# Patient Record
Sex: Male | Born: 1935 | Marital: Married | Smoking: Never smoker
Health system: Southern US, Community
[De-identification: ages and names within clinical notes are randomized; demographics above are authoritative.]

---

## 2019-05-20 ENCOUNTER — Other Ambulatory Visit: Payer: Self-pay | Admitting: General Practice

## 2019-05-20 NOTE — Patient Outreach (Signed)
Client is newly enrolled in the Special Needs Plan program with Type II Diabetes. Individualized Care Plan (ICP) completed with information from the Health Risk Assessment on file, no record results found in this EMR or other sources. Client unable to be reached with three telephonic attempts. Client also has a history of Atrial Fibrillation and Hypertension. Client risk assessment reports no ED or acute inpatient visits recently. Will send an introductory letter with ICP to the primary provider and client, along with educational materials. Assigned RN Care Coordinator will follow up within 3 months.

## 2019-07-16 ENCOUNTER — Other Ambulatory Visit: Payer: Self-pay | Admitting: *Deleted

## 2019-07-16 NOTE — Patient Outreach (Signed)
  [facility]) Care Management Chronic Special Needs Program    07/16/2019  Name: Andre Adams, DOB: 1933-08-10  MRN: [id number]   Mr. Bryton Romagnoli is enrolled in a chronic special needs plan for Diabetes.  Outreach call to client for initial telephone assessment, no answer to telephone, left voicemail requestsing return phone call.   PLAN Outreach client in 2-3 weeks  Irving Shows [facility], BSN [facility] Care Coordinator [phone]

## 2019-07-23 ENCOUNTER — Other Ambulatory Visit: Payer: Self-pay | Admitting: *Deleted

## 2019-07-23 NOTE — Patient Outreach (Signed)
  [facility]) Care Management Chronic Special Needs Program    07/23/2019  Name: Andre Adams, DOB: Aug 21, 1933  MRN: [id number]   Mr. Andre Adams is enrolled in a chronic special needs plan for Diabetes.  Outreach call to client for initial telephone assessment/ 2nd attempt, no answer to telephone, left voicemail requesting return phone call.  PLAN Outreach client within 2-3 weeks  Irving Shows [facility], BSN [facility] RN Care Coordinator, CSNP [phone]

## 2019-07-25 ENCOUNTER — Other Ambulatory Visit: Payer: Self-pay | Admitting: *Deleted

## 2019-07-25 NOTE — Patient Outreach (Signed)
  [facility]) Care Management Chronic Special Needs Program    07/25/2019  Name: Andre Adams, DOB: 1933/11/08  MRN: [id number]   Mr. Andre Adams is enrolled in a chronic special needs plan for Diabetes.  Outreach call to client for initial telephone assessment/  3rd attempt, no answer to telephone, left voicemail requesting return phone call.  RN care manager mailed unsuccessful outreach letter to client's home.  PLAN Outreach client in 6-9 months  Andre Adams [facility], BSN [facility] RN Care Coordinator, CSNP [phone]

## 2019-09-24 ENCOUNTER — Other Ambulatory Visit: Payer: Self-pay | Admitting: *Deleted

## 2019-09-24 NOTE — Patient Outreach (Signed)
  [facility]) Care Management Chronic Special Needs Program    09/24/2019  Name: Andre Adams, DOB: [birth year]  MRN: [id number]   [name] is enrolled in a chronic special needs plan for Diabetes.  RN care manager received notification client is admitted to [facility] on 09/23/19 with unspecified injury of left wrist, hand, finger(s).  Per policy and procedure, individualized care plan sent to [facility] utilization management department.  Goals    . Client understands the importance of follow-up with providers by attending scheduled visits    . Client will report no worsening of symptoms of Atrial Fibrillation within the next months     RN provided [employer] Education on "Living with Atrial Fibrillation". Review Atrial Fibrillation action plan in the [employer] education calendar you received in the mail. Review signs, symptoms and monitoring for worsening Atrial Fibrillation. Review importance of taking anticoagulant medication (Coumadin) to prevent stroke.  Call your doctor if you have feelings of fast or irregular heartbeats.     . Client will use Assistive Devices as needed and verbalize understanding of device use     Continue to use your cane or walker as needed for stability while walking. RN provided [employer] on "Preventing Falls in the Older Adult". Report any falls you have to your doctor or RN with telephone calls.     . Client will verbalize knowledge of self management of Hypertension as evidences by BP reading of 140/90 or less; or as defined by provider     RN provided [facility] Education on "Diabetes and Blood Pressure". Check your blood pressure on a regular basis. Notify the RN if you need a new B/P monitor.    Marland Kitchen HEMOGLOBIN A1C < 7.0    . Maintain timely refills of diabetic medication as prescribed within the year .    Marland Kitchen Obtain annual   Lipid Profile, LDL-C    . Obtain Annual Eye (retinal)  Exam     . Obtain Annual Foot Exam    . Obtain annual screen for micro albuminuria (urine) , nephropathy (kidney problems)    . Obtain Hemoglobin A1C at least 2 times per year    . Visit Primary Care Provider or Endocrinologist at least 2 times per year        PLAN RN care manager will notify [facility] care management hospital liason of client's admission and request follow up on client's discharge disposition.  RN care manager will continue to follow as client's CSNP care management coordinator.    Irving Shows [facility], BSN [facility] RN Care Coordinator, CSNP [phone]

## 2019-09-30 ENCOUNTER — Other Ambulatory Visit: Payer: Self-pay | Admitting: *Deleted

## 2019-09-30 NOTE — Patient Outreach (Signed)
  [facility]) Care Management Chronic Special Needs Program  09/30/2019  Name: Andre Adams DOB: Feb 27, 1933  MRN: [id number]  Mr. Andre Adams is enrolled in a chronic special needs plan for Diabetes. Reviewed and updated care plan.  Subjective: Client hospitalized 09/23/19-09/29/19 at [facility] with acute fractures bil pubic bones, left acetablulum and left distal radius, client had confusion (most probable vascular). Recommendation from PT/ OT client discharge to skilled nursing facility for rehab.  Goals Addressed            This Visit's Progress   . General - Client will not be readmitted within 30 days (C-SNP)       Please follow discharge instructions  Call provider if you have any questions Attend all follow up appointments as scheduled Take medications as prescribed Call 24 hour nurse advice line as needed at [phone]       Plan:    RN care manager faxed today's note and updated individualized care plan to primary care provider, mailed updated individualized care plan to client.  Chronic care management coordinator will outreach in:  When client discharges from skilled nursing facility.  Audrie Gallus  [facility] Case Manager, C-SNP  [phone]  .

## 2019-10-03 ENCOUNTER — Other Ambulatory Visit: Payer: Self-pay

## 2019-10-03 NOTE — Patient Outreach (Signed)
°  [facility]) Care Management Chronic Special Needs Program    10/03/2019  Name: Andre Adams, DOB: 03-10-1933  MRN: [id number]   Mr. Andre Adams is enrolled in a chronic special needs plan for Diabetes.  Client admitted to [facility] on 10/03/19 with dx of GI bleed. Individualized care plan sent to [facility] hospital for admission  Assigned RNCM will continue to follow post discharge.  Dudley Major RN, Maximiano Coss, CDE Chronic Care Management Coordinator [facility] Care Management [phone]

## 2019-10-08 ENCOUNTER — Other Ambulatory Visit: Payer: Self-pay | Admitting: *Deleted

## 2019-10-08 NOTE — Patient Outreach (Signed)
  [facility]) Care Management Chronic Special Needs Program   10/08/2019  Name: Danna Casella, DOB: [birth year]  MRN: [id number]  The client was discussed in today's interdisciplinary care team meeting.  The following issues were discussed:  Client's needs, changes in health status, key risk triggers/ risk stratification, care plan, coordination of care and care transition.   Participants present:                                    Livia Snellen BSN, MS, CCM                                     Davina Green BSN, CCM                                     Melissa Sandlin BSN, CCM, CDE                                     Kathyrn Sheriff BSN, MSN, CCM                                     Iverson Alamin CBCC/ CMAA                                     Dr. Abner Greenspan                                     Shara Blazing RN MSN-[facility]                                     Dessa Phi RN- [facility]                                     Caryn Bee Ruedinger  Pharm D HTA                                     Mosie Lukes CDE, Health Coach HTA  Recommendations:  None  Plan:  Continue to follow/ Tier 2  Follow-up: Upon discharge from skilled nursing facility  Irving Shows [facility], BSN [facility] RN Care Coordinator, CSNP [phone]

## 2019-10-10 ENCOUNTER — Other Ambulatory Visit: Payer: Self-pay | Admitting: *Deleted

## 2019-10-10 NOTE — Patient Outreach (Signed)
  [facility]) Care Management Chronic Special Needs Program  10/10/2019  Name: Izack Hoogland DOB: 11/11/1933  MRN: [id number]  Mr. Giovonnie Trettel is enrolled in a chronic special needs plan for Diabetes. Reviewed and updated care plan.  Subjective: Client admitted hospital [facility]) on 10/03/19 with GI bleed, hemorrhagic shock, A-fib RVR, lactic acidosis.  Discharged to [facility] on 10/09/19 for completion of short term rehab.   Goals    . Client understands the importance of follow-up with providers by attending scheduled visits    . Client will report no worsening of symptoms of Atrial Fibrillation within the next months     RN provided [employer] Education on "Living with Atrial Fibrillation". Review Atrial Fibrillation action plan in the [employer] education calendar you received in the mail. Review signs, symptoms and monitoring for worsening Atrial Fibrillation. Review importance of taking anticoagulant medication (Coumadin) to prevent stroke.  Call your doctor if you have feelings of fast or irregular heartbeats.     . Client will use Assistive Devices as needed and verbalize understanding of device use     Continue to use your cane or walker as needed for stability while walking. RN provided [employer] on "Preventing Falls in the Older Adult". Report any falls you have to your doctor or RN with telephone calls.     . Client will verbalize knowledge of self management of Hypertension as evidences by BP reading of 140/90 or less; or as defined by provider     RN provided [facility] Education on "Diabetes and Blood Pressure". Check your blood pressure on a regular basis. Notify the RN if you need a new B/P monitor.    . General - Client will not be readmitted within 30 days (C-SNP)     Please follow discharge instructions  Call provider if you have any questions Attend all follow up appointments as scheduled Take medications as prescribed Call 24  hour nurse advice line as needed at [phone]    . HEMOGLOBIN A1C < 7.0    . Maintain timely refills of diabetic medication as prescribed within the year .    Marland Kitchen Obtain annual  Lipid Profile, LDL-C    . Obtain Annual Eye (retinal)  Exam     . Obtain Annual Foot Exam    . Obtain annual screen for micro albuminuria (urine) , nephropathy (kidney problems)    . Obtain Hemoglobin A1C at least 2 times per year    . Visit Primary Care Provider or Endocrinologist at least 2 times per year       Plan:    Individualized care plan updated and sent to client and primary care provider.  RN care manager will continue to follow and collaborate/ care coordinate as needed.  Chronic care management coordinator will outreach in:  Upon discharge from skilled nursing facility.    Audrie Gallus  [facility] Case Manager, C-SNP  [phone] .

## 2019-10-23 ENCOUNTER — Other Ambulatory Visit: Payer: Self-pay | Admitting: *Deleted

## 2019-10-23 NOTE — Patient Outreach (Signed)
°  [facility]) Care Management Chronic Special Needs Program    10/23/2019  Name: Andre Adams, DOB: 10/17/1933  MRN: [id number]   [name] is enrolled in a chronic special needs plan for Diabetes.  RN care manager received notification client is admitted to [facility] on 10/23/19 with sepsis.  Per policy and procedure, individualized care plan sent to [facility] Utilization Management.    PLAN RN care manager will continue to follow as client's CSNP care management coordinator.  (No hospital liason at [facility]).   Irving Shows [facility], BSN [facility] RN Care Coordinator, CSNP [phone]

## 2019-10-31 ENCOUNTER — Other Ambulatory Visit: Payer: Self-pay | Admitting: *Deleted

## 2019-10-31 NOTE — Patient Outreach (Signed)
[facility]) Care Management Chronic Special Needs Program  10/31/2019  Name: Andre Adams DOB: Apr 04, 1933  MRN: [id number]  Andre Adams is enrolled in a chronic special needs plan for Diabetes. Reviewed and updated care plan.  Subjective: Client admitted to hospital [facility]) on 10/23/19 with dehydration, AKI, pneumonia LLL, sepsis. Discharged to skilled nursing facility [facility] on 10/30/19.  Goals    . Client understands the importance of follow-up with providers by attending scheduled visits     Please follow up with health care providers post hospital    . Client will report no worsening of symptoms of Atrial Fibrillation within the next months     Review Atrial Fibrillation action plan in the [employer] education calendar you received in the mail. Review signs, symptoms and monitoring for worsening Atrial Fibrillation. Review importance of taking anticoagulant medication (Coumadin) to prevent stroke.  Call your doctor if you have feelings of fast or irregular heartbeats.     . Client will use Assist[employer] Devices as needed and verbalize understanding of device use     Continue to use your cane or walker as needed for stability while walking. RN provided [employer] on "Preventing Falls in the Older Adult". Report any falls you have to your doctor or RN with telephone calls.     . Client will verbalize knowledge of self management of Hypertension as evidences by BP reading of 140/90 or less; or as defined by provider     Plan to check blood pressure regularly.  If you do not have a B/P monitor (cuff), one can be provided to you.  Write results in your [employer] calendar (in the back section). Reviewed blood pressure medication from EMR. Take B/P medications as ordered.  Some may cause you to use the bathroom more. Plan to eat low salt and heart healthy meals full of fruits, vegetables, whole grains, lean protein and limit  fat and sugars. Increase activity as tolerated.      . General - Client will not be readmitted within 30 days (C-SNP)     Please follow discharge instructions  Call provider if you have any questions Attend all follow up appointments as scheduled Take medications as prescribed Call 24 hour nurse advice line as needed at [phone]     . HEMOGLOBIN A1C < 7     Have your [facility] checked every 6 months if you are at goal or every 3 months if you are not at goal. Check blood sugars daily before eating with goal of 80-130.  You can also check 1 1/2 hours after eating with goal of 180 or less. Plan to eat low carbohydrate and low salt meals, watch portion sizes and avoid sugar sweetened drinks.   Review [employer] calendar (sent in the mail) for diabetes action plan in the back. Increase activity only if you are able to do it.  Follow doctor recommendations.       . Maintain timely refills of diabetic medication as prescribed within the year .     Contact your RN care manager if you have questions about medicines     . Obtain annual  Lipid Profile, LDL-C     The goal for LDL is less than 70mg /dl as you are at high risk for complications. Try to avoid saturated fats, trans-fats and eat more fiber. Plan to take statin (cholesterol) medicine as ordered.     . Obtain Annual Eye (retinal)  Exam  Diabetes can affect your vision.  Plan to have a dilated eye exam every year.      . Obtain Annual Foot Exam     Your doctor should check your bare feet at each visit. Diabetes can affect the nerves in your feet, causing decreased feeling or numbness. Check your feet and in-between toes daily for cuts, bruises, redness, blisters or sores.  If you cannot reach them, use a mirror. Wash feet with soap and water, dry feet well especially between toes.  Don't use too much lotion. Wear shoes that are not too tight and don't walk barefoot.      . Obtain annual screen for micro  albuminuria (urine) , nephropathy (kidney problems)     Diabetes can affect your kidneys. It is important for your doctor to check your urine at least once a year  These tests show how your kidneys are working.     . Obtain Hemoglobin A1C at least 2 times per year     Have AIC checked at least twice yearly    . Visit Primary Care Provider or Endocrinologist at least 2 times per year      Continue to follow up with health care providers for assessment and labwork       Plan:    Individualized care plan updated and sent to client and primary care provider.  RN care manager will continue to follow and collaborate/ care coordinate as needed.  Chronic care management coordinator will outreach in:  Upon discharge from skilled nursing facility    Audrie Gallus  [facility] Case Manager, C-SNP  [phone]  .

## 2019-11-12 ENCOUNTER — Other Ambulatory Visit: Payer: Self-pay | Admitting: *Deleted

## 2019-11-12 NOTE — Patient Outreach (Signed)
[facility]) Care Management Chronic Special Needs Program    11/12/2019  Name: Andre Adams, DOB: [birth year]  MRN: [id number]   Mr. Theseus Birnie is enrolled in a chronic special needs plan for Diabetes.  The client was discussed in today's interdisciplinary care team meeting.  The following issues were discussed:  Client's needs, changes in health status, key risk triggers/ risk stratification, care plan, coordination of care and care transition.    Participants present:                                     Livia Snellen BSN, MS, CCM                                     Davina Green BSN, CCM                                     Melissa Sandlin BSN, CCM, CDE                                     Kathyrn Sheriff BSN, MSN, CCM                                     Iverson Alamin CBCC/ CMAA                                     Dr. Charlott Rakes                                     Ginnie Smart, Director of Quality, HTA                                     Terance Hart, Emergency planning/management officer, CSNP/ HTA                                     Dessa Phi RN- [facility]                                     Theodosia Paling, Clinical Quality Mgr HTA                                     Marciano Sequin, RN, BSN                                     Westley Chandler, RN, BSN  Mosie Lukes CDE, Health Coach HTA                                     Irving Shows RNC, BSN  Recommendations:  None  PLAN-Continue to follow/ Tier 3  Follow up: Upon discharge from skilled nursing facility  Irving Shows [facility], BSN [facility] RN Care Coordinator, CSNP [phone]

## 2019-12-04 ENCOUNTER — Other Ambulatory Visit: Payer: Self-pay | Admitting: *Deleted

## 2019-12-04 NOTE — Patient Outreach (Signed)
c [facility]) Care Management Chronic Special Needs Program    12/04/2019  Name: Gorje Iyer, DOB: Sep 03, 1933  MRN: [id number]   Mr. Nina Hoar is enrolled in a chronic special needs plan for Diabetes.  Client's wife requested return phone call, RN care manager spoke with client's spouse Steward Drone who reports client continues to reside at skilled nursing facility and will most likely remain there as "he can't even go the bathroom by himself"  Spouse states she has EMS bill and bill for medications from the skilled nursing facility and is asking about benefits and does HTA pay for these bills.  RN care manager directed spouse to call HTA conciegre number and spouse verbalizes understanding that she is able to do that.  RN care manager asked spouse to call RN care manager back if any further questions or concerns.  Irving Shows [facility], BSN [facility] RN Care Coordinator, CSNP [phone]

## 2020-01-29 ENCOUNTER — Other Ambulatory Visit: Payer: Self-pay | Admitting: *Deleted

## 2020-01-29 NOTE — Patient Outreach (Signed)
  [facility]) Care Management Chronic Special Needs Program    01/29/2020  Name: Andre Adams, DOB: 09-01-1933  MRN: [id number]   [name] is enrolled in a chronic special needs plan for Diabetes.  [employer] care management team has assumed care and services for this member.  Case closed by [facility] care management.   Irving Shows [facility], BSN [facility] RN Care Coordinator, CSNP [phone]

## 2020-04-15 ENCOUNTER — Ambulatory Visit: Payer: HMO | Admitting: *Deleted

## 2020-09-11 ENCOUNTER — Encounter (HOSPITAL_COMMUNITY)
Admission: EM | Disposition: A | Discharge status: Hospice | Payer: Self-pay | Source: Home / Self Care | Attending: Neurology

## 2020-09-11 ENCOUNTER — Emergency Department (HOSPITAL_COMMUNITY): Payer: HMO

## 2020-09-11 ENCOUNTER — Ambulatory Visit (HOSPITAL_COMMUNITY)
Admission: RE | Admit: 2020-09-11 | Discharge: 2020-09-11 | Disposition: A | Discharge status: Home / Self Care | Payer: HMO | Source: Ambulatory Visit | Attending: Interventional Radiology | Admitting: Interventional Radiology

## 2020-09-11 ENCOUNTER — Encounter (HOSPITAL_COMMUNITY): Payer: Self-pay | Admitting: Registered Nurse

## 2020-09-11 ENCOUNTER — Other Ambulatory Visit (HOSPITAL_COMMUNITY): Payer: Self-pay | Admitting: Interventional Radiology

## 2020-09-11 ENCOUNTER — Emergency Department (HOSPITAL_COMMUNITY): Payer: HMO | Admitting: Registered Nurse

## 2020-09-11 ENCOUNTER — Inpatient Hospital Stay (HOSPITAL_COMMUNITY)
Admission: EM | Admit: 2020-09-11 | Discharge: 2020-09-26 | DRG: 023 | Disposition: A | Discharge status: Hospice | Payer: HMO | Attending: Neurology | Admitting: Neurology

## 2020-09-11 ENCOUNTER — Inpatient Hospital Stay (HOSPITAL_COMMUNITY): Payer: HMO

## 2020-09-11 ENCOUNTER — Encounter: Admission: RE | Payer: Self-pay | Source: Ambulatory Visit

## 2020-09-11 ENCOUNTER — Inpatient Hospital Stay: Admission: RE | Admit: 2020-09-11 | Payer: HMO | Source: Ambulatory Visit | Admitting: Interventional Radiology

## 2020-09-11 DIAGNOSIS — I63511 Cerebral infarction due to unspecified occlusion or stenosis of right middle cerebral artery: Secondary | ICD-10-CM | POA: Diagnosis present

## 2020-09-11 DIAGNOSIS — I639 Cerebral infarction, unspecified: Secondary | ICD-10-CM | POA: Diagnosis not present

## 2020-09-11 DIAGNOSIS — J9601 Acute respiratory failure with hypoxia: Secondary | ICD-10-CM

## 2020-09-11 DIAGNOSIS — I482 Chronic atrial fibrillation, unspecified: Secondary | ICD-10-CM | POA: Diagnosis not present

## 2020-09-11 DIAGNOSIS — R062 Wheezing: Secondary | ICD-10-CM

## 2020-09-11 DIAGNOSIS — Z978 Presence of other specified devices: Secondary | ICD-10-CM | POA: Diagnosis not present

## 2020-09-11 DIAGNOSIS — Z4659 Encounter for fitting and adjustment of other gastrointestinal appliance and device: Secondary | ICD-10-CM

## 2020-09-11 DIAGNOSIS — N179 Acute kidney failure, unspecified: Secondary | ICD-10-CM

## 2020-09-11 DIAGNOSIS — R9389 Abnormal findings on diagnostic imaging of other specified body structures: Secondary | ICD-10-CM

## 2020-09-11 DIAGNOSIS — Z66 Do not resuscitate: Secondary | ICD-10-CM

## 2020-09-11 DIAGNOSIS — Z515 Encounter for palliative care: Secondary | ICD-10-CM

## 2020-09-11 DIAGNOSIS — I69391 Dysphagia following cerebral infarction: Secondary | ICD-10-CM | POA: Diagnosis not present

## 2020-09-11 DIAGNOSIS — S41112A Laceration without foreign body of left upper arm, initial encounter: Secondary | ICD-10-CM | POA: Diagnosis present

## 2020-09-11 DIAGNOSIS — J9811 Atelectasis: Secondary | ICD-10-CM | POA: Diagnosis present

## 2020-09-11 DIAGNOSIS — N183 Chronic kidney disease, stage 3 unspecified: Secondary | ICD-10-CM | POA: Diagnosis not present

## 2020-09-11 DIAGNOSIS — S0990XA Unspecified injury of head, initial encounter: Secondary | ICD-10-CM | POA: Diagnosis present

## 2020-09-11 DIAGNOSIS — E1165 Type 2 diabetes mellitus with hyperglycemia: Secondary | ICD-10-CM | POA: Diagnosis present

## 2020-09-11 DIAGNOSIS — I6523 Occlusion and stenosis of bilateral carotid arteries: Secondary | ICD-10-CM | POA: Diagnosis present

## 2020-09-11 DIAGNOSIS — S52202D Unspecified fracture of shaft of left ulna, subsequent encounter for closed fracture with routine healing: Secondary | ICD-10-CM

## 2020-09-11 DIAGNOSIS — E785 Hyperlipidemia, unspecified: Secondary | ICD-10-CM | POA: Diagnosis present

## 2020-09-11 DIAGNOSIS — I129 Hypertensive chronic kidney disease with stage 1 through stage 4 chronic kidney disease, or unspecified chronic kidney disease: Secondary | ICD-10-CM | POA: Diagnosis present

## 2020-09-11 DIAGNOSIS — N1832 Chronic kidney disease, stage 3b: Secondary | ICD-10-CM | POA: Diagnosis present

## 2020-09-11 DIAGNOSIS — G3184 Mild cognitive impairment, so stated: Secondary | ICD-10-CM | POA: Diagnosis present

## 2020-09-11 DIAGNOSIS — Z794 Long term (current) use of insulin: Secondary | ICD-10-CM

## 2020-09-11 DIAGNOSIS — I651 Occlusion and stenosis of basilar artery: Secondary | ICD-10-CM | POA: Diagnosis present

## 2020-09-11 DIAGNOSIS — I959 Hypotension, unspecified: Secondary | ICD-10-CM | POA: Diagnosis present

## 2020-09-11 DIAGNOSIS — Z20822 Contact with and (suspected) exposure to covid-19: Secondary | ICD-10-CM | POA: Diagnosis present

## 2020-09-11 DIAGNOSIS — E872 Acidosis: Secondary | ICD-10-CM | POA: Diagnosis present

## 2020-09-11 DIAGNOSIS — Z79899 Other long term (current) drug therapy: Secondary | ICD-10-CM

## 2020-09-11 DIAGNOSIS — I63421 Cerebral infarction due to embolism of right anterior cerebral artery: Secondary | ICD-10-CM

## 2020-09-11 DIAGNOSIS — Z23 Encounter for immunization: Secondary | ICD-10-CM

## 2020-09-11 DIAGNOSIS — R54 Age-related physical debility: Secondary | ICD-10-CM | POA: Diagnosis present

## 2020-09-11 DIAGNOSIS — M8580 Other specified disorders of bone density and structure, unspecified site: Secondary | ICD-10-CM | POA: Diagnosis present

## 2020-09-11 DIAGNOSIS — R5381 Other malaise: Secondary | ICD-10-CM | POA: Diagnosis not present

## 2020-09-11 DIAGNOSIS — E1122 Type 2 diabetes mellitus with diabetic chronic kidney disease: Secondary | ICD-10-CM | POA: Diagnosis present

## 2020-09-11 DIAGNOSIS — Y92129 Unspecified place in nursing home as the place of occurrence of the external cause: Secondary | ICD-10-CM | POA: Diagnosis not present

## 2020-09-11 DIAGNOSIS — H0520 Unspecified exophthalmos: Secondary | ICD-10-CM | POA: Diagnosis present

## 2020-09-11 DIAGNOSIS — R1310 Dysphagia, unspecified: Secondary | ICD-10-CM | POA: Diagnosis present

## 2020-09-11 DIAGNOSIS — R29723 NIHSS score 23: Secondary | ICD-10-CM | POA: Diagnosis present

## 2020-09-11 DIAGNOSIS — R531 Weakness: Secondary | ICD-10-CM | POA: Diagnosis not present

## 2020-09-11 DIAGNOSIS — I6389 Other cerebral infarction: Secondary | ICD-10-CM | POA: Diagnosis not present

## 2020-09-11 DIAGNOSIS — G8194 Hemiplegia, unspecified affecting left nondominant side: Secondary | ICD-10-CM | POA: Diagnosis present

## 2020-09-11 DIAGNOSIS — W06XXXA Fall from bed, initial encounter: Secondary | ICD-10-CM | POA: Diagnosis present

## 2020-09-11 DIAGNOSIS — Z638 Other specified problems related to primary support group: Secondary | ICD-10-CM | POA: Diagnosis not present

## 2020-09-11 DIAGNOSIS — Z8719 Personal history of other diseases of the digestive system: Secondary | ICD-10-CM | POA: Diagnosis not present

## 2020-09-11 DIAGNOSIS — Z888 Allergy status to other drugs, medicaments and biological substances status: Secondary | ICD-10-CM

## 2020-09-11 DIAGNOSIS — R471 Dysarthria and anarthria: Secondary | ICD-10-CM | POA: Diagnosis present

## 2020-09-11 DIAGNOSIS — G9340 Encephalopathy, unspecified: Secondary | ICD-10-CM | POA: Diagnosis present

## 2020-09-11 DIAGNOSIS — R29810 Facial weakness: Secondary | ICD-10-CM | POA: Diagnosis present

## 2020-09-11 DIAGNOSIS — S52509D Unspecified fracture of the lower end of unspecified radius, subsequent encounter for closed fracture with routine healing: Secondary | ICD-10-CM

## 2020-09-11 DIAGNOSIS — Z7189 Other specified counseling: Secondary | ICD-10-CM | POA: Diagnosis not present

## 2020-09-11 DIAGNOSIS — R296 Repeated falls: Secondary | ICD-10-CM | POA: Diagnosis present

## 2020-09-11 DIAGNOSIS — G9341 Metabolic encephalopathy: Secondary | ICD-10-CM | POA: Diagnosis not present

## 2020-09-11 HISTORY — PX: RADIOLOGY WITH ANESTHESIA: SHX6223

## 2020-09-11 HISTORY — PX: IR CT HEAD LTD: IMG2386

## 2020-09-11 HISTORY — PX: IR PERCUTANEOUS ART THROMBECTOMY/INFUSION INTRACRANIAL INC DIAG ANGIO: IMG6087

## 2020-09-11 LAB — PROTIME-INR
INR: 1.1 (ref 0.8–1.2)
Prothrombin Time: 14.1 seconds (ref 11.4–15.2)

## 2020-09-11 LAB — COMPREHENSIVE METABOLIC PANEL
ALT: 10 U/L (ref 0–44)
AST: 18 U/L (ref 15–41)
Albumin: 3.2 g/dL — ABNORMAL LOW (ref 3.5–5.0)
Alkaline Phosphatase: 87 U/L (ref 38–126)
Anion gap: 12 (ref 5–15)
BUN: 44 mg/dL — ABNORMAL HIGH (ref 8–23)
CO2: 21 mmol/L — ABNORMAL LOW (ref 22–32)
Calcium: 9.1 mg/dL (ref 8.9–10.3)
Chloride: 103 mmol/L (ref 98–111)
Creatinine, Ser: 2.57 mg/dL — ABNORMAL HIGH (ref 0.61–1.24)
GFR, Estimated: 24 mL/min — ABNORMAL LOW (ref >60)
Glucose, Bld: 279 mg/dL — ABNORMAL HIGH (ref 70–99)
Potassium: 4.4 mmol/L (ref 3.5–5.1)
Sodium: 136 mmol/L (ref 135–145)
Total Bilirubin: 0.9 mg/dL (ref 0.3–1.2)
Total Protein: 6.3 g/dL — ABNORMAL LOW (ref 6.5–8.1)

## 2020-09-11 LAB — CBG MONITORING, ED: Glucose-Capillary: 274 mg/dL — ABNORMAL HIGH (ref 70–99)

## 2020-09-11 LAB — URINALYSIS, ROUTINE W REFLEX MICROSCOPIC
Bacteria, UA: NONE SEEN
Bilirubin Urine: NEGATIVE
Glucose, UA: >=500 mg/dL — AB
Ketones, ur: NEGATIVE mg/dL
Nitrite: NEGATIVE
Protein, ur: 100 mg/dL — AB
Specific Gravity, Urine: 1.023 (ref 1.005–1.030)
pH: 6 (ref 5.0–8.0)

## 2020-09-11 LAB — POCT I-STAT 7, (LYTES, BLD GAS, ICA,H+H)
Acid-base deficit: 6 mmol/L — ABNORMAL HIGH (ref 0.0–2.0)
Bicarbonate: 18.6 mmol/L — ABNORMAL LOW (ref 20.0–28.0)
Calcium, Ion: 1.17 mmol/L (ref 1.15–1.40)
HCT: 39 % (ref 39.0–52.0)
Hemoglobin: 13.3 g/dL (ref 13.0–17.0)
O2 Saturation: 99 %
Potassium: 4.2 mmol/L (ref 3.5–5.1)
Sodium: 139 mmol/L (ref 135–145)
TCO2: 20 mmol/L — ABNORMAL LOW (ref 22–32)
pCO2 arterial: 31.9 mmHg — ABNORMAL LOW (ref 32.0–48.0)
pH, Arterial: 7.374 (ref 7.350–7.450)
pO2, Arterial: 129 mmHg — ABNORMAL HIGH (ref 83.0–108.0)

## 2020-09-11 LAB — I-STAT CHEM 8, ED
BUN: 44 mg/dL — ABNORMAL HIGH (ref 8–23)
Calcium, Ion: 1.08 mmol/L — ABNORMAL LOW (ref 1.15–1.40)
Chloride: 105 mmol/L (ref 98–111)
Creatinine, Ser: 2.5 mg/dL — ABNORMAL HIGH (ref 0.61–1.24)
Glucose, Bld: 272 mg/dL — ABNORMAL HIGH (ref 70–99)
HCT: 50 % (ref 39.0–52.0)
Hemoglobin: 17 g/dL (ref 13.0–17.0)
Potassium: 4.2 mmol/L (ref 3.5–5.1)
Sodium: 136 mmol/L (ref 135–145)
TCO2: 19 mmol/L — ABNORMAL LOW (ref 22–32)

## 2020-09-11 LAB — RESP PANEL BY RT-PCR (FLU A&B, COVID) ARPGX2
Influenza A by PCR: NEGATIVE
Influenza B by PCR: NEGATIVE
SARS Coronavirus 2 by RT PCR: NEGATIVE

## 2020-09-11 LAB — SAMPLE TO BLOOD BANK

## 2020-09-11 LAB — ETHANOL: Alcohol, Ethyl (B): <10 mg/dL (ref <10)

## 2020-09-11 LAB — LACTIC ACID, PLASMA: Lactic Acid, Venous: 3 mmol/L (ref 0.5–1.9)

## 2020-09-11 LAB — APTT: aPTT: 30 seconds (ref 24–36)

## 2020-09-11 LAB — GLUCOSE, CAPILLARY: Glucose-Capillary: 254 mg/dL — ABNORMAL HIGH (ref 70–99)

## 2020-09-11 IMAGING — DX DG CHEST 1V PORT
1 series · 1 of 1 positions shown · non-contrast
Comparison: [DATE] [DATE], [DATE], [DATE] [DATE], [DATE]

CLINICAL DATA: trauma

EXAM:
PORTABLE CHEST 1 VIEW

[chest]
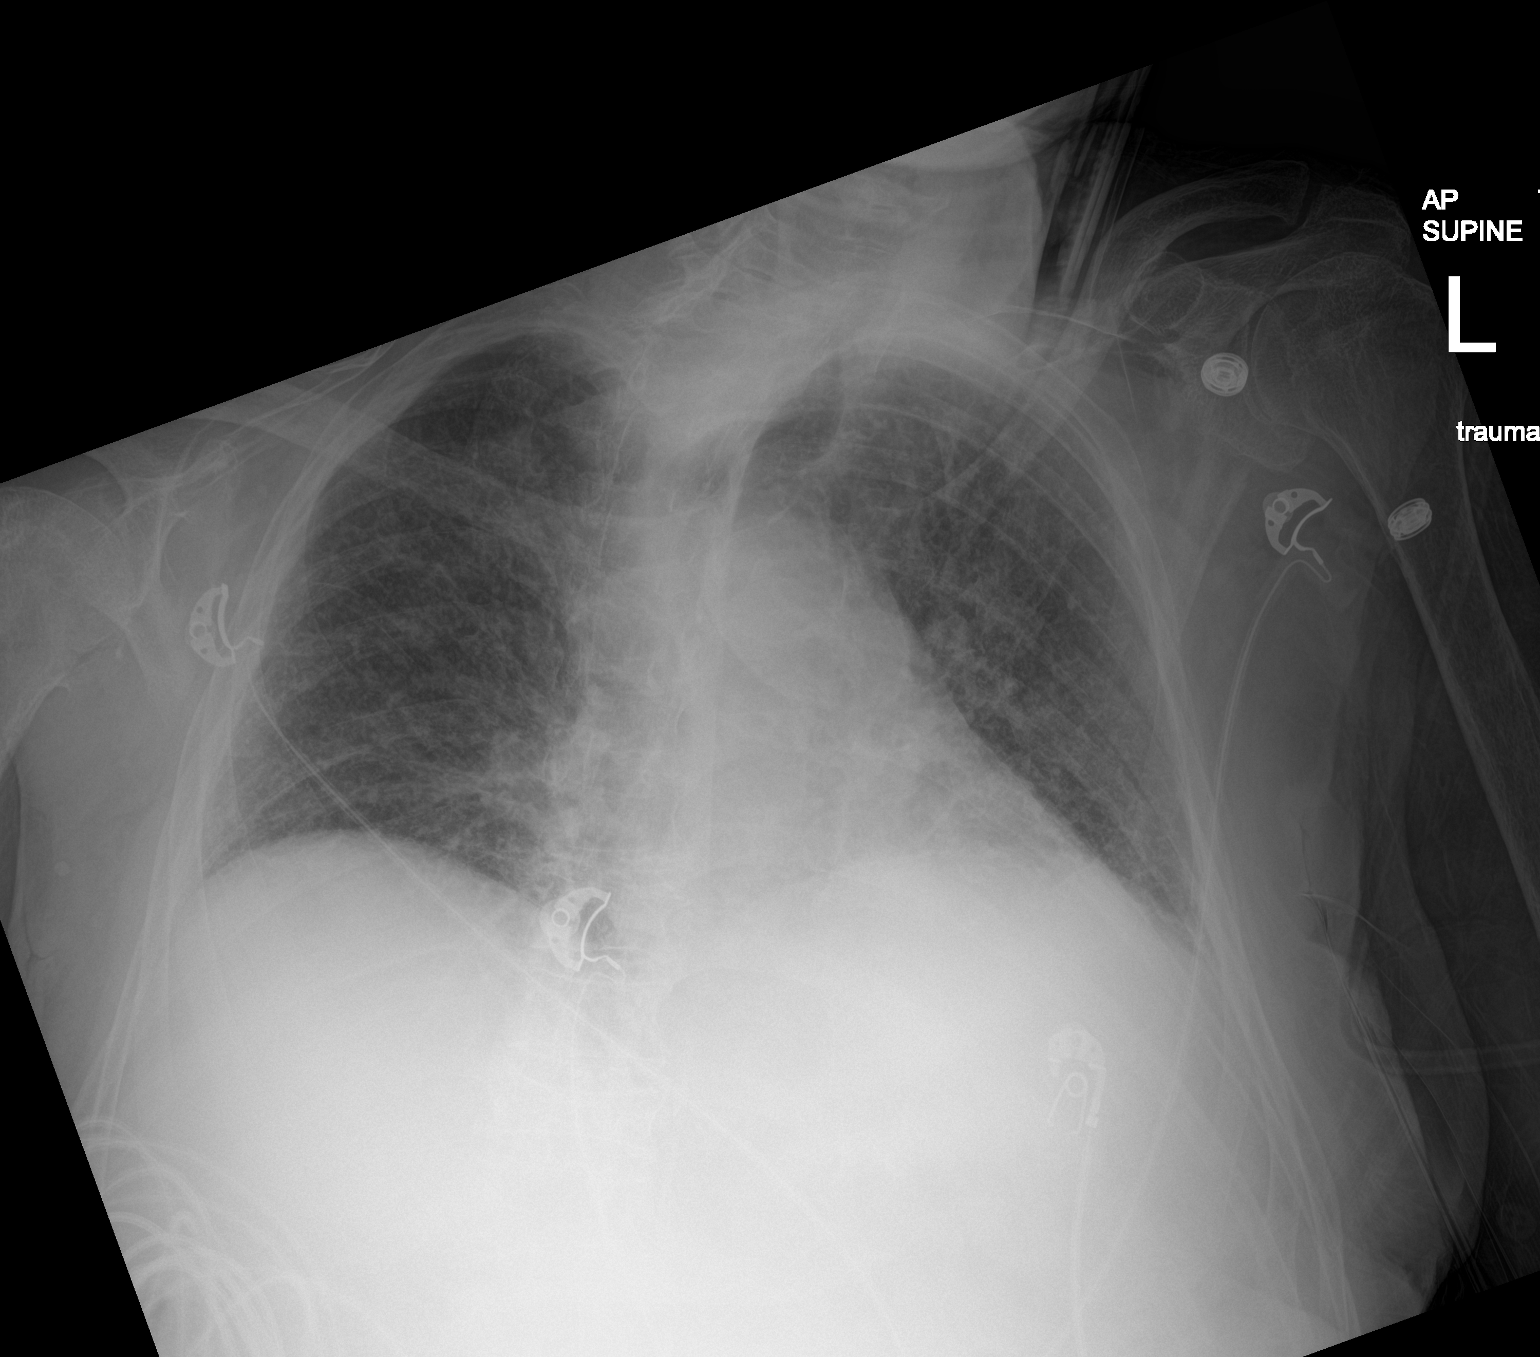

[1 of 1 positions shown; findings below may reference images not displayed]

FINDINGS: Evaluation is limited secondary to patient rotation. The
cardiomediastinal silhouette is grossly unchanged in contour. No
pleural effusion. No pneumothorax. Diffuse interstitial prominence.
Visualized abdomen is unremarkable. Compression fracture deformity
of the T11, unchanged in comparison to prior. Compression fracture
of T9 appears similar comparison to prior.
IMPRESSION: Diffuse interstitial prominence likely reflecting underlying
pulmonary edema. Differential considerations include atypical
infection.

## 2020-09-11 IMAGING — DX DG WRIST COMPLETE 3+V*L*
1 series · 3 of 3 positions shown · non-contrast
Comparison: [DATE] common [DATE]

CLINICAL DATA: fall

EXAM:
LEFT HAND - COMPLETE 3+ VIEW; LEFT WRIST - COMPLETE 3+ VIEW

[Series 1: wrist · 0.14mm/px · 3 of 3 slices shown]
[im 1/3]
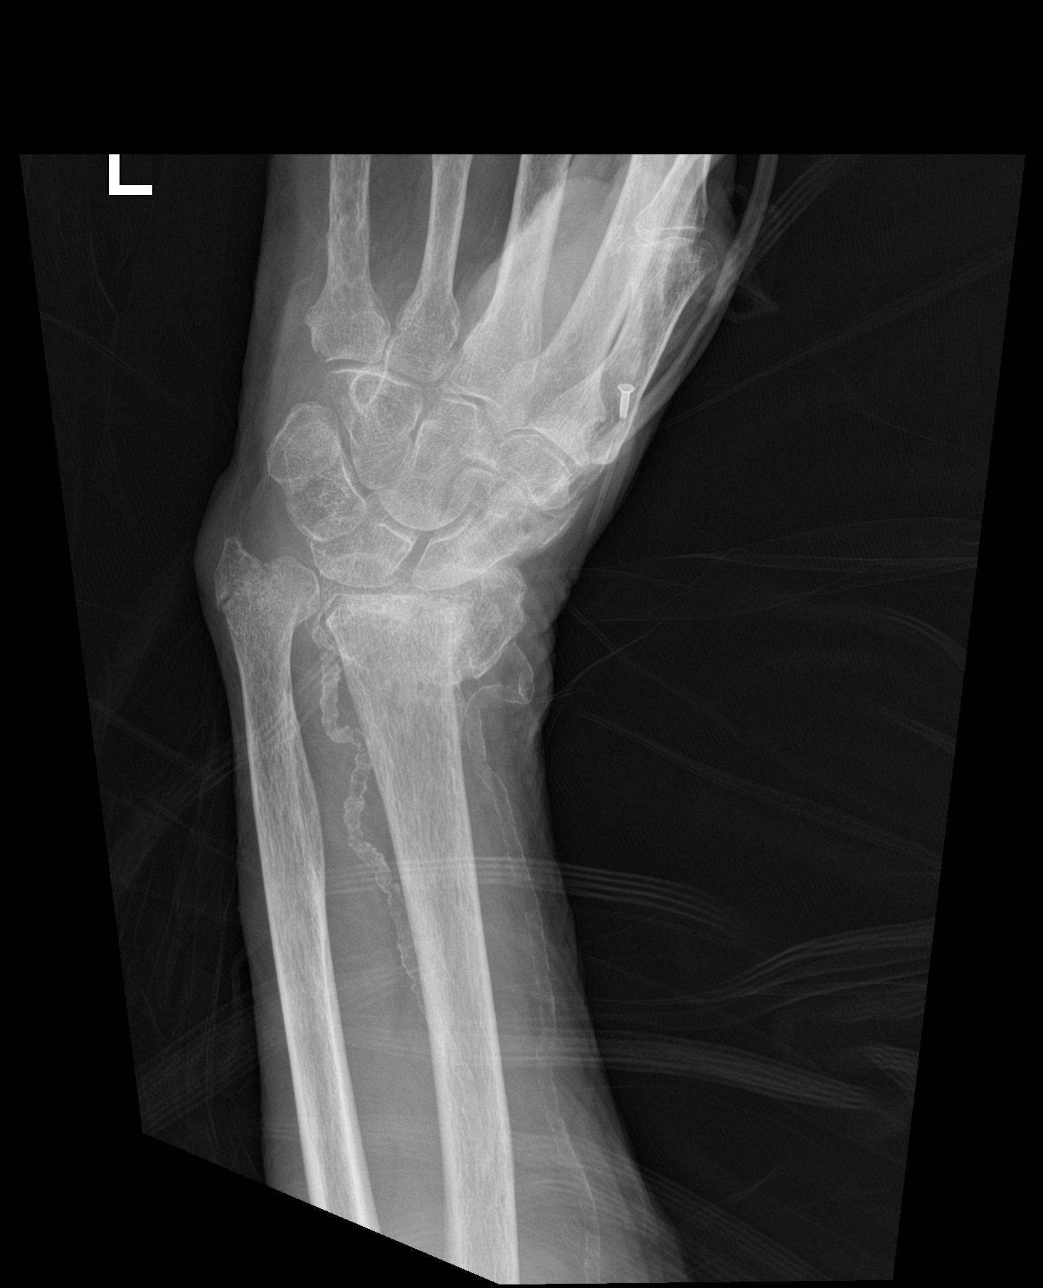
[im 2/3]
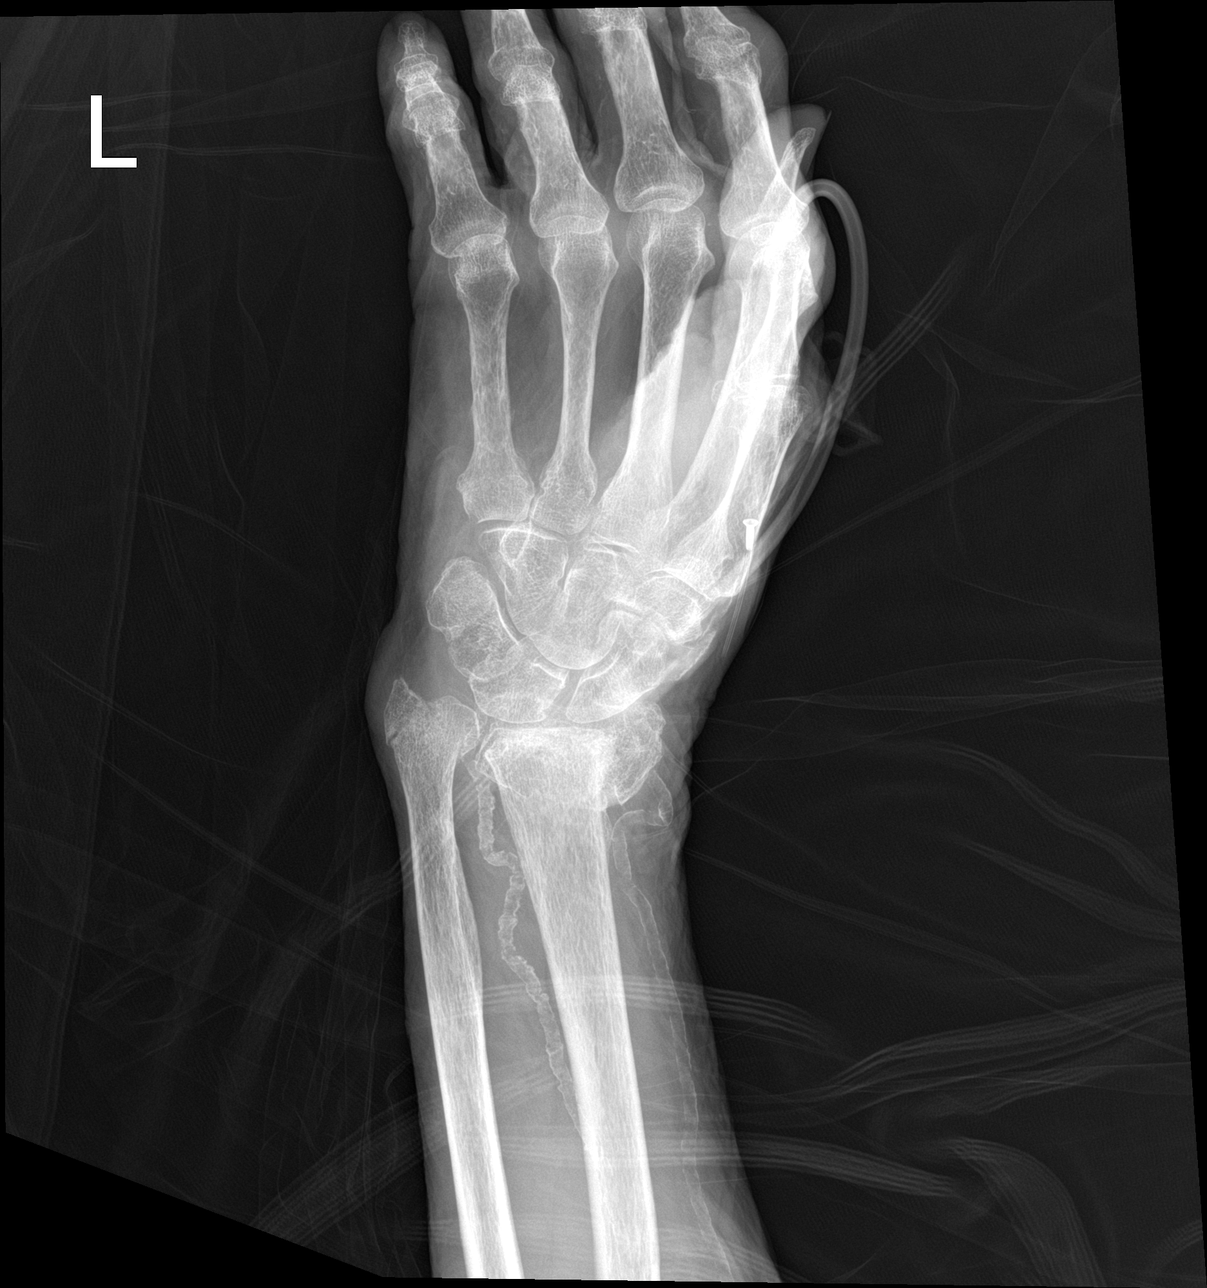
[im 3/3]
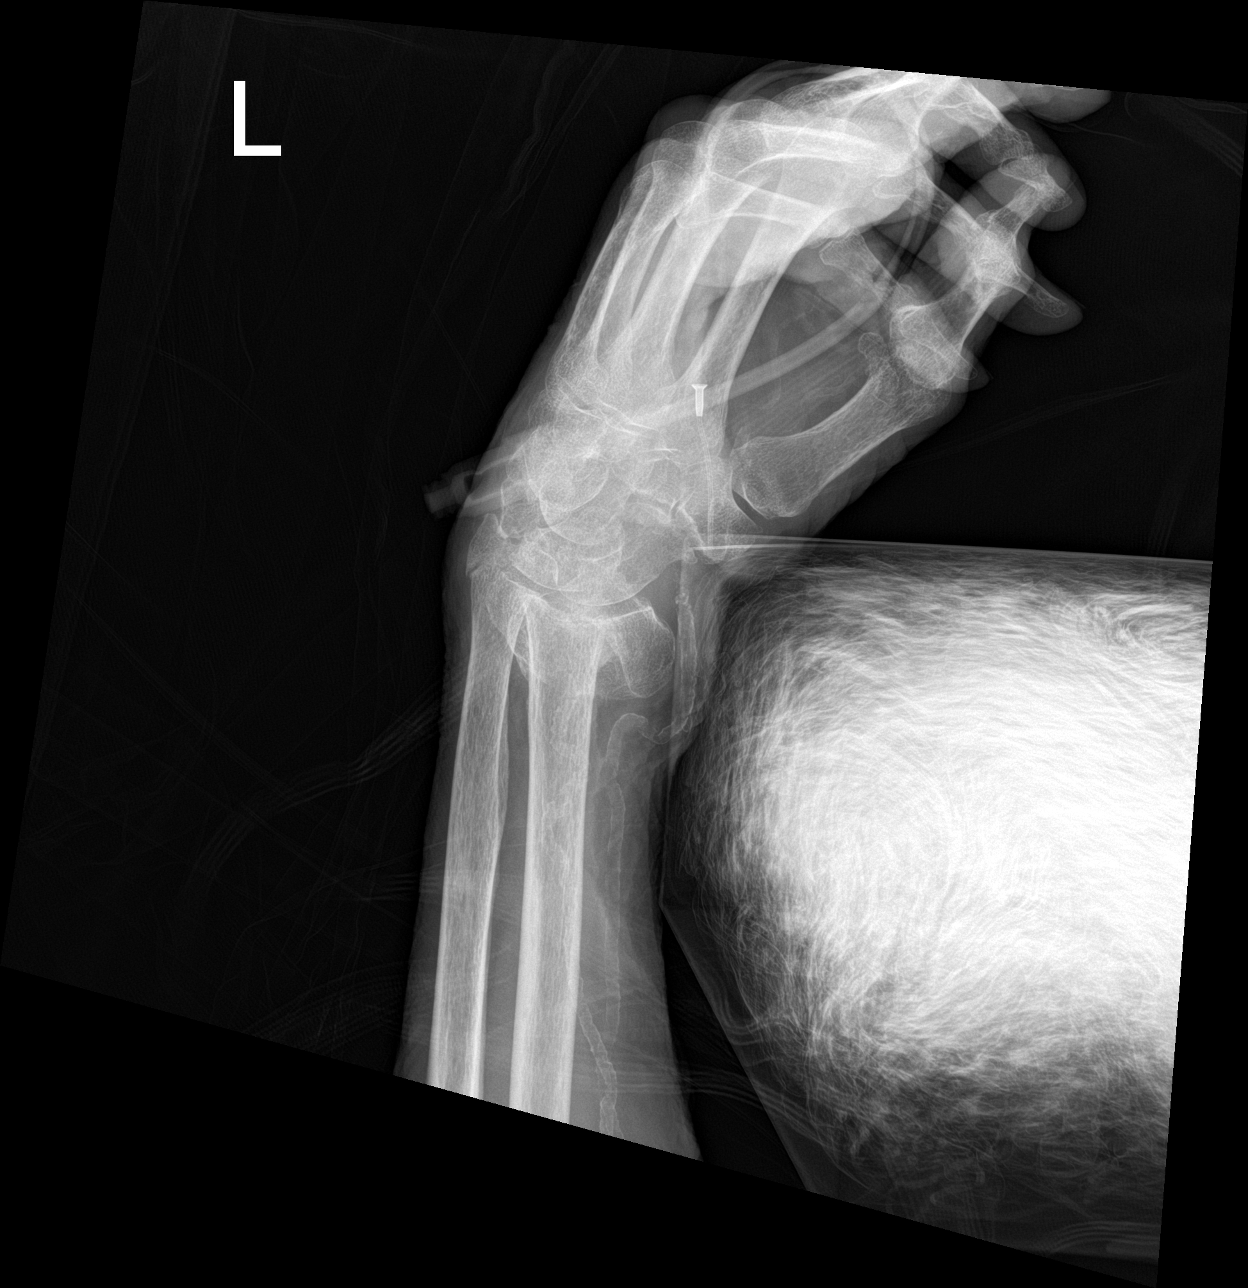

[3 of 3 positions shown; findings below may reference images not displayed]

FINDINGS: Revisualization of the sequela of an impacted fracture of the distal
LEFT radius and ulna. There is mature osseous bridging with
scattered residual areas of lucency from prior fracture sites.
Fracture fragments are in unchanged alignment. No definitive acute
fracture. Vascular calcifications. Osteopenia. Degenerative changes
throughout the DIPs and PIPs.
IMPRESSION: Revisualization of sequela of prior impacted fracture of the distal
radius and ulna. Evaluation for superimposed acute fracture is
limited due to osteopenia and underlying chronic osseous remodeling.
No definitive superimposed acute fracture is noted.

If persistent clinical concern for scaphoid fracture, recommend
immobilization and follow-up radiographs in 2 weeks versus MRI.

## 2020-09-11 IMAGING — DX DG HAND COMPLETE 3+V*L*
1 series · 3 of 3 positions shown · non-contrast
Comparison: [DATE] common [DATE]

CLINICAL DATA: fall

EXAM:
LEFT HAND - COMPLETE 3+ VIEW; LEFT WRIST - COMPLETE 3+ VIEW

[Series 1: hand · 0.14mm/px · 3 of 3 slices shown]
[im 1/3]
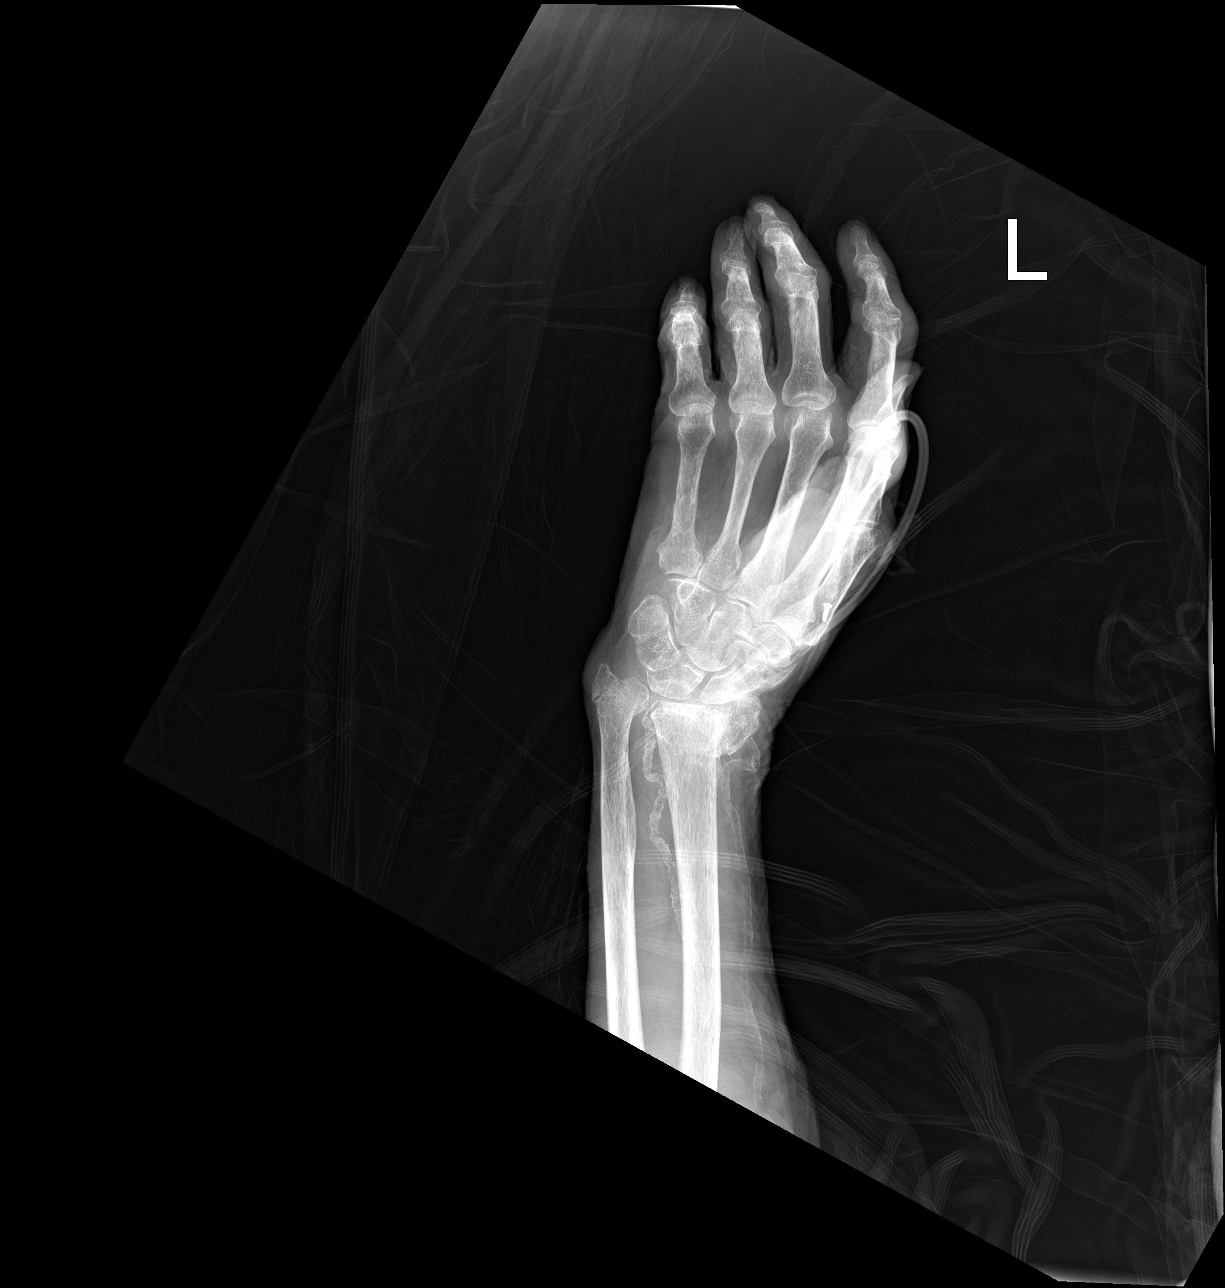
[im 2/3]
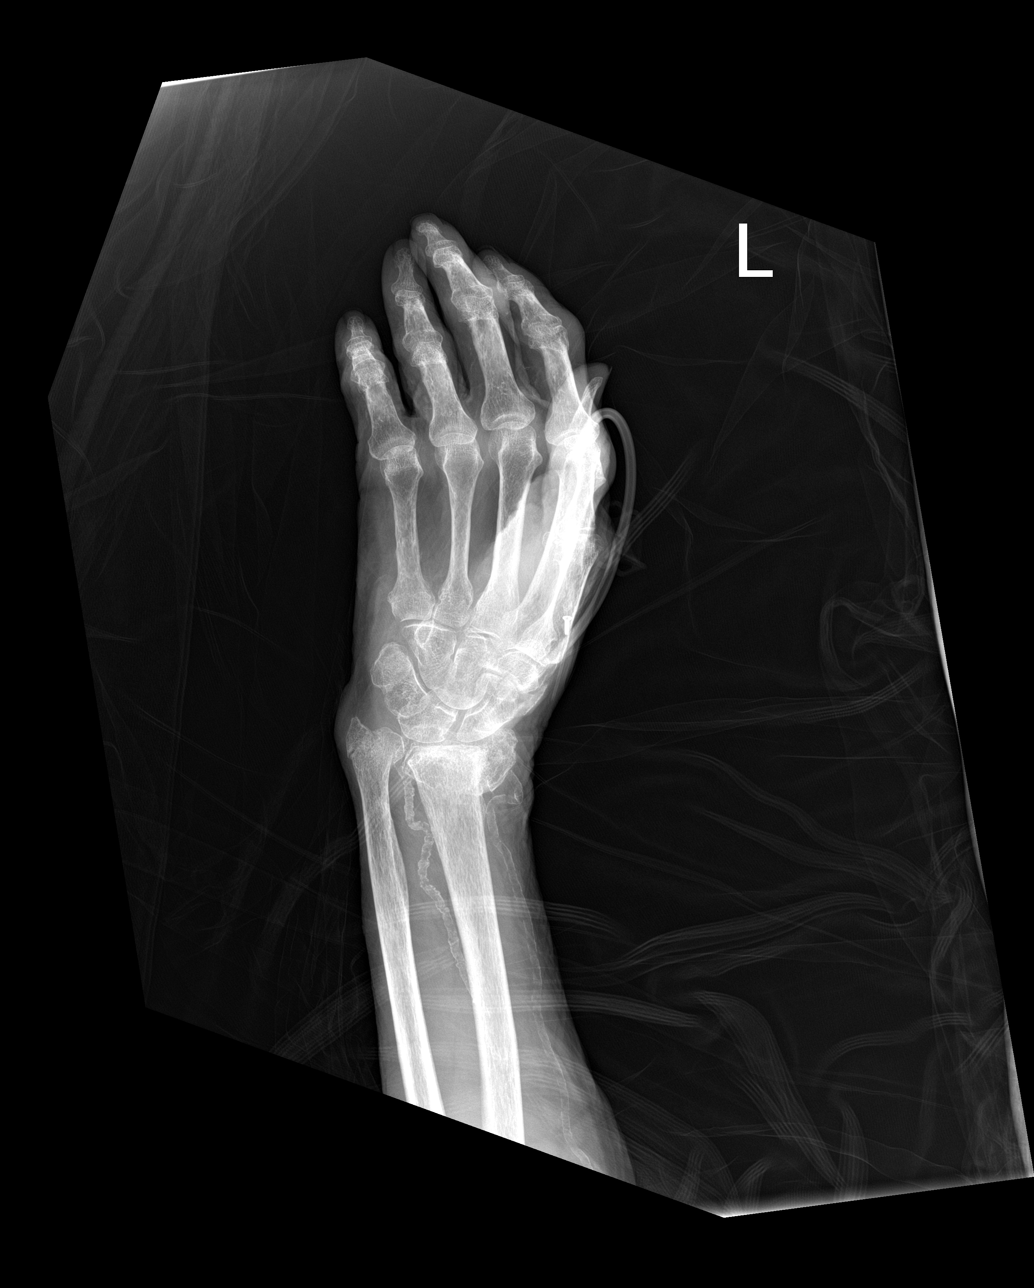
[im 3/3]
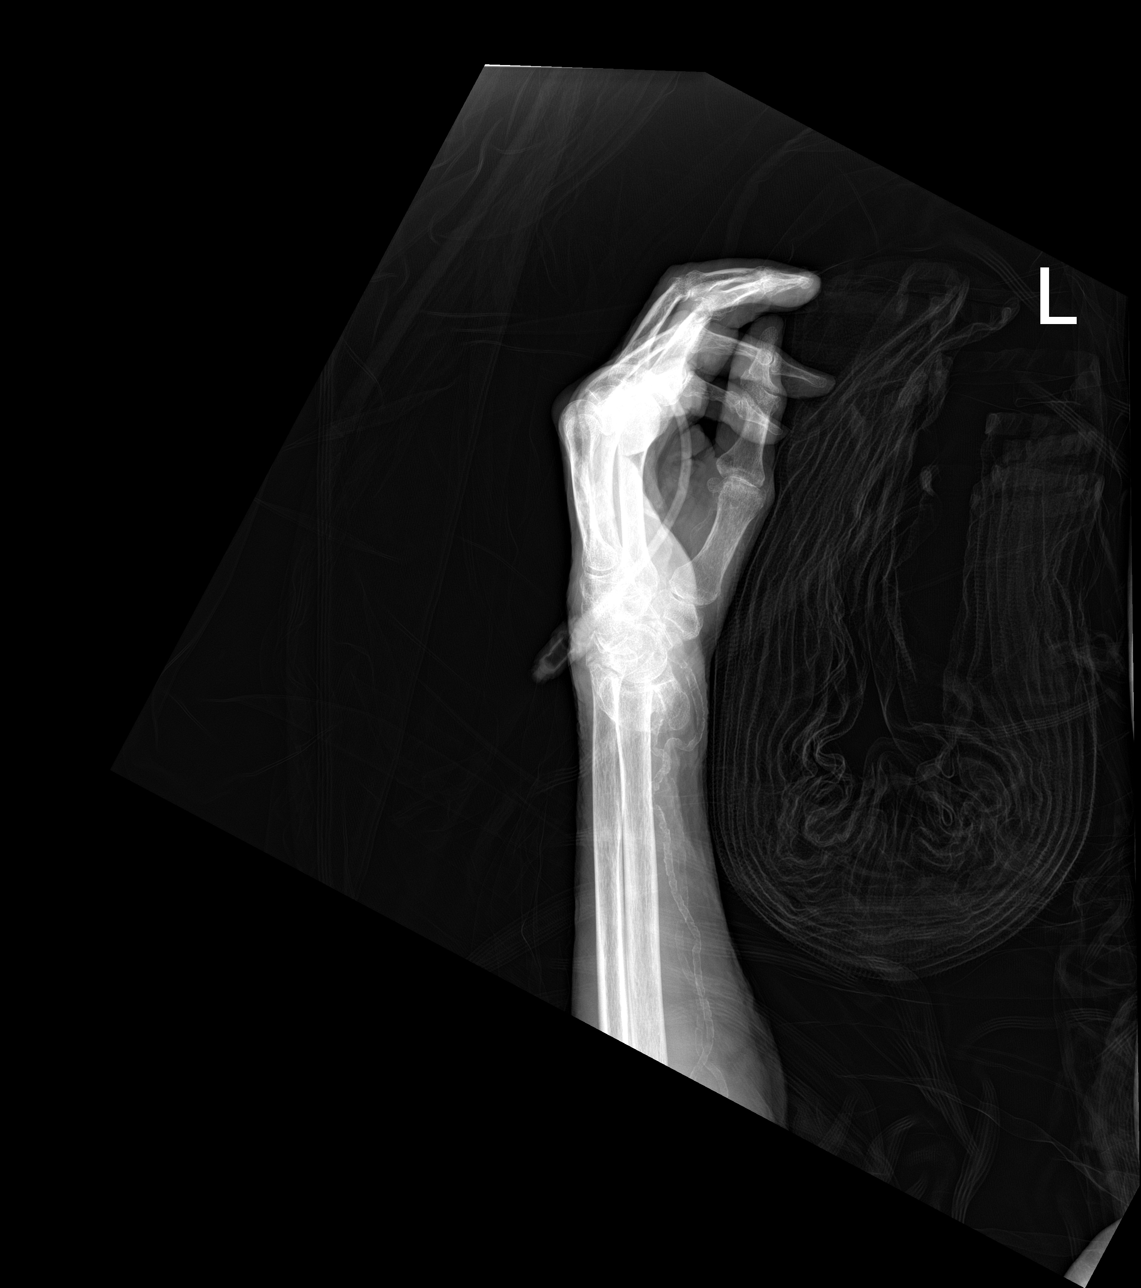

[3 of 3 positions shown; findings below may reference images not displayed]

FINDINGS: Revisualization of the sequela of an impacted fracture of the distal
LEFT radius and ulna. There is mature osseous bridging with
scattered residual areas of lucency from prior fracture sites.
Fracture fragments are in unchanged alignment. No definitive acute
fracture. Vascular calcifications. Osteopenia. Degenerative changes
throughout the DIPs and PIPs.
IMPRESSION: Revisualization of sequela of prior impacted fracture of the distal
radius and ulna. Evaluation for superimposed acute fracture is
limited due to osteopenia and underlying chronic osseous remodeling.
No definitive superimposed acute fracture is noted.

If persistent clinical concern for scaphoid fracture, recommend
immobilization and follow-up radiographs in 2 weeks versus MRI.

## 2020-09-11 IMAGING — CT CT MAXILLOFACIAL W/O CM
3 of 7 series · 15 of 47 positions shown, 18 images · non-contrast
Comparison: Head CT [DATE]

CLINICAL DATA: Facial trauma.

EXAM:
CT MAXILLOFACIAL WITHOUT CONTRAST
TECHNIQUE: Multidetector CT imaging of the maxillofacial structures was
performed. Multiplanar CT image reconstructions were also generated.

[Series 6: maxilllofacial 2.0 hr59 3 · axial · 0.41mm/px · z∈[+488,+628]mm · 9 of 88 slices shown, 12 images]
[im 9/88  brain]
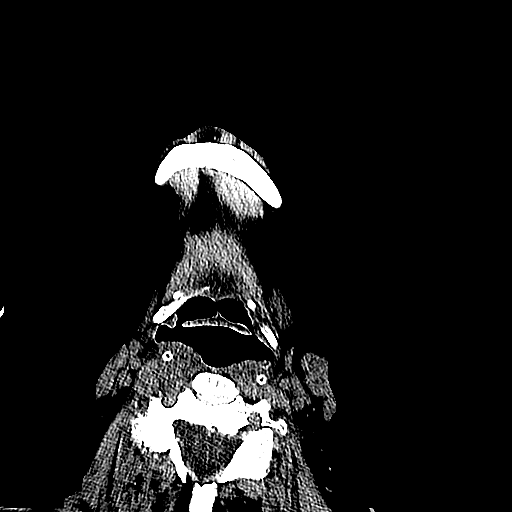
[im 9/88  bone]
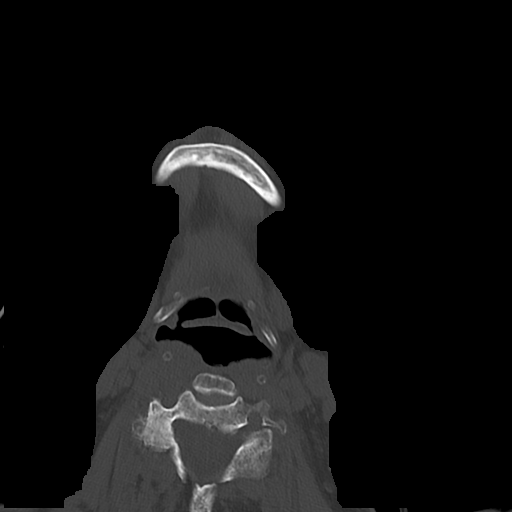
[im 18/88  bone]
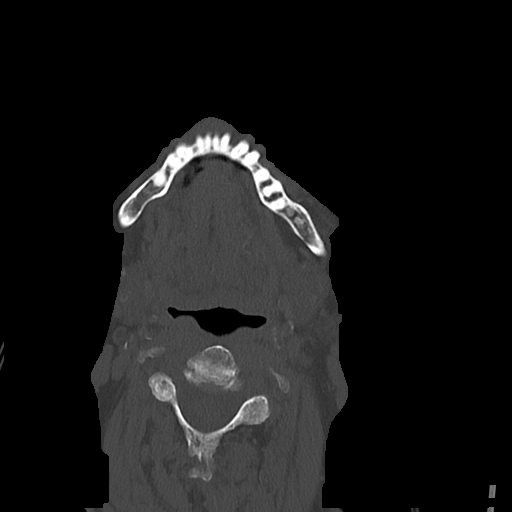
[im 27/88  bone]
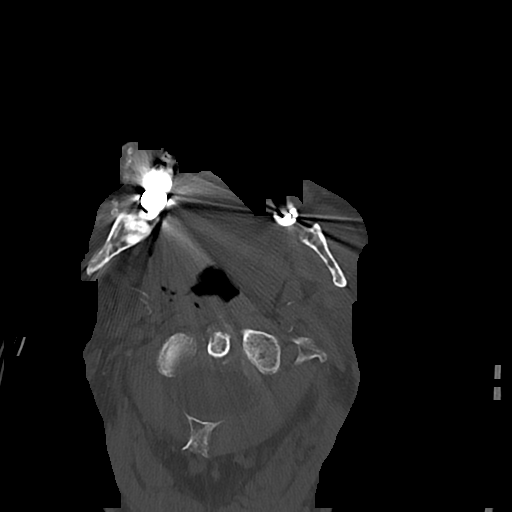
[im 35/88  bone]
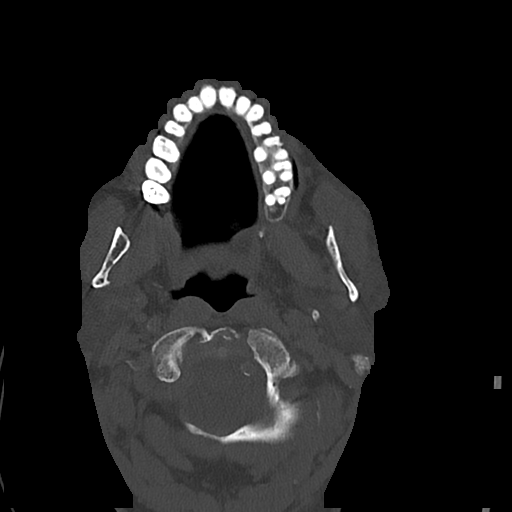
[im 44/88  brain]
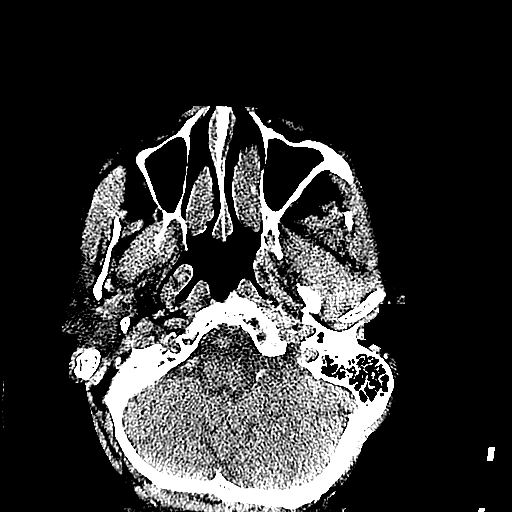
[im 44/88  bone]
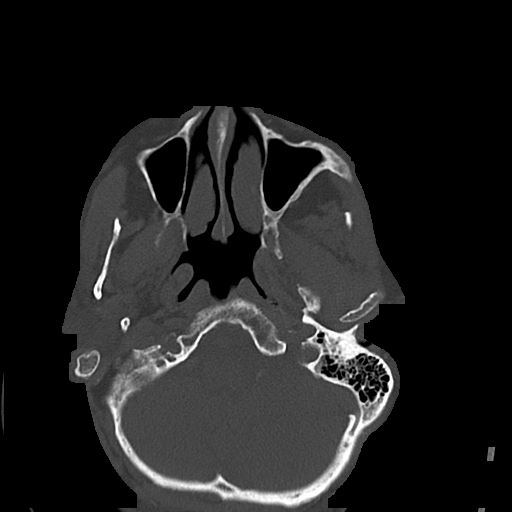
[im 53/88  bone]
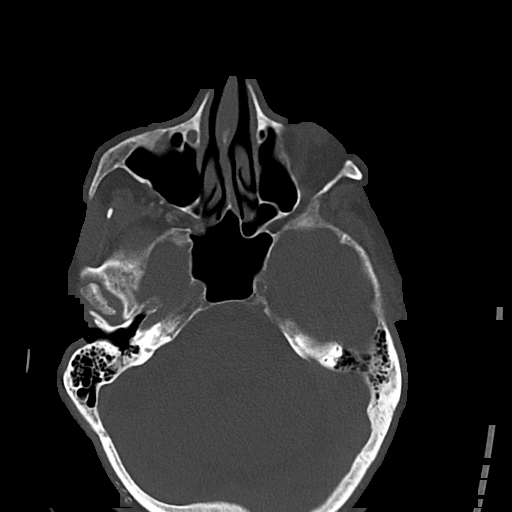
[im 61/88  bone]
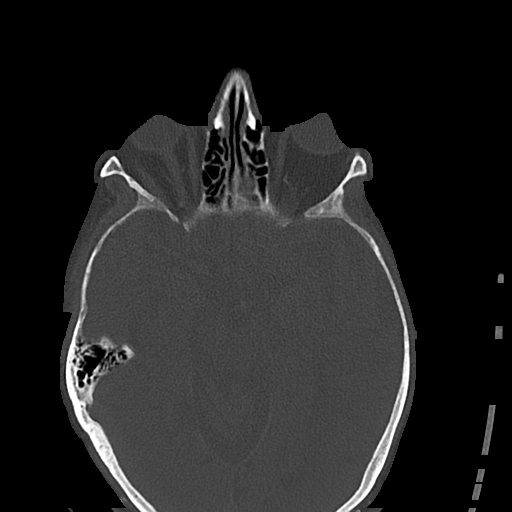
[im 70/88  bone]
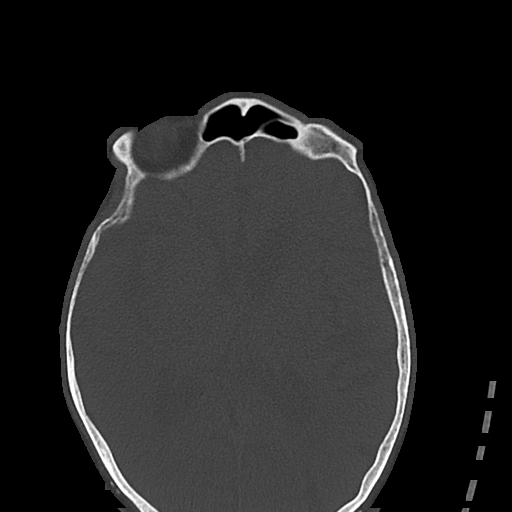
[im 79/88  brain]
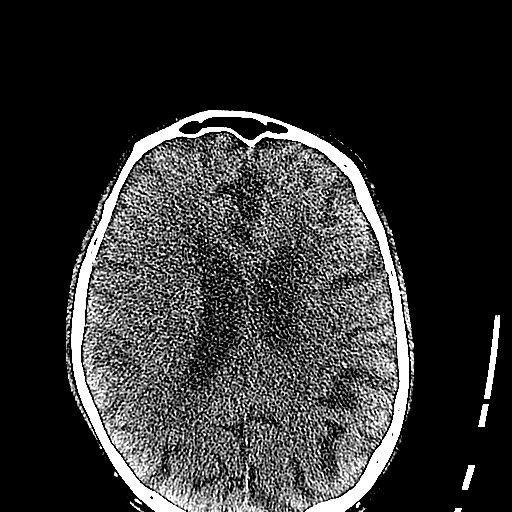
[im 79/88  bone]
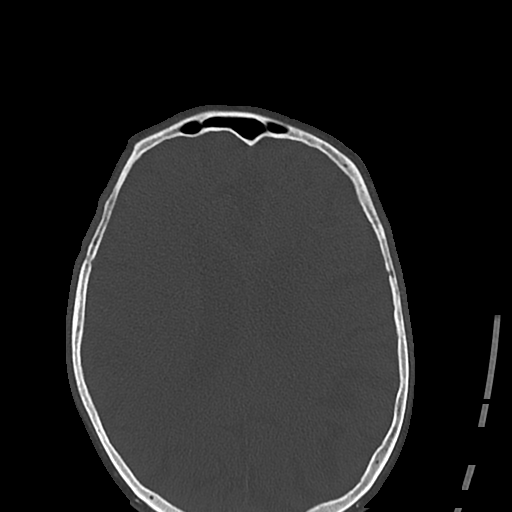

[Series 8: st cor · coronal · 0.36mm/px · 3 of 81 slices shown]
[im 21/81  bone]
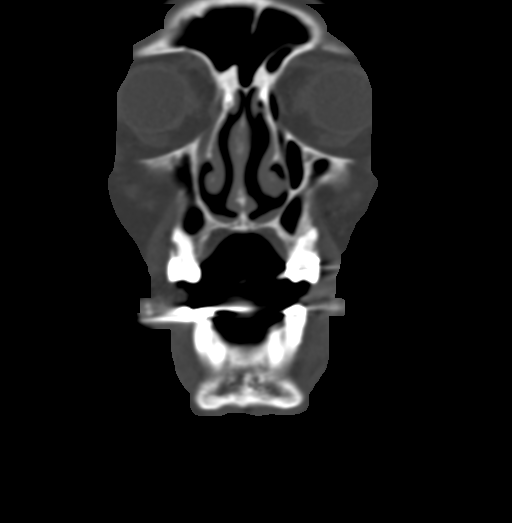
[im 41/81  bone]
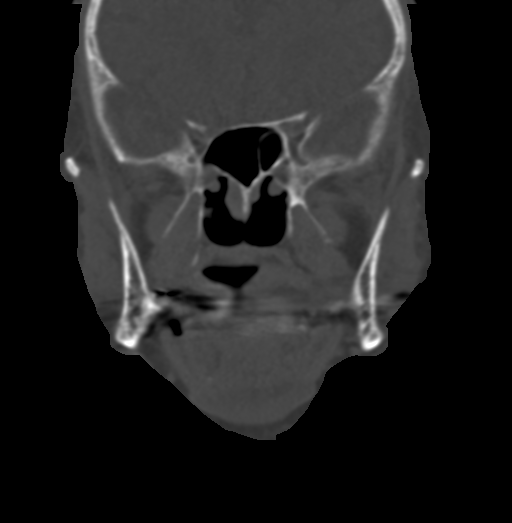
[im 61/81  bone]
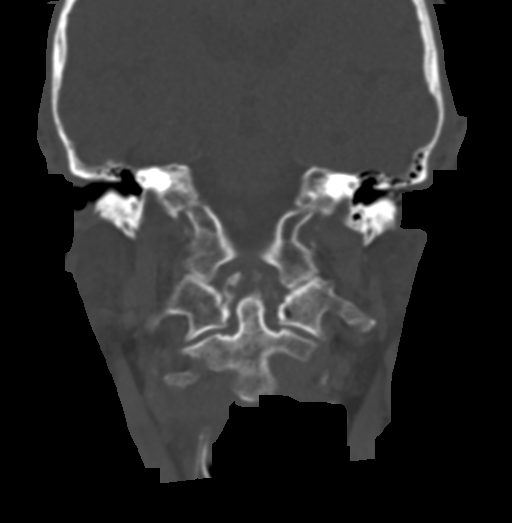

[Series 11: bone sag · sagittal · 0.37mm/px · 3 of 80 slices shown]
[im 31/80  bone]
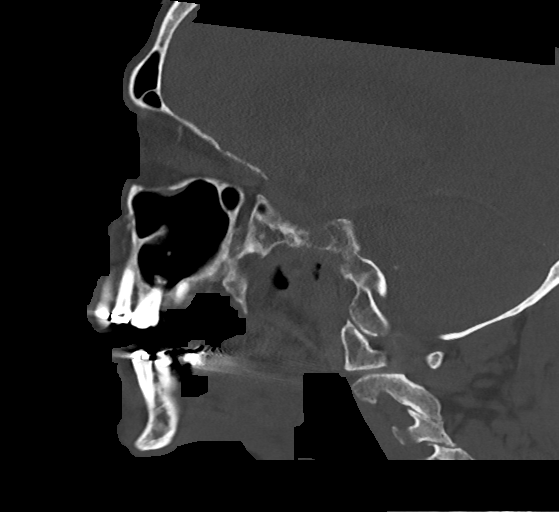
[im 47/80  bone]
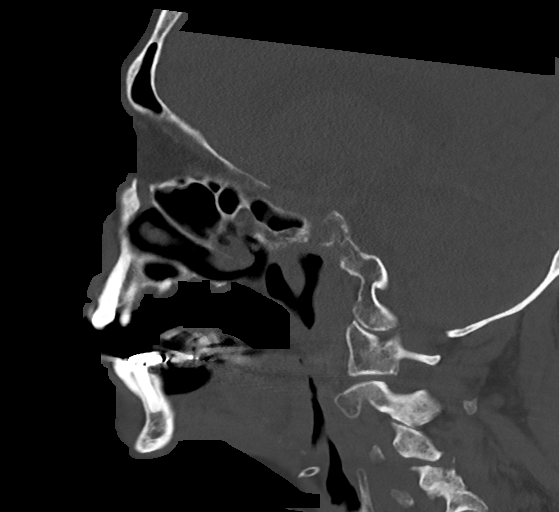
[im 63/80  bone]
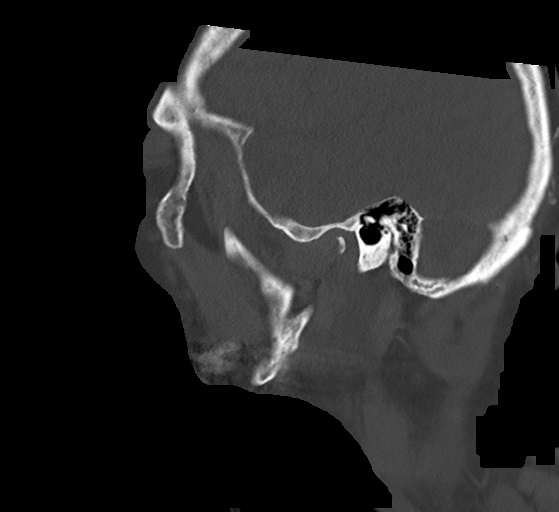

[15 of 47 positions shown; findings below may reference images not displayed]

FINDINGS: Osseous: No acute fracture, mandibular dislocation, or destructive
osseous process.

Orbits: Bilateral cataract extraction and proptosis. No orbital
hematoma or mass.

Sinuses: Paranasal sinuses and mastoid air cells are clear. Mild
rightward nasal septal deviation.

Soft tissues: Punctate left parotid calcification.  Atherosclerosis.

Limited intracranial: More fully evaluated on separate head CT.
IMPRESSION: No acute maxillofacial fracture.

## 2020-09-11 IMAGING — CT CT HEAD CODE STROKE
4 series · 15 of 47 positions shown, 17 images · non-contrast
Comparison: [DATE]

CLINICAL DATA: Code stroke. Neuro deficit, acute, stroke suspected.

EXAM:
CT HEAD WITHOUT CONTRAST
TECHNIQUE: Contiguous axial images were obtained from the base of the skull
through the vertex without intravenous contrast.

[Series 2: head wo · axial · 0.47mm/px · z∈[+548,+674]mm · 7 of 35 slices shown, 9 images]
[im 5/35  brain]
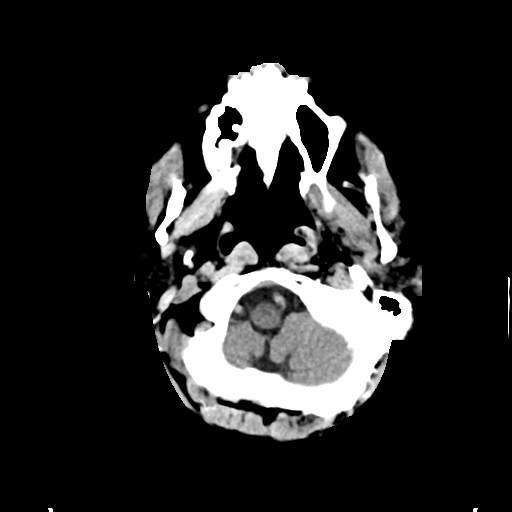
[im 5/35  bone]
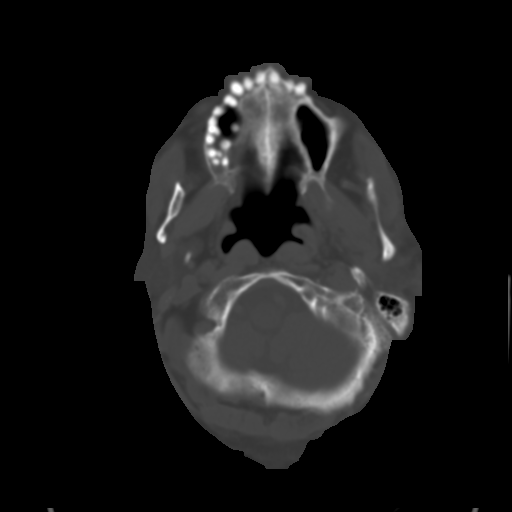
[im 9/35  brain]
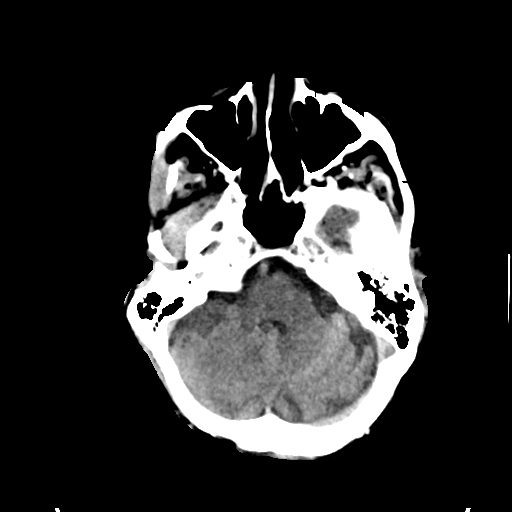
[im 13/35  brain]
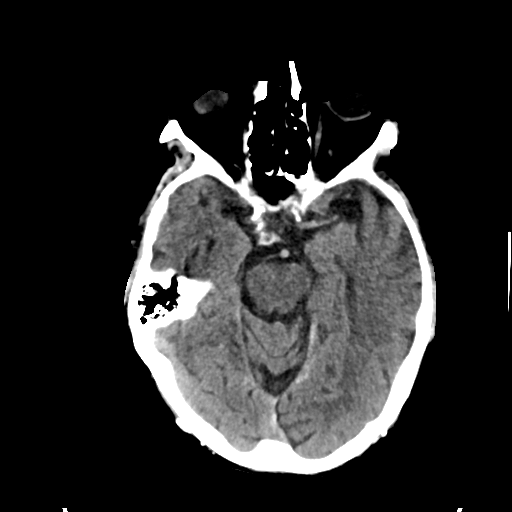
[im 18/35  brain]
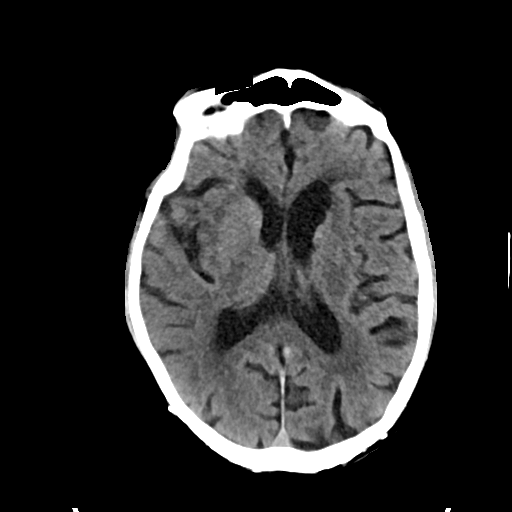
[im 22/35  brain]
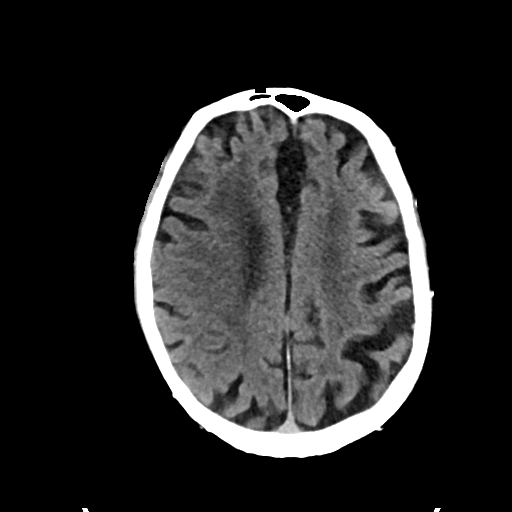
[im 22/35  bone]
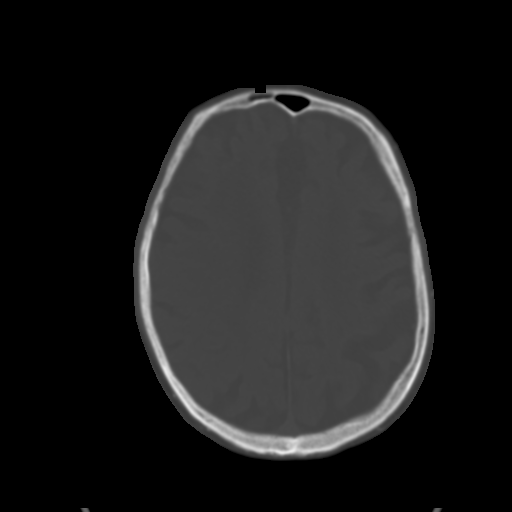
[im 26/35  brain]
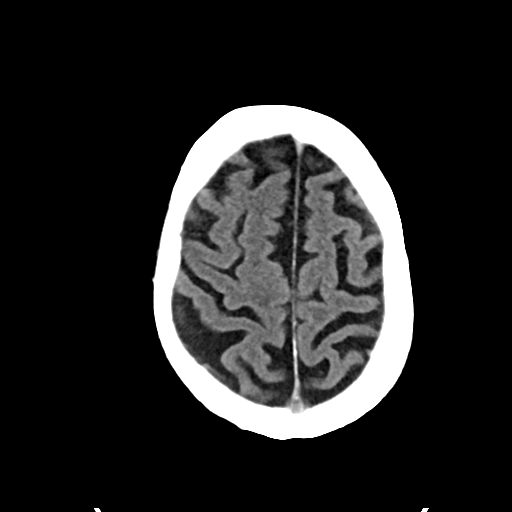
[im 30/35  brain]
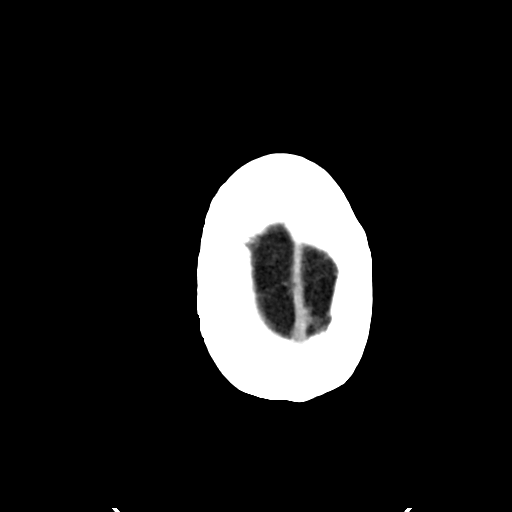

[Series 3: head bone · axial · 0.34mm/px · z∈[+573,+589]mm · 2 of 85 slices shown]
[im 9/85  bone]
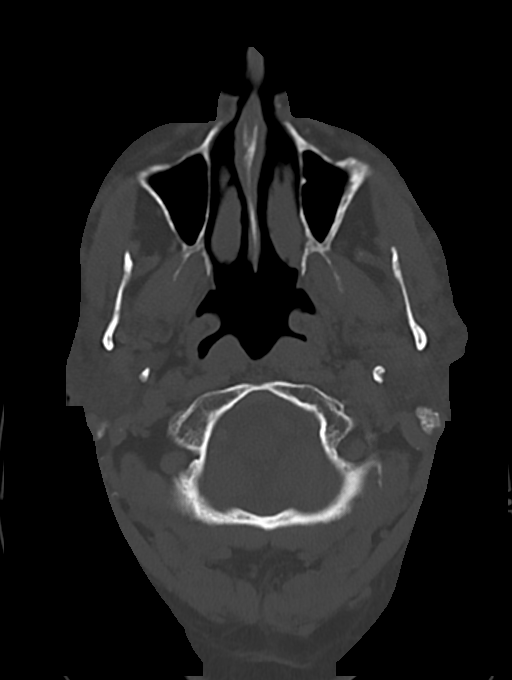
[im 17/85  bone]
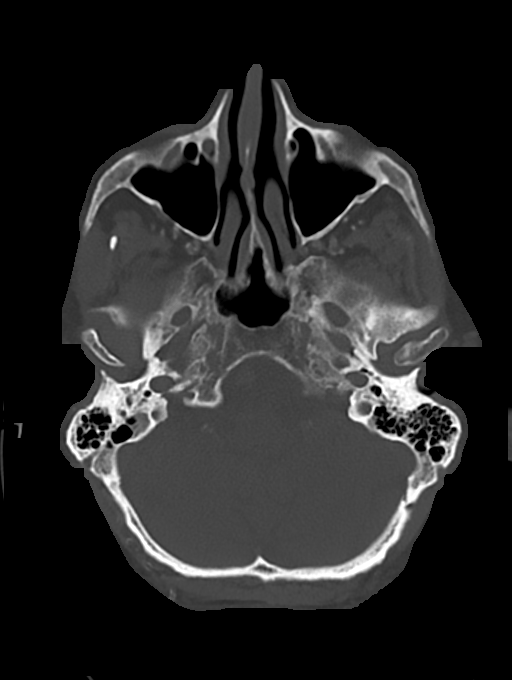

[Series 4: sag soft · sagittal · 0.33mm/px · 3 of 58 slices shown]
[im 20/58  brain]
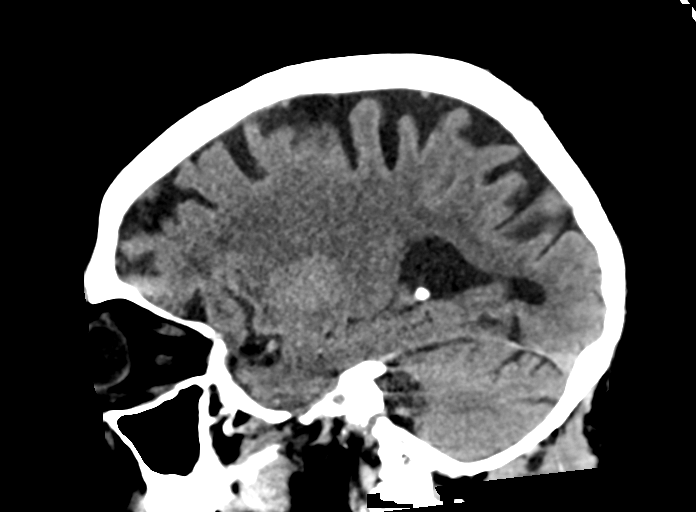
[im 29/58  brain]
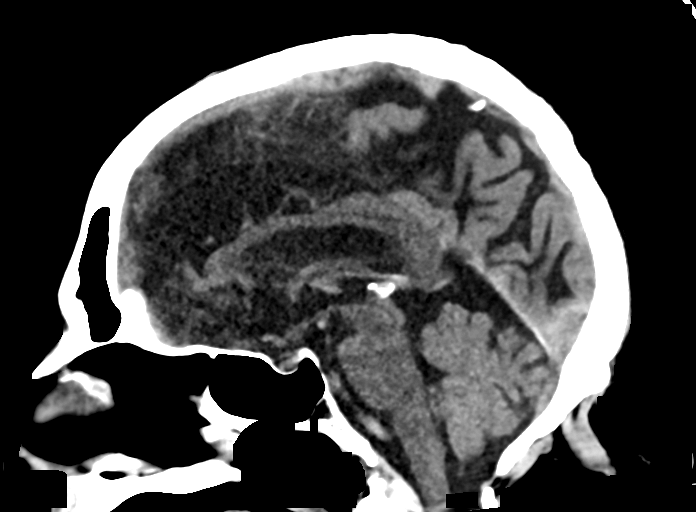
[im 39/58  brain]
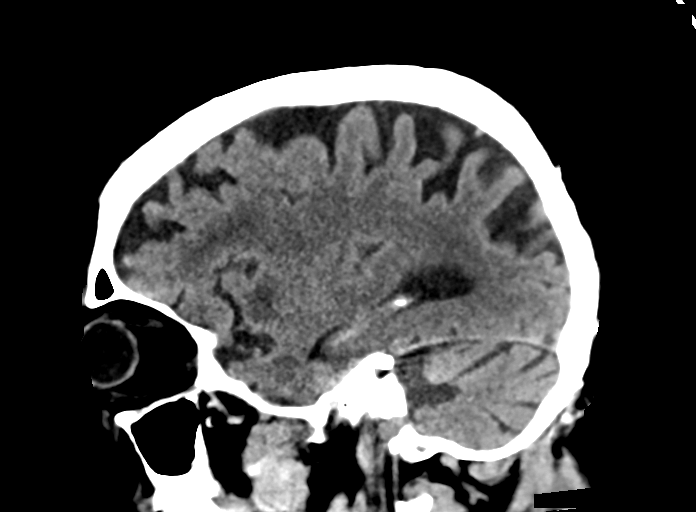

[Series 5: cor soft · coronal · 0.34mm/px · 3 of 67 slices shown]
[im 23/67  brain]
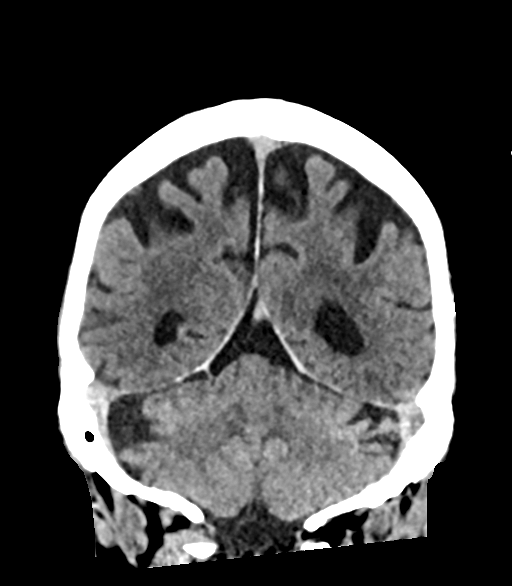
[im 30/67  brain]
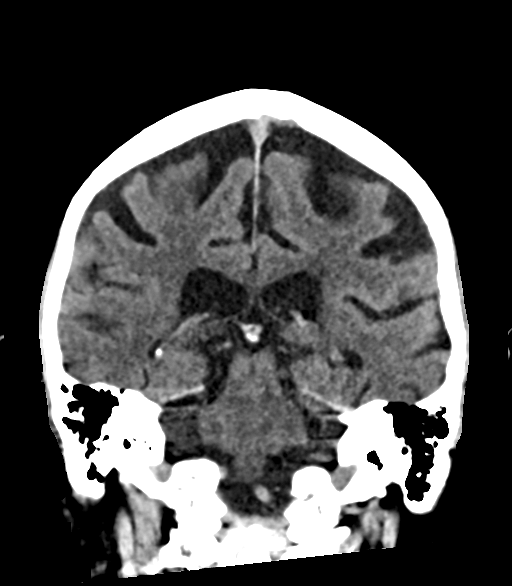
[im 37/67  brain]
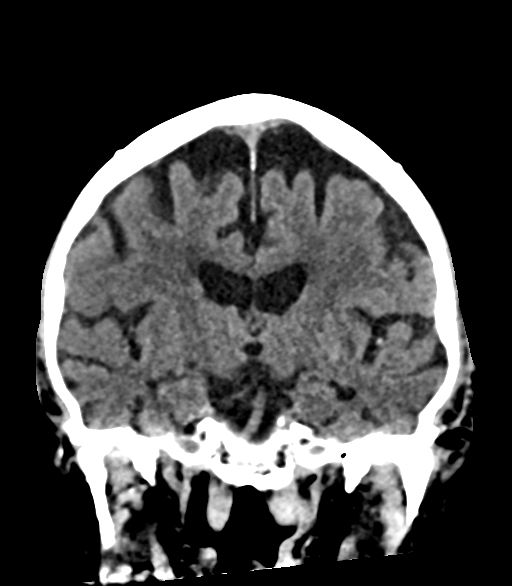

[15 of 47 positions shown; findings below may reference images not displayed]

FINDINGS: Brain: There is no evidence of an acute infarct, intracranial
hemorrhage, mass, midline shift, or extra-axial fluid collection.
There is mild-to-moderate cerebral atrophy. Patchy hypodensities in
the cerebral white matter bilaterally are similar to the prior
motion degraded CT and are nonspecific but compatible with moderate
chronic small vessel ischemic disease. A chronic infarct is again
noted in the left basal ganglia. There is also a small chronic right
cerebellar infarct.

Vascular: Calcified atherosclerosis at the skull base. No hyperdense
vessel.

Skull: No fracture or suspicious osseous lesion.

Sinuses/Orbits: Paranasal sinuses and mastoid air cells are clear.
Bilateral cataract extraction.

Other: None.

ASPECTS (Alberta Stroke Program Early CT Score)

- Ganglionic level infarction (caudate, lentiform nuclei, internal
capsule, insula, M1-M3 cortex): 7

- Supraganglionic infarction (M4-M6 cortex): 3

Total score (0-10 with 10 being normal): 10
IMPRESSION: 1. No evidence of acute intracranial abnormality.
2. ASPECTS is 10.
3. Moderate chronic small vessel ischemic disease.

These results were communicated to [REDACTED] at [DATE] on [DATE]
by text page via the AMION messaging system.

## 2020-09-11 IMAGING — CT CT CERVICAL SPINE W/O CM
3 series · 13 of 27 positions shown, 16 images · non-contrast
Comparison: None.

CLINICAL DATA: Facial trauma.

EXAM:
CT CERVICAL SPINE WITHOUT CONTRAST
TECHNIQUE: Multidetector CT imaging of the cervical spine was performed without
intravenous contrast. Multiplanar CT image reconstructions were also
generated.

[Series 2: c spine bone · axial · 0.31mm/px · z∈[+446,+542]mm · 5 of 74 slices shown, 7 images]
[im 13/74  soft-tissue]
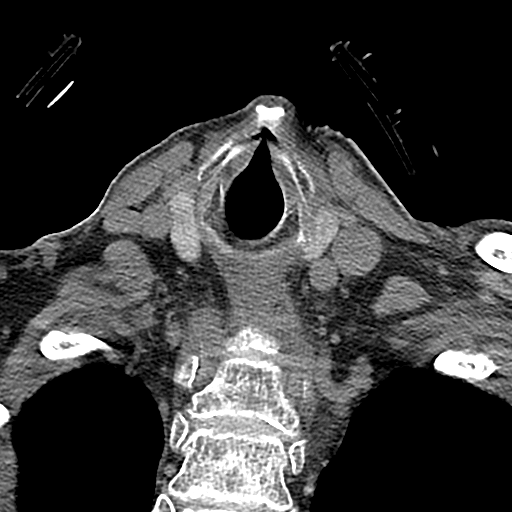
[im 13/74  bone]
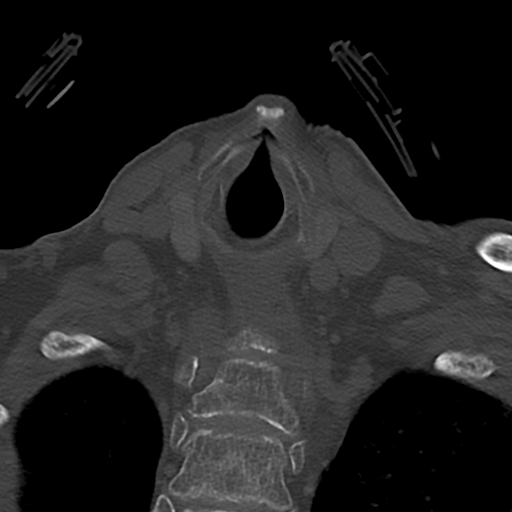
[im 25/74  bone]
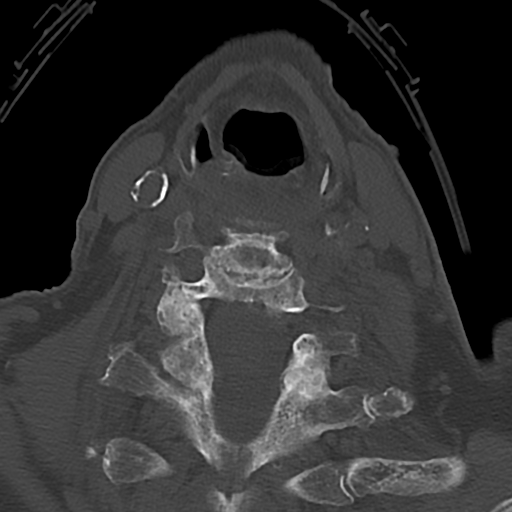
[im 37/74  bone]
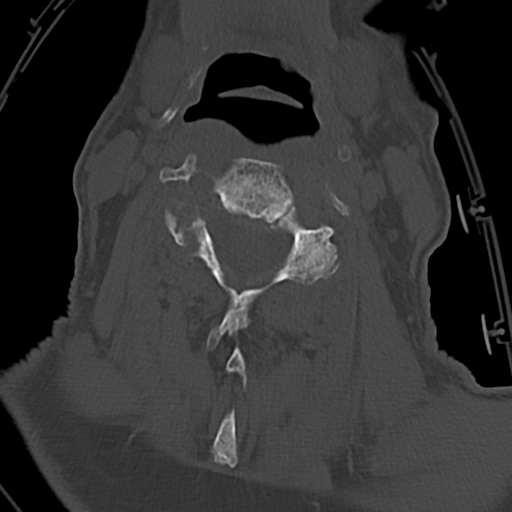
[im 49/74  bone]
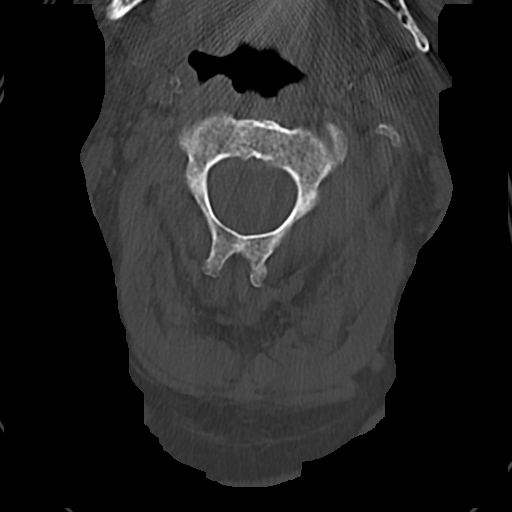
[im 61/74  soft-tissue]
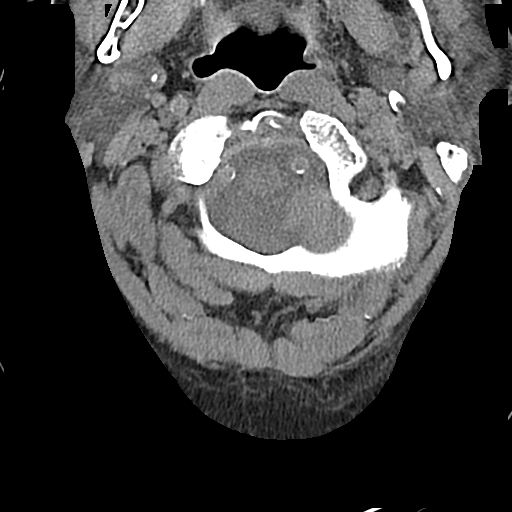
[im 61/74  bone]
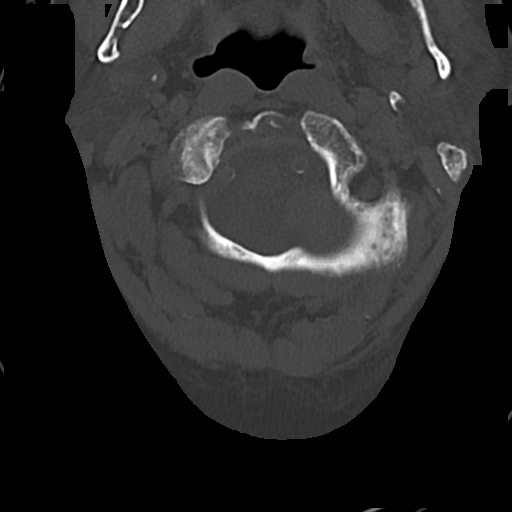

[Series 4: c spine soft · axial · 0.36mm/px · z∈[+426,+473]mm · 3 of 80 slices shown]
[im 14/80  soft-tissue]
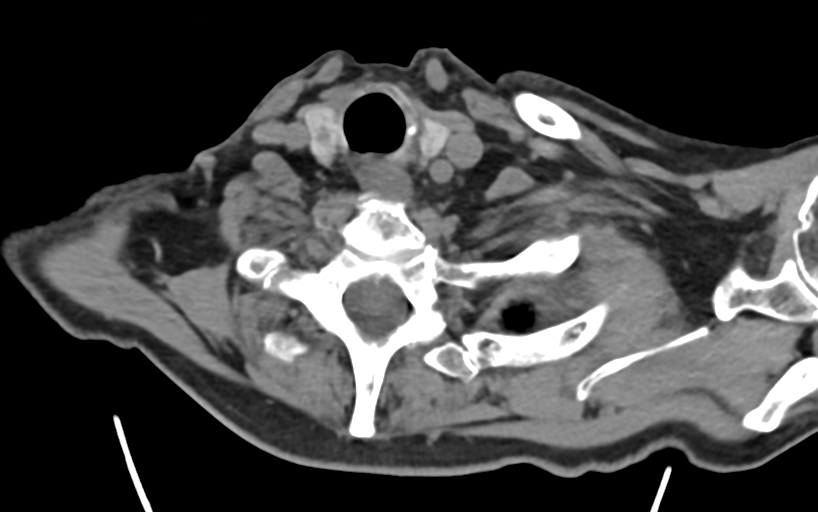
[im 27/80  soft-tissue]
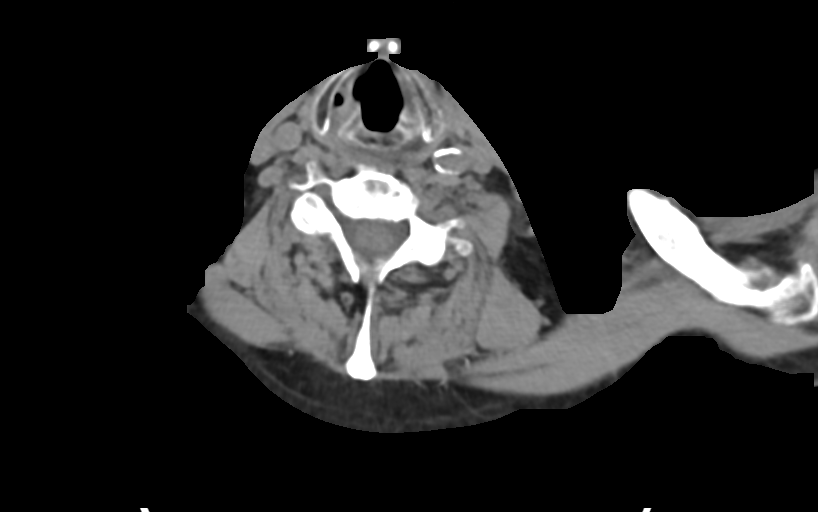
[im 40/80  soft-tissue]
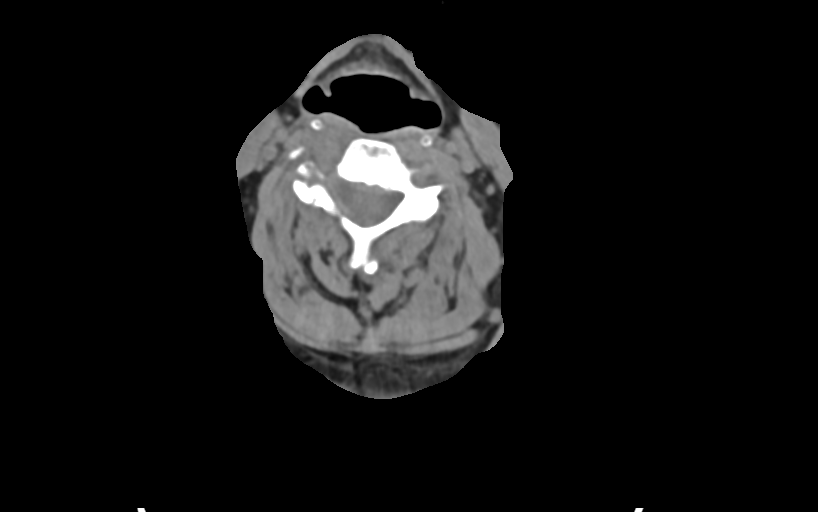

[Series 7: sag bone · sagittal · 0.32mm/px · 5 of 67 slices shown, 6 images]
[im 23/67  bone]
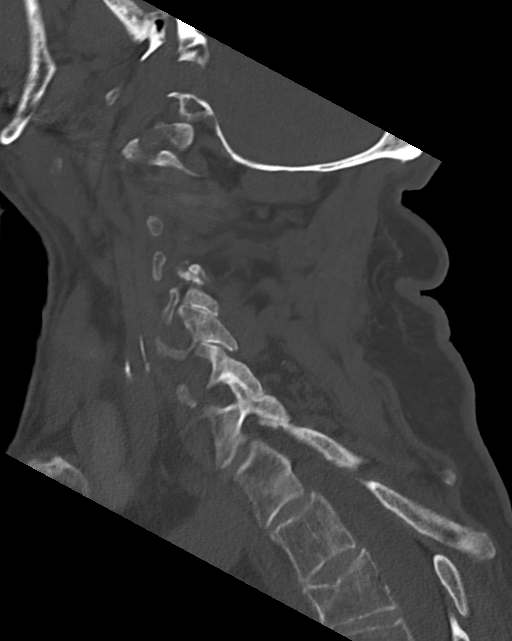
[im 28/67  bone]
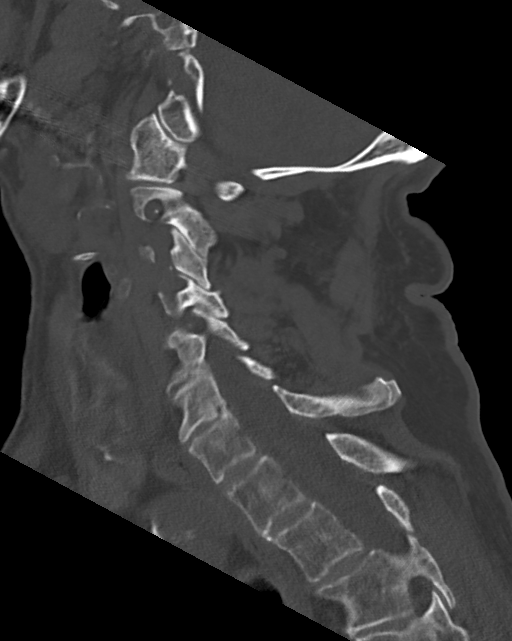
[im 34/67  soft-tissue]
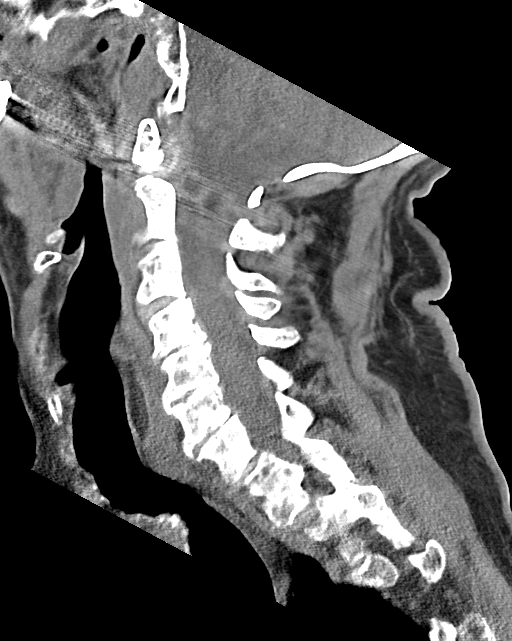
[im 34/67  bone]
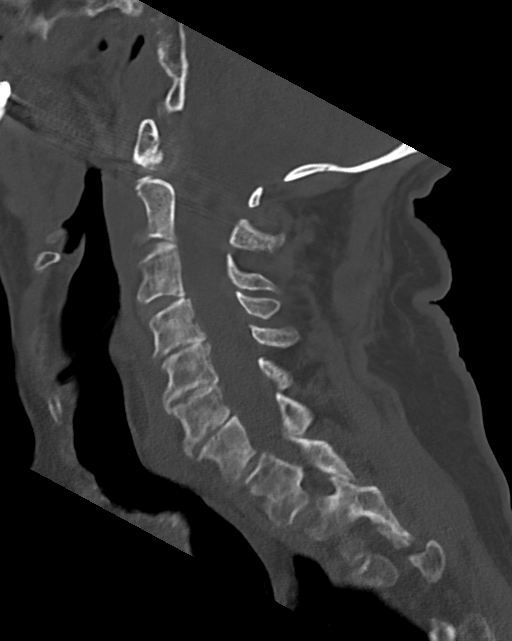
[im 39/67  bone]
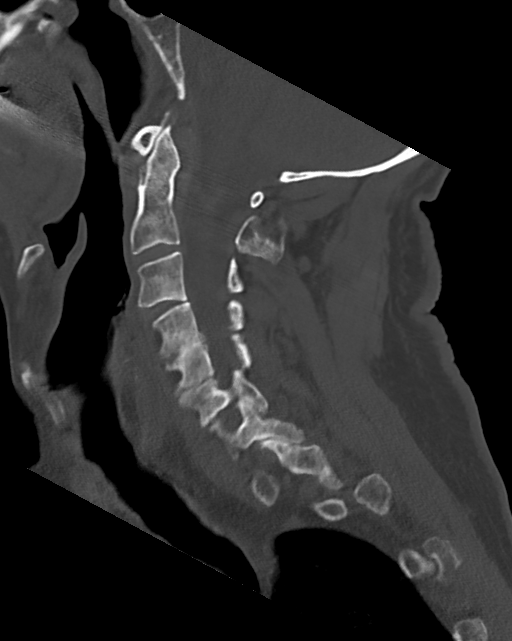
[im 45/67  bone]
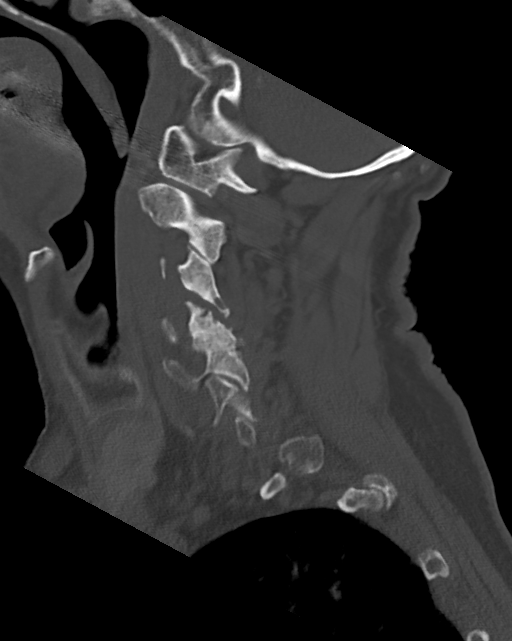

[13 of 27 positions shown; findings below may reference images not displayed]

FINDINGS: Alignment: No evidence of acute traumatic subluxation.

Skull base and vertebrae: No acute cervical spine fracture or
suspicious osseous lesion. Mild superior endplate compression
fracture at T3, likely chronic.

Soft tissues and spinal canal: No prevertebral fluid or swelling. No
visible canal hematoma.

Disc levels: Moderate to severe disc space narrowing from C4-5 to
C6-7. Severe bilateral facet arthrosis at C3-4 with small
subchondral cysts or erosions and joint widening on the right. Right
facet ankylosis at C4-5. No evidence of high-grade spinal stenosis
or high-grade neural foraminal stenosis.

Upper chest: No apical lung consolidation or mass.

Other: Subcentimeter thyroid nodules for which no imaging follow-up
is recommended. Carotid atherosclerosis.
IMPRESSION: 1. No acute cervical spine fracture.
2. Advanced cervical disc and facet degeneration.

## 2020-09-11 IMAGING — DX DG PORTABLE PELVIS
1 series · 1 of 1 positions shown · non-contrast
Comparison: [DATE]

CLINICAL DATA: trauma

EXAM:
PORTABLE PELVIS 1-2 VIEWS

[pelvis]
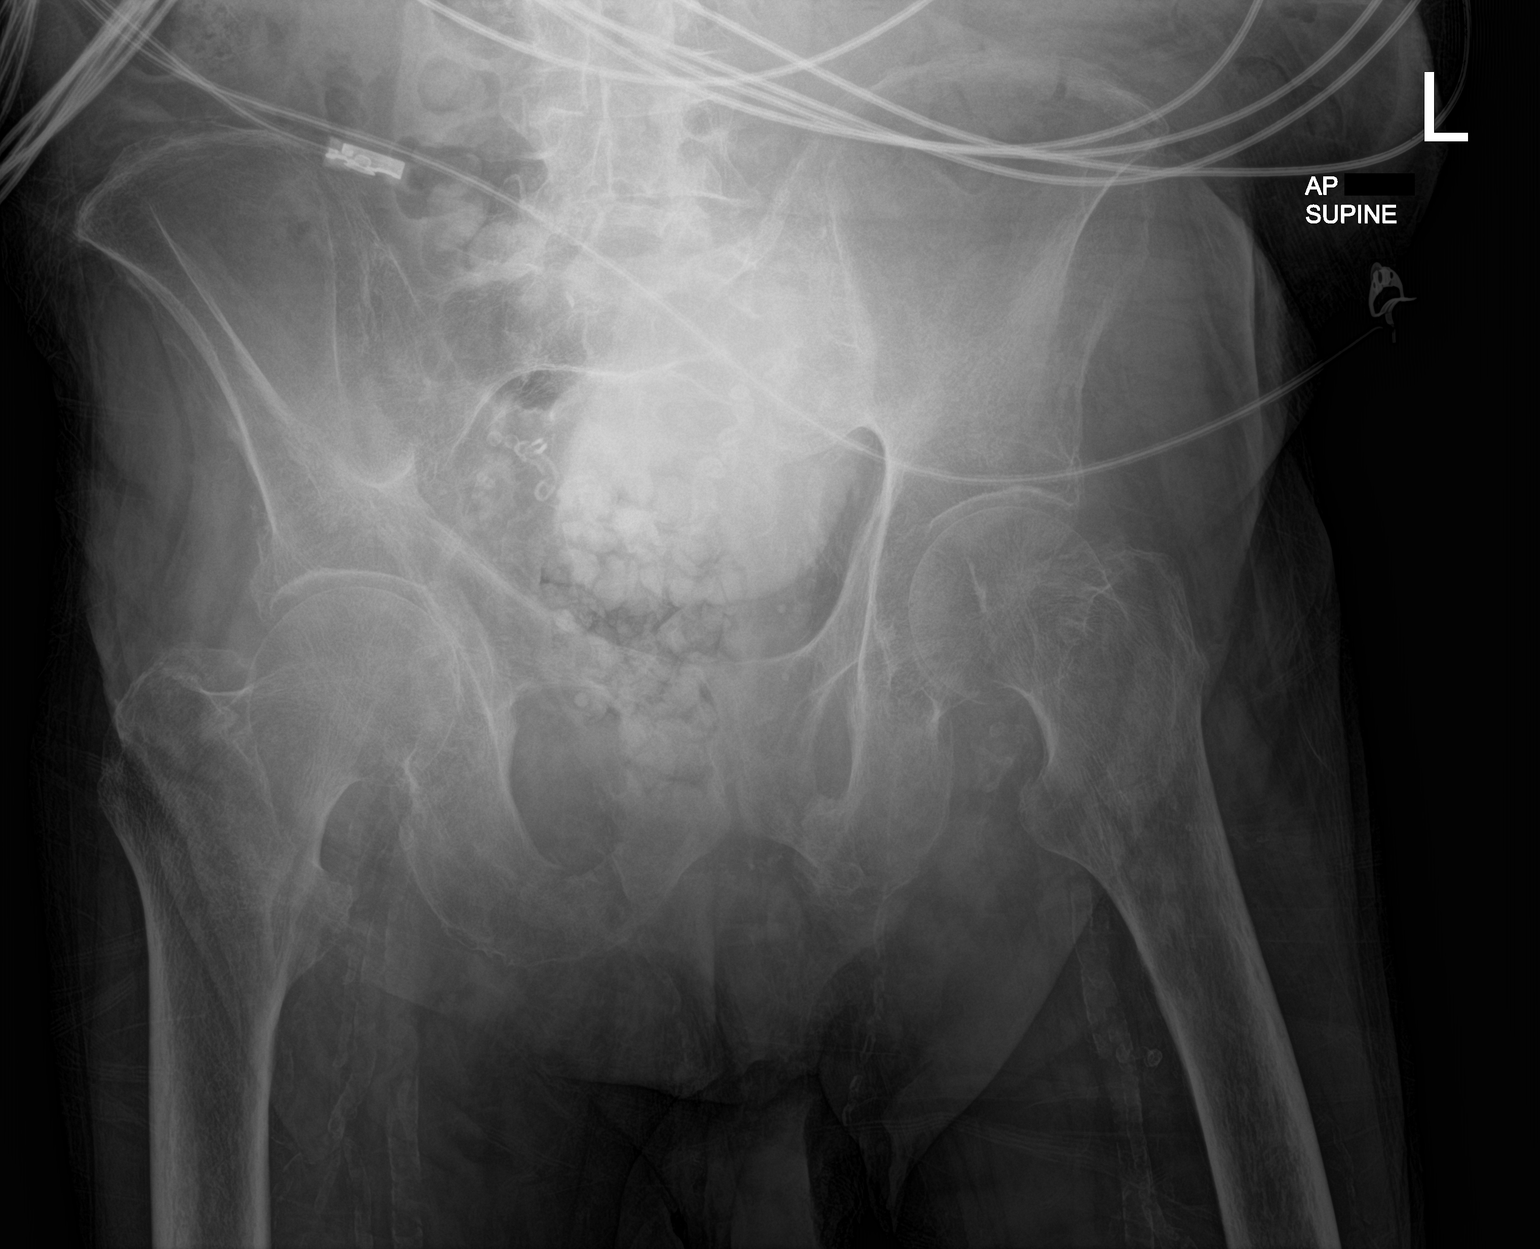

[1 of 1 positions shown; findings below may reference images not displayed]

FINDINGS: Osteopenia. Excreted contrast limits evaluation of the sacrum. No
acute displaced fracture seen on single view. No pelvic diastasis.
Vascular calcifications. Degenerative changes of the lumbar spine.
IMPRESSION: No acute fracture on single view.

If persistent concern for nondisplaced hip or pelvic fracture,
recommend dedicated pelvic MRI.

## 2020-09-11 IMAGING — DX DG CHEST 1V PORT
2 series · 2 of 2 positions shown · non-contrast
Comparison: Earlier radiograph dated [DATE].

CLINICAL DATA: 86-year-old male status post intubation.

EXAM:
PORTABLE CHEST 1 VIEW

[chest ap (1 of 2)]
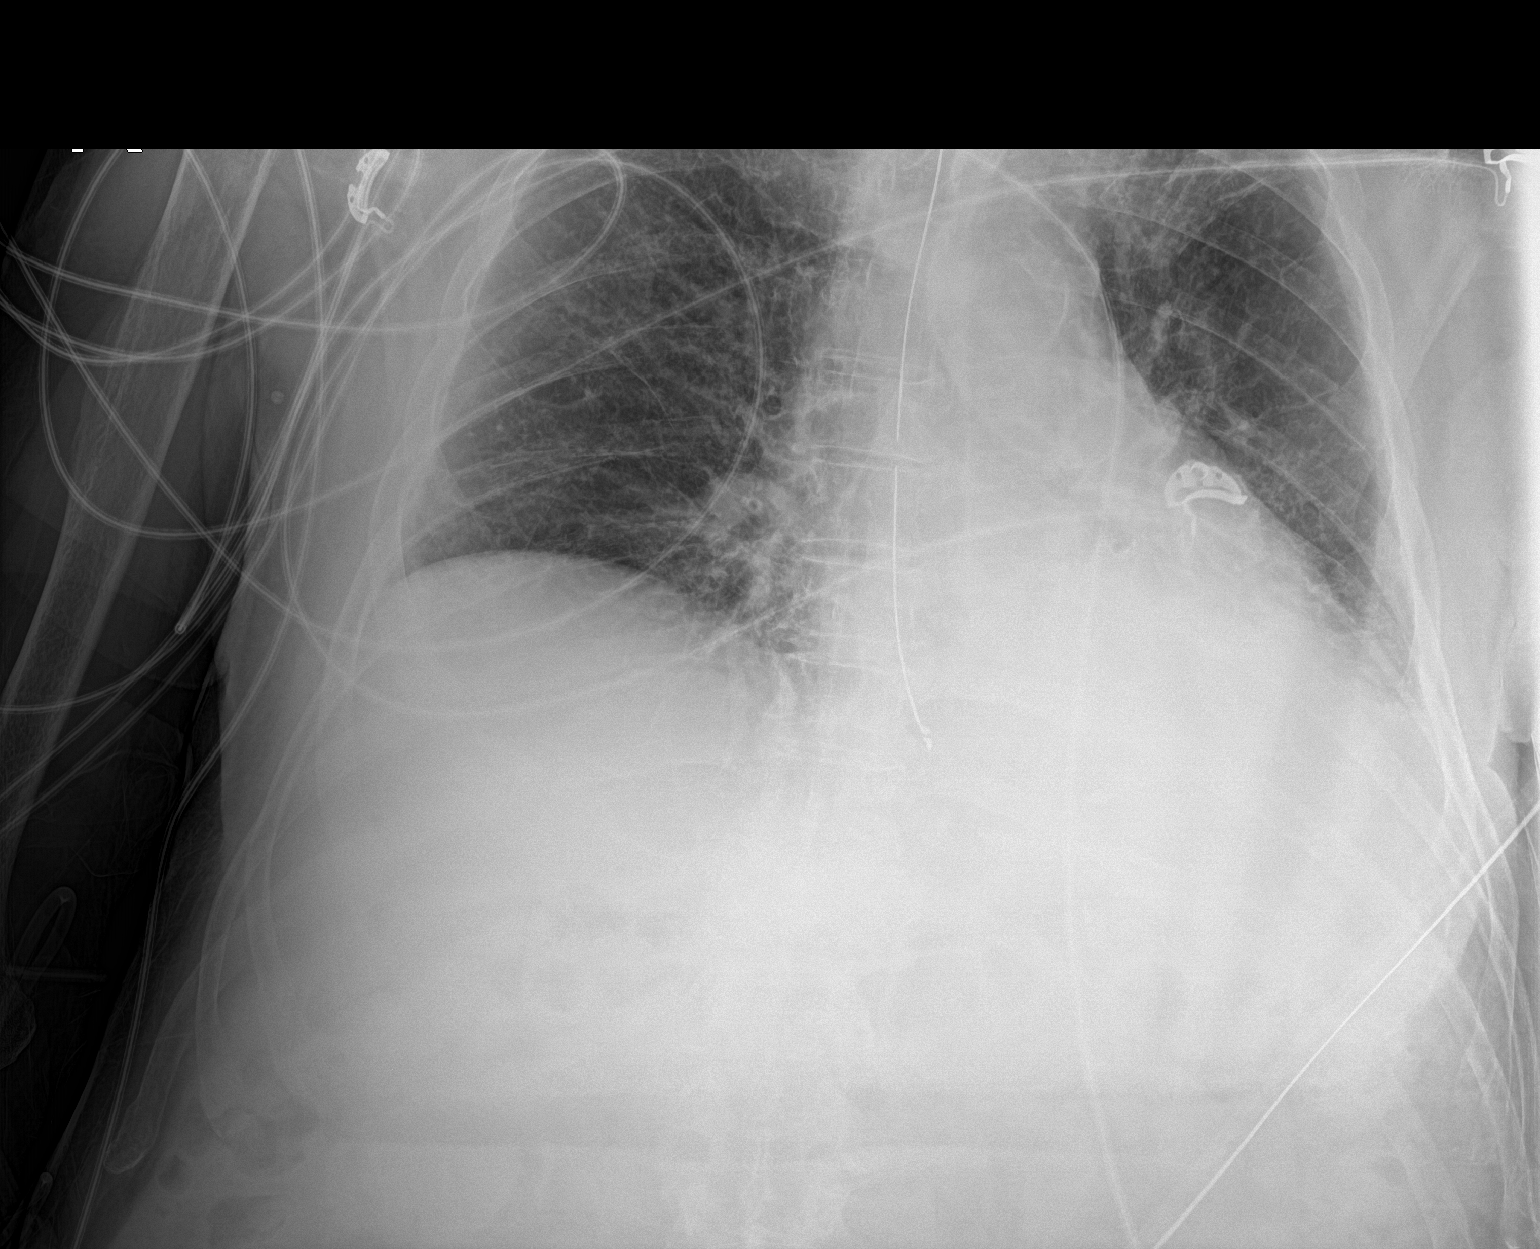

[chest ap (2 of 2)]
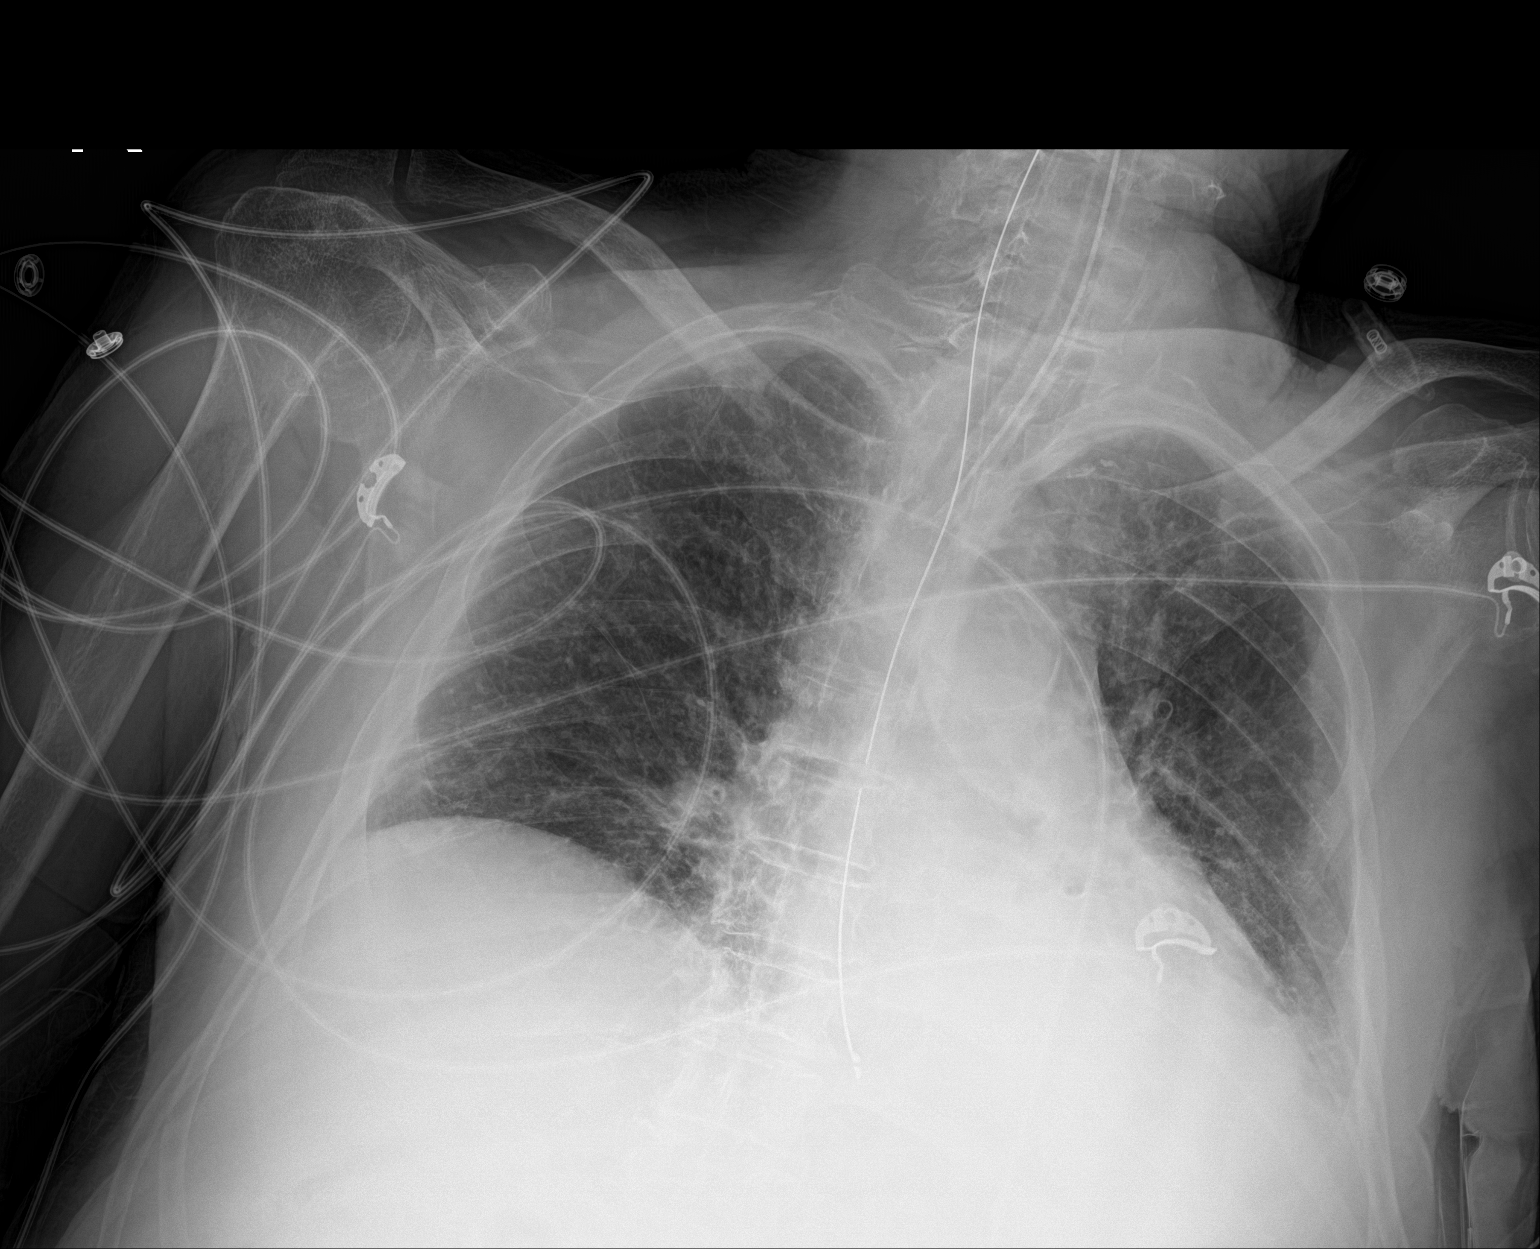

[2 of 2 positions shown; findings below may reference images not displayed]

FINDINGS: Interval placement of an endotracheal tube with tip approximately 6
cm above the carina. Enteric tube with side-port in the distal
esophagus and tip close to the GE junction. Recommend further
advancing of the enteric tube by at least additional 10 cm.

Diffuse interstitial coarsening and left lung base atelectasis
similar to prior radiograph. Stable cardiomediastinal silhouette.
Atherosclerotic calcification of the aorta. No acute osseous
pathology.
IMPRESSION: 1. Endotracheal tube above the carina.
2. Enteric tube with side-port in the distal esophagus and tip close
to the GE junction. Recommend further advancing of the enteric tube
by at least 10 cm.

## 2020-09-11 SURGERY — IR WITH ANESTHESIA
Anesthesia: General

## 2020-09-11 MED ORDER — LACTATED RINGERS IV SOLN: INTRAVENOUS | Status: DC | PRN

## 2020-09-11 MED ORDER — CLEVIDIPINE BUTYRATE 0.5 MG/ML IV EMUL
INTRAVENOUS | Status: DC | PRN
  Administered 2020-09-11: 1 mg/h via INTRAVENOUS

## 2020-09-11 MED ORDER — ACETAMINOPHEN 160 MG/5ML PO SOLN
650.0000 mg | ORAL | Status: DC | PRN
  Administered 2020-09-15 – 2020-09-20 (×4): 650 mg
  Filled 2020-09-11 (×4): qty 20.3

## 2020-09-11 MED ORDER — CLEVIDIPINE BUTYRATE 0.5 MG/ML IV EMUL
INTRAVENOUS | Status: AC
  Filled 2020-09-11: qty 50

## 2020-09-11 MED ORDER — EPTIFIBATIDE 20 MG/10ML IV SOLN
INTRAVENOUS | Status: AC
  Filled 2020-09-11: qty 10

## 2020-09-11 MED ORDER — SENNOSIDES-DOCUSATE SODIUM 8.6-50 MG PO TABS
1.0000 | ORAL_TABLET | Freq: Every evening | ORAL | Status: DC | PRN
Start: 2020-09-11 — End: 2020-09-23

## 2020-09-11 MED ORDER — SODIUM CHLORIDE 0.9% FLUSH
3.0000 mL | Freq: Once | INTRAVENOUS | Status: DC
Start: 2020-09-11 — End: 2020-09-26

## 2020-09-11 MED ORDER — IOHEXOL 350 MG/ML SOLN
40.0000 mL | Freq: Once | INTRAVENOUS | Status: AC | PRN
  Administered 2020-09-11: 40 mL via INTRAVENOUS

## 2020-09-11 MED ORDER — SODIUM CHLORIDE 0.9 % IV SOLN: INTRAVENOUS | Status: DC

## 2020-09-11 MED ORDER — ASPIRIN 81 MG PO CHEW
CHEWABLE_TABLET | ORAL | Status: AC
  Filled 2020-09-11: qty 1

## 2020-09-11 MED ORDER — PHENYLEPHRINE 40 MCG/ML (10ML) SYRINGE FOR IV PUSH (FOR BLOOD PRESSURE SUPPORT)
PREFILLED_SYRINGE | INTRAVENOUS | Status: DC | PRN
  Administered 2020-09-11 (×6): 80 ug via INTRAVENOUS

## 2020-09-11 MED ORDER — ESMOLOL HCL 100 MG/10ML IV SOLN
INTRAVENOUS | Status: DC | PRN
  Administered 2020-09-11 (×2): 20 mg via INTRAVENOUS

## 2020-09-11 MED ORDER — SODIUM CHLORIDE 0.9 % IV SOLN: INTRAVENOUS | Status: DC | PRN

## 2020-09-11 MED ORDER — NITROGLYCERIN 1 MG/10 ML FOR IR/CATH LAB
100.0000 ug | Freq: Once | INTRA_ARTERIAL | Status: AC
  Administered 2020-09-11: 50 ug via INTRA_ARTERIAL

## 2020-09-11 MED ORDER — PROPOFOL 500 MG/50ML IV EMUL
INTRAVENOUS | Status: DC | PRN
  Administered 2020-09-11: 25 ug/kg/min via INTRAVENOUS

## 2020-09-11 MED ORDER — INSULIN ASPART 100 UNIT/ML IJ SOLN
0.0000 [IU] | INTRAMUSCULAR | Status: DC
  Administered 2020-09-11: 5 [IU] via SUBCUTANEOUS
  Administered 2020-09-12 (×2): 3 [IU] via SUBCUTANEOUS

## 2020-09-11 MED ORDER — ROCURONIUM BROMIDE 10 MG/ML (PF) SYRINGE
PREFILLED_SYRINGE | INTRAVENOUS | Status: DC | PRN
  Administered 2020-09-11: 80 mg via INTRAVENOUS
  Administered 2020-09-11: 50 mg via INTRAVENOUS

## 2020-09-11 MED ORDER — TICAGRELOR 90 MG PO TABS
ORAL_TABLET | ORAL | Status: AC
  Filled 2020-09-11: qty 2

## 2020-09-11 MED ORDER — CEFAZOLIN SODIUM-DEXTROSE 2-4 GM/100ML-% IV SOLN: 2.0000 g | Freq: Once | INTRAVENOUS | Status: DC

## 2020-09-11 MED ORDER — PHENYLEPHRINE HCL-NACL 10-0.9 MG/250ML-% IV SOLN
INTRAVENOUS | Status: DC | PRN
  Administered 2020-09-11: 25 ug/min via INTRAVENOUS

## 2020-09-11 MED ORDER — IOHEXOL 300 MG/ML  SOLN
100.0000 mL | Freq: Once | INTRAMUSCULAR | Status: AC | PRN
  Administered 2020-09-11: 50 mL via INTRA_ARTERIAL

## 2020-09-11 MED ORDER — STROKE: EARLY STAGES OF RECOVERY BOOK
Freq: Once | Status: AC
  Filled 2020-09-11: qty 1

## 2020-09-11 MED ORDER — PROPOFOL 10 MG/ML IV BOLUS
INTRAVENOUS | Status: DC | PRN
  Administered 2020-09-11: 25 mg via INTRAVENOUS
  Administered 2020-09-11: 150 mg via INTRAVENOUS
  Administered 2020-09-11: 25 mg via INTRAVENOUS

## 2020-09-11 MED ORDER — IOHEXOL 300 MG/ML  SOLN
50.0000 mL | Freq: Once | INTRAMUSCULAR | Status: AC | PRN
  Administered 2020-09-11: 15 mL via INTRA_ARTERIAL

## 2020-09-11 MED ORDER — CANGRELOR TETRASODIUM 50 MG IV SOLR
INTRAVENOUS | Status: AC
  Filled 2020-09-11: qty 50

## 2020-09-11 MED ORDER — CLEVIDIPINE BUTYRATE 0.5 MG/ML IV EMUL
0.0000 mg/h | INTRAVENOUS | Status: DC
  Administered 2020-09-11: 2 mg/h via INTRAVENOUS
  Filled 2020-09-11: qty 50

## 2020-09-11 MED ORDER — TIROFIBAN HCL IN NACL 5-0.9 MG/100ML-% IV SOLN
INTRAVENOUS | Status: AC
  Filled 2020-09-11: qty 100

## 2020-09-11 MED ORDER — CHLORHEXIDINE GLUCONATE CLOTH 2 % EX PADS
6.0000 | MEDICATED_PAD | Freq: Every day | CUTANEOUS | Status: DC
  Administered 2020-09-12 – 2020-09-24 (×14): 6 via TOPICAL

## 2020-09-11 MED ORDER — PANTOPRAZOLE SODIUM 40 MG IV SOLR: 40.0000 mg | Freq: Every day | INTRAVENOUS | Status: DC

## 2020-09-11 MED ORDER — LIDOCAINE 2% (20 MG/ML) 5 ML SYRINGE
INTRAMUSCULAR | Status: DC | PRN
  Administered 2020-09-11: 60 mg via INTRAVENOUS

## 2020-09-11 MED ORDER — TETANUS-DIPHTH-ACELL PERTUSSIS 5-2.5-18.5 LF-MCG/0.5 IM SUSY
0.5000 mL | PREFILLED_SYRINGE | Freq: Once | INTRAMUSCULAR | Status: AC
  Administered 2020-09-11: 0.5 mL via INTRAMUSCULAR
  Filled 2020-09-11: qty 0.5

## 2020-09-11 MED ORDER — ACETAMINOPHEN 325 MG PO TABS
650.0000 mg | ORAL_TABLET | ORAL | Status: DC | PRN
  Filled 2020-09-11: qty 2

## 2020-09-11 MED ORDER — CEFAZOLIN SODIUM-DEXTROSE 2-3 GM-%(50ML) IV SOLR
INTRAVENOUS | Status: DC | PRN
  Administered 2020-09-11: 2 g via INTRAVENOUS

## 2020-09-11 MED ORDER — CLOPIDOGREL BISULFATE 300 MG PO TABS
ORAL_TABLET | ORAL | Status: AC
  Filled 2020-09-11: qty 1

## 2020-09-11 MED ORDER — SUCCINYLCHOLINE CHLORIDE 200 MG/10ML IV SOSY
PREFILLED_SYRINGE | INTRAVENOUS | Status: DC | PRN
  Administered 2020-09-11: 100 mg via INTRAVENOUS

## 2020-09-11 MED ORDER — NITROGLYCERIN 1 MG/10 ML FOR IR/CATH LAB
100.0000 ug | Freq: Once | INTRA_ARTERIAL | Status: DC
  Filled 2020-09-11: qty 10

## 2020-09-11 MED ORDER — IOHEXOL 300 MG/ML  SOLN
100.0000 mL | Freq: Once | INTRAMUSCULAR | Status: AC | PRN
  Administered 2020-09-11: 35 mL via INTRA_ARTERIAL

## 2020-09-11 MED ORDER — VERAPAMIL HCL 2.5 MG/ML IV SOLN
INTRAVENOUS | Status: AC
  Filled 2020-09-11: qty 2

## 2020-09-11 MED ORDER — EPHEDRINE SULFATE-NACL 50-0.9 MG/10ML-% IV SOSY
PREFILLED_SYRINGE | INTRAVENOUS | Status: DC | PRN
  Administered 2020-09-11 (×2): 2.5 mg via INTRAVENOUS

## 2020-09-11 MED ORDER — ACETAMINOPHEN 650 MG RE SUPP: 650.0000 mg | RECTAL | Status: DC | PRN

## 2020-09-11 NOTE — Transfer of Care (Signed)
Immediate Anesthesia Transfer of Care Note  Patient: Andre Adams  Procedure(s) Performed: IR WITH ANESTHESIA  Patient Location: ICU  Anesthesia Type:General  Level of Consciousness: Patient remains intubated per anesthesia plan  Airway & Oxygen Therapy: Patient remains intubated per anesthesia plan and Patient placed on Ventilator (see vital sign flow sheet for setting)  Post-op Assessment: Report given to RN and Post -op Vital signs reviewed and stable  Post vital signs: Reviewed and stable  Last Vitals:  Vitals Value Taken Time  BP 128/99 (ABP) 09/11/20 2212  Temp    Pulse 94 09/11/20 2219  Resp 16 09/11/20 2219  SpO2 100 % 09/11/20 2219  Vitals shown include unvalidated device data.  Last Pain:  Vitals:   09/11/20 1815  TempSrc: Rectal     Report to [facility] RN in ICU, RT at bedside, applied to ventilator, BP within goal range of 120-140 mmHg, propofol gtt infusing, VSS, full report given, informed ICU team of left wrist fracture, transfer of patient in safe and stable condition.    Complications: No notable events documented.

## 2020-09-11 NOTE — H&P (Addendum)
NEUROLOGY H&P   Date of service: September 11, 2020 Patient Name: Andre Adams MRN:  [id number] DOB:  1933-09-04 CC: acute ischemic R MCA stroke 2/2 R M2 occlusion  NOTE: PATIENT HAS 2 CHARTS, OTHER MRN = [id number] _ _ _   _ __   _ __ _ _  __ __   _ __   __ _  History of Present Illness   Andre Adams is a 87 y.o. male with PMH significant for DM2, CKD stage 3a, HTN, HL, hx a fib off AC 2/2 hx GIB and frequent falls who presented after fall and L sided weakness at ALF. LKW 1445, was walking with his walker down the hall, and fell to the floor. Noted to have flaccid L side arm/face/leg. Nonverbal. All are new deficits. BIB EMS and activated as code stroke. NIHSS = 20. CTH NAICP. tPA not administered 2/2 fall with head trauma. CTA showed prox R M2 occlusion. CTP with R MCA core infarct 61ml and large penumbra w/ mismatch 144 ml. Risks and benefits of thrombectomy were discussed with his wife by phone including ~10% risk of hemorrhage from the procedure. She gave informed consent to proceed. Dr. Corliss Skains was notified and patient was taken for neurointervention.  At baseline patient has mild cognitive impairment and walks with walker mRS = 3.  Above CNS imaging personally reviewed  Additional imaging performed:  CT maxillofacial: no fracture CT c spine: advanced DDD, no acute cervical spine fracture CXR: diffuse interstitial prominence underlying pulm edema vs atypical infection Pelvis XR single view: no fracture XR left wrist: Revisualization of sequela of prior impacted fracture of the distal radius and ulna. Evaluation for superimposed acute fracture is limited due to osteopenia and underlying chronic osseous remodeling. No definitive superimposed acute fracture is noted. If persistent clinical concern for scaphoid fracture, recommend immobilization and follow-up radiographs in 2 weeks versus MRI.    ROS   UTA 2/2 aphasia  Past History   See HPI  No family history on  file. Social History   Socioeconomic History   Marital status: Married    Spouse name: Not on file   Number of children: Not on file   Years of education: Not on file   Highest education level: Not on file  Occupational History   Not on file  Tobacco Use   Smoking status: Not on file   Smokeless tobacco: Not on file  Substance and Sexual Activity   Alcohol use: Not on file   Drug use: Not on file   Sexual activity: Not on file  Other Topics Concern   Not on file  Social History Narrative   Not on file   Social Determinants of Health   Financial Resource Strain: Not on file  Food Insecurity: Not on file  Transportation Needs: Not on file  Physical Activity: Not on file  Stress: Not on file  Social Connections: Not on file   Allergies  Allergen Reactions   Baclofen Other (See Comments)    lethargy    Medications   Unable to reconcile medications, patient aphasic and in IR    Vitals   Vitals:   09/11/20 1820 09/11/20 1830 09/11/20 1845 09/11/20 1900  BP: (S) 130/70 138/82 134/81 140/77  Pulse: 90 90 76 88  Resp: 18 (!) 23 18 19   Temp:      TempSrc:      SpO2: 98% 100% 99% 99%  Weight:  68 kg    Height:  5'  7" (1.702 m)       Body mass index is 23.49 kg/m.  Physical Exam   Physical Exam Gen: alert, unable to answer orientation questions 2/2 aphasia Resp: CTAB CV: RRR  Neuro: *MS: alert, unable to answer orientation questions 2/2 aphasia *Speech: nonverbal *CN: Pupils 16mm ERRL, blinks to threat bilat, tracks examiner, L proptosis, L UMN facial droop, hearing intact to voice *Motor & sensory: drift RUE, some movement against gravity RLE, no movement LUE, LLE only flexion to noxious stimuli *Gait, coordination: UTA  NIHSS  1a Level of Conscious.: 0 1b LOC Questions: 2 1c LOC Commands: 2 2 Best Gaze: 0 3 Visual: 0 4 Facial Palsy: 2 5a Motor Arm - left: 4 5b Motor Arm - Right: 1 6a Motor Leg - Left: 3 6b Motor Leg - Right: 2 7 Limb Ataxia:  0 8 Sensory: 1 9 Best Language: 3 10 Dysarthria: 0 11 Extinct. and Inatten.: 0  TOTAL: 20   Premorbid mRS = 3   Labs   CBC:  Recent Labs  Lab 09/11/20 1801  HGB 17.0  HCT 50.0    Basic Metabolic Panel:  Lab Results  Component Value Date   NA 136 09/11/2020   K 4.2 09/11/2020   CO2 21 (L) 09/11/2020   GLUCOSE 272 (H) 09/11/2020   BUN 44 (H) 09/11/2020   CREATININE 2.50 (H) 09/11/2020   CALCIUM 9.1 09/11/2020   GFRNONAA 24 (L) 09/11/2020   Lipid Panel: No results found for: LDLCALC HgbA1c: No results found for: HGBA1C Urine Drug Screen: No results found for: LABOPIA, COCAINSCRNUR, LABBENZ, AMPHETMU, THCU, LABBARB  Alcohol Level     Component Value Date/Time   ETH <10 09/11/2020 1845     Impression   Andre Adams is a 87 y.o. male with PMH significant for DM2, CKD stage 3a, HTN, HL, hx a fib off AC 2/2 hx GIB and frequent falls who presented after fall and L sided weakness at ALF. tPA not administered 2/2 fall with head trauma. CTA revealed R M2 occlusion with large penumbra on CTP. Patient was taken to IR for thrombectomy.  Recommendations   # Acute R MCA stroke 2/2 R M2 occlusion s/p thrombectomy - Admit to Neuro ICU post-procedure under Dr. Selina Cooley - BP parameters per IR, clevidipine gtt prn - MRI brain wo contrast - TTE - Check A1c and LDL + add statin per guidelines - Start antiplatelet tomorrow if no hemorrhage on post-procedure CT - q4 hr neuro checks - STAT head CT for any change in neuro exam - Tele - PT/OT/SLP when able to participate - Stroke education  #DM2 - A1c - SSI  #CKD s/p contrast - NS infusion 75 cc/hr overnight - BMP in AM - Avoid nephrotoxins  #Diffuse interstitial prominence on CXR pulm edema vs atypical infection - Repeat CXR tmrw  # L eye proptosis - Clarify with family if new, workup tomorrow after IR completed if indicated  #L wrist swelling. No fracture on XR. Patient currently in IR. Consider ortho consult  tomorrow if needed.  # Med rec - Unable to be performed; patient aphasic and in IR. Reconcile in AM with wife if she is able. ______________________________________________________________________    Bing Neighbors, MD [facility] [phone]  If 7pm- 7am, please page neurology on call as listed in AMION.    This patient is critically ill and at significant risk of neurological worsening, death and care requires constant monitoring of vital signs, hemodynamics,respiratory and cardiac monitoring, neurological assessment, discussion with  family, other specialists and medical decision making of high complexity. I spent 90 minutes of neurocritical care time  in the care of  this patient. This was time spent independent of any time provided by nurse practitioner or PA.  Bing Neighbors, MD [facility] [phone]  If 7pm- 7am, please page neurology on call as listed in AMION.

## 2020-09-11 NOTE — Anesthesia Procedure Notes (Signed)
Procedure Name: Intubation Date/Time: 09/11/2020 7:25 PM Performed by: Zollie Scale, CRNA Pre-anesthesia Checklist: Patient identified, Emergency Drugs available, Suction available and Patient being monitored Patient Re-evaluated:Patient Re-evaluated prior to induction Oxygen Delivery Method: Circle System Utilized Preoxygenation: Pre-oxygenation with 100% oxygen Induction Type: IV induction, Rapid sequence and Cricoid Pressure applied Laryngoscope Size: Glidescope and 4 Grade View: Grade I Tube type: Oral Tube size: 7.5 mm Number of attempts: 1 Airway Equipment and Method: Stylet and Video-laryngoscopy Placement Confirmation: ETT inserted through vocal cords under direct vision, positive ETCO2 and breath sounds checked- equal and bilateral Secured at: 22 cm Tube secured with: Tape Dental Injury: Teeth and Oropharynx as per pre-operative assessment  Comments: Glidescope utilized d/t C-collar in place. C-collar maintained during intubation.

## 2020-09-11 NOTE — Progress Notes (Signed)
Patient ID: Andre Adams, male   DOB: 1933-08-16, 87 y.o.   MRN: [id number] INR.  64 y RT H M MRS ?3 LSW 14.45.  New onset of Lt sided weakness and non verbal. CT Brain No ICH ASPECTS 10 CTA occluded Rt MCA sup division. CTP core of 86ml  versus mismatch of . Endovascular revascularization  treatment discussed  with spouse by Dr Selina Cooley .  Procedure, reasons ,alternatives  and potential risks reviewed. Spouse gave informed consent to proceed forward. S.Shant Hence MD

## 2020-09-11 NOTE — ED Notes (Signed)
Trauma Response Nurse Note-  Reason for Call / Reason for Trauma activation:   - Level two ground level fall with left eye protrusion  Initial Focused Assessment (If applicable, or please see trauma documentation):  - Confused male presented via EMS, ccollar in place, skin tears to left arm, slight swelling to left eye  Interventions:  - CTs, portable XRAYs, IV start and lab draw, TDAP, [place] J ccollar placement  Plan of Care as of this note:  - To IR  Event Summary:   - Patient arrives via EMS from assisted living facility after a ground level fall earlier in the afternoon today (approx 1445). Pt was not seen by facility staff for three hours, at which time he was noted to have left sided weakness and a code stroke was activated by EMS in the field. The patient arrived via EMS with a GCS of 12 (confused, withdraws from pain), usually GCS 15. EMS ccollar in place. Skin tears to left hand and left forearm just below [facility] that EMS caused while attempting IV. Deformity to left wrist, pain difficult to assess as patient initially not responding to pain on left side. TRN assisted stroke team in initial assessment and escorted immediately to CT. Moved to treatment room, secondary IV access initiated and labs drawn. TDAP given. Rectal temp. Changed EMS collar to [place] J for better fit. Escorted patient to IR with primary nurse for large vessel occlusion, handoff given CRNA and MD in IR.   The Following (if applicable):    -MD notified: Jenna Luo, Selina Cooley neurology     -Time of Page/Time of notification: 71    -TRN arrival Time: 24    -End time: 1902 transferred to IR  Please call TRN for further assistance. [phone]

## 2020-09-11 NOTE — Anesthesia Procedure Notes (Signed)
Arterial Line Insertion Start/End7/23/2022 7:22 PM, 09/11/2020 7:25 PM Performed by: Zollie Scale, CRNA, CRNA  Patient location: OOR procedure area. Emergency situation Right, radial was placed Catheter size: 20 G Hand hygiene performed  and Seldinger technique used Allen's test indicative of satisfactory collateral circulation Attempts: 1 Procedure performed without using ultrasound guided technique. Following insertion, Biopatch and dressing applied. Patient tolerated the procedure well with no immediate complications.

## 2020-09-11 NOTE — ED Triage Notes (Signed)
Per [facility] pt coming from [facility] nursing facility had a ground level fall around 2:45 pm. About 3 hours later patient was found with left sided deficits, abnormality to eye and non verbal. Patients baseline is alert & orientated x 4 and ambulatory. C-collar in place.

## 2020-09-11 NOTE — Progress Notes (Signed)
Neurology Brief Note  This is a 87 yo gentleman with hx a fib not on AC 2/2 hx GIB who presented after fall and L sided weakness at ALF. LKW 1445, was walking with his walker down the hall, and fell to the floor. Noted to have flaccid L side arm/face/leg. Nonverbal. All are new deficits. BIB EMS and activated as code stroke. NIHSS = 23. CTH NAICP. tPA not administered 2/2 fall with head trauma. CTA showed prox R M2 occlusion. CTP with R MCA core infarct 86ml and large penumbra w/ mismatch 144 ml. Risks and benefits of thrombectomy were discussed with his wife by phone including ~10% risk of hemorrhage from the procedure. She gave informed consent to proceed. Dr. Corliss Skains was notified and patient was taken for neurointervention.  Full stroke code note to follow.  Bing Neighbors, MD [facility] [phone]  If 7pm- 7am, please page neurology on call as listed in AMION.

## 2020-09-11 NOTE — Anesthesia Postprocedure Evaluation (Signed)
Anesthesia Post Note  Patient: Andre Adams  Procedure(s) Performed: IR WITH ANESTHESIA     Patient location during evaluation: SICU Anesthesia Type: General Level of consciousness: sedated Pain management: pain level controlled Vital Signs Assessment: post-procedure vital signs reviewed and stable Respiratory status: patient remains intubated per anesthesia plan Cardiovascular status: stable Postop Assessment: no apparent nausea or vomiting Anesthetic complications: no   No notable events documented.  Last Vitals:  Vitals:   09/11/20 2215 09/11/20 2216  BP: (!) 128/99   Pulse: (!) 101   Resp: 15   Temp:    SpO2: 100% 100%    Last Pain:  Vitals:   09/11/20 1815  TempSrc: Rectal                 Kennieth Rad

## 2020-09-11 NOTE — ED Provider Notes (Signed)
[facility] EMERGENCY DEPARTMENT Provider Note   CSN: [id number] Arrival date & time: 09/11/20  1754  An emergency department physician performed an initial assessment on this suspected stroke patient at 1755.  History No chief complaint on file.   Andre Adams is a 87 y.o. male.  The history is provided by the EMS personnel and medical records.  Andre Adams is a 87 y.o. male who presents to the Emergency Department complaining of code stroke. Level V caveat due to mental status change. History is provided by EMS. Patient was last known well at 245 this afternoon. He had a fall out of bed and was held back into his bed. Then when he was checked on he was noted to have left hemiparesis. His eye was also noted to be bulging out of his head.    No past medical history on file.  Patient Active Problem List   Diagnosis Date Noted   Acute ischemic right MCA stroke (HCC) 09/11/2020   Atrial fibrillation, chronic (HCC)          No family history on file.     Home Medications Prior to Admission medications   Not on File    Allergies    Baclofen  Review of Systems   Review of Systems  All other systems reviewed and are negative.  Physical Exam Updated Vital Signs BP 94/69   Pulse 83   Temp (!) 93.8 F (34.3 C) (Oral) Comment: rn notified, bear huggar applied  Resp 16   Ht 5\' 7"  (1.702 m)   Wt 68 kg   SpO2 100%   BMI 23.49 kg/m   Physical Exam Vitals and nursing note reviewed.  Constitutional:      Appearance: He is well-developed.  HENT:     Head: Normocephalic and atraumatic.     Comments: Left eye is propoptotic but there is no local ecchymosis or erythema. Cardiovascular:     Rate and Rhythm: Normal rate and regular rhythm.     Heart sounds: No murmur heard. Pulmonary:     Effort: Pulmonary effort is normal. No respiratory distress.     Breath sounds: Normal breath sounds.  Abdominal:     Palpations: Abdomen is soft.      Tenderness: There is no abdominal tenderness. There is no guarding or rebound.  Musculoskeletal:        General: No tenderness.     Comments: There is a swelling and deformity to the left wrist with a skin tear without local tenderness  Skin:    General: Skin is warm and dry.  Neurological:     Mental Status: He is alert.     Comments: Nonverbal. Right-sided gaze preference. Withdrawals to painful stimuli on the right side. He is paretic to the left upper and left lower extremity. He has left facial droop.  Psychiatric:     Comments: Unable to assess    ED Results / Procedures / Treatments   Labs (all labs ordered are listed, but only abnormal results are displayed) Labs Reviewed  COMPREHENSIVE METABOLIC PANEL - Abnormal; Notable for the following components:      Result Value   CO2 21 (*)    Glucose, Bld 279 (*)    BUN 44 (*)    Creatinine, Ser 2.57 (*)    Total Protein 6.3 (*)    Albumin 3.2 (*)    GFR, Estimated 24 (*)    All other components within normal limits  URINALYSIS, ROUTINE  W REFLEX MICROSCOPIC - Abnormal; Notable for the following components:   Color, Urine STRAW (*)    Glucose, UA >=500 (*)    Hgb urine dipstick SMALL (*)    Protein, ur 100 (*)    Leukocytes,Ua SMALL (*)    All other components within normal limits  LACTIC ACID, PLASMA - Abnormal; Notable for the following components:   Lactic Acid, Venous 3.0 (*)    All other components within normal limits  GLUCOSE, CAPILLARY - Abnormal; Notable for the following components:   Glucose-Capillary 254 (*)    All other components within normal limits  I-STAT CHEM 8, ED - Abnormal; Notable for the following components:   BUN 44 (*)    Creatinine, Ser 2.50 (*)    Glucose, Bld 272 (*)    Calcium, Ion 1.08 (*)    TCO2 19 (*)    All other components within normal limits  CBG MONITORING, ED - Abnormal; Notable for the following components:   Glucose-Capillary 274 (*)    All other components within normal limits   POCT I-STAT 7, (LYTES, BLD GAS, ICA,H+H) - Abnormal; Notable for the following components:   pCO2 arterial 31.9 (*)    pO2, Arterial 129 (*)    Bicarbonate 18.6 (*)    TCO2 20 (*)    Acid-base deficit 6.0 (*)    All other components within normal limits  RESP PANEL BY RT-PCR (FLU A&B, COVID) ARPGX2  MRSA NEXT GEN BY PCR, NASAL  PROTIME-INR  APTT  ETHANOL  HEMOGLOBIN A1C  LIPID PANEL  BASIC METABOLIC PANEL  CBC  BLOOD GAS, ARTERIAL  SAMPLE TO BLOOD BANK    EKG None  Radiology DG Wrist Complete Left  Result Date: 09/11/2020 CLINICAL DATA:  fall EXAM: LEFT HAND - COMPLETE 3+ VIEW; LEFT WRIST - COMPLETE 3+ VIEW COMPARISON:  December 15, 2019 common March 13, 2020 FINDINGS: Revisualization of the sequela of an impacted fracture of the distal LEFT radius and ulna. There is mature osseous bridging with scattered residual areas of lucency from prior fracture sites. Fracture fragments are in unchanged alignment. No definitive acute fracture. Vascular calcifications. Osteopenia. Degenerative changes throughout the DIPs and PIPs. IMPRESSION: Revisualization of sequela of prior impacted fracture of the distal radius and ulna. Evaluation for superimposed acute fracture is limited due to osteopenia and underlying chronic osseous remodeling. No definitive superimposed acute fracture is noted. If persistent clinical concern for scaphoid fracture, recommend immobilization and follow-up radiographs in 2 weeks versus MRI. Electronically Signed   By: Meda Klinefelter MD   On: 09/11/2020 19:17   CT Cervical Spine Wo Contrast  Result Date: 09/11/2020 CLINICAL DATA:  Facial trauma. EXAM: CT CERVICAL SPINE WITHOUT CONTRAST TECHNIQUE: Multidetector CT imaging of the cervical spine was performed without intravenous contrast. Multiplanar CT image reconstructions were also generated. COMPARISON:  None. FINDINGS: Alignment: No evidence of acute traumatic subluxation. Skull base and vertebrae: No acute cervical  spine fracture or suspicious osseous lesion. Mild superior endplate compression fracture at T3, likely chronic. Soft tissues and spinal canal: No prevertebral fluid or swelling. No visible canal hematoma. Disc levels: Moderate to severe disc space narrowing from C4-5 to C6-7. Severe bilateral facet arthrosis at C3-4 with small subchondral cysts or erosions and joint widening on the right. Right facet ankylosis at C4-5. No evidence of high-grade spinal stenosis or high-grade neural foraminal stenosis. Upper chest: No apical lung consolidation or mass. Other: Subcentimeter thyroid nodules for which no imaging follow-up is recommended. Carotid atherosclerosis. IMPRESSION: 1.  No acute cervical spine fracture. 2. Advanced cervical disc and facet degeneration. Electronically Signed   By: Sebastian Ache M.D.   On: 09/11/2020 19:01   DG Pelvis Portable  Result Date: 09/11/2020 CLINICAL DATA:  trauma EXAM: PORTABLE PELVIS 1-2 VIEWS COMPARISON:  December 15, 2019 FINDINGS: Osteopenia. Excreted contrast limits evaluation of the sacrum. No acute displaced fracture seen on single view. No pelvic diastasis. Vascular calcifications. Degenerative changes of the lumbar spine. IMPRESSION: No acute fracture on single view. If persistent concern for nondisplaced hip or pelvic fracture, recommend dedicated pelvic MRI. Electronically Signed   By: Meda Klinefelter MD   On: 09/11/2020 19:13   Portable Chest x-ray  Result Date: 09/11/2020 CLINICAL DATA:  87 year old male status post intubation. EXAM: PORTABLE CHEST 1 VIEW COMPARISON:  Earlier radiograph dated 09/11/2020. FINDINGS: Interval placement of an endotracheal tube with tip approximately 6 cm above the carina. Enteric tube with side-port in the distal esophagus and tip close to the GE junction. Recommend further advancing of the enteric tube by at least additional 10 cm. Diffuse interstitial coarsening and left lung base atelectasis similar to prior radiograph. Stable  cardiomediastinal silhouette. Atherosclerotic calcification of the aorta. No acute osseous pathology. IMPRESSION: 1. Endotracheal tube above the carina. 2. Enteric tube with side-port in the distal esophagus and tip close to the GE junction. Recommend further advancing of the enteric tube by at least 10 cm. Electronically Signed   By: Elgie Collard M.D.   On: 09/11/2020 23:08   DG Chest Portable 1 View  Result Date: 09/11/2020 CLINICAL DATA:  trauma EXAM: PORTABLE CHEST 1 VIEW COMPARISON:  October 23, 2019, October 03, 2019 FINDINGS: Evaluation is limited secondary to patient rotation. The cardiomediastinal silhouette is grossly unchanged in contour. No pleural effusion. No pneumothorax. Diffuse interstitial prominence. Visualized abdomen is unremarkable. Compression fracture deformity of the T11, unchanged in comparison to prior. Compression fracture of T9 appears similar comparison to prior. IMPRESSION: Diffuse interstitial prominence likely reflecting underlying pulmonary edema. Differential considerations include atypical infection. Electronically Signed   By: Meda Klinefelter MD   On: 09/11/2020 19:12   DG Hand Complete Left  Result Date: 09/11/2020 CLINICAL DATA:  fall EXAM: LEFT HAND - COMPLETE 3+ VIEW; LEFT WRIST - COMPLETE 3+ VIEW COMPARISON:  December 15, 2019 common March 13, 2020 FINDINGS: Revisualization of the sequela of an impacted fracture of the distal LEFT radius and ulna. There is mature osseous bridging with scattered residual areas of lucency from prior fracture sites. Fracture fragments are in unchanged alignment. No definitive acute fracture. Vascular calcifications. Osteopenia. Degenerative changes throughout the DIPs and PIPs. IMPRESSION: Revisualization of sequela of prior impacted fracture of the distal radius and ulna. Evaluation for superimposed acute fracture is limited due to osteopenia and underlying chronic osseous remodeling. No definitive superimposed acute fracture  is noted. If persistent clinical concern for scaphoid fracture, recommend immobilization and follow-up radiographs in 2 weeks versus MRI. Electronically Signed   By: Meda Klinefelter MD   On: 09/11/2020 19:17   CT HEAD CODE STROKE WO CONTRAST  Result Date: 09/11/2020 CLINICAL DATA:  Code stroke. Neuro deficit, acute, stroke suspected. EXAM: CT HEAD WITHOUT CONTRAST TECHNIQUE: Contiguous axial images were obtained from the base of the skull through the vertex without intravenous contrast. COMPARISON:  09/27/2019 FINDINGS: Brain: There is no evidence of an acute infarct, intracranial hemorrhage, mass, midline shift, or extra-axial fluid collection. There is mild-to-moderate cerebral atrophy. Patchy hypodensities in the cerebral white matter bilaterally are similar to the prior  motion degraded CT and are nonspecific but compatible with moderate chronic small vessel ischemic disease. A chronic infarct is again noted in the left basal ganglia. There is also a small chronic right cerebellar infarct. Vascular: Calcified atherosclerosis at the skull base. No hyperdense vessel. Skull: No fracture or suspicious osseous lesion. Sinuses/Orbits: Paranasal sinuses and mastoid air cells are clear. Bilateral cataract extraction. Other: None. ASPECTS [facility] Stroke Program Early CT Score) - Ganglionic level infarction (caudate, lentiform nuclei, internal capsule, insula, M1-M3 cortex): 7 - Supraganglionic infarction (M4-M6 cortex): 3 Total score (0-10 with 10 being normal): 10 IMPRESSION: 1. No evidence of acute intracranial abnormality. 2. ASPECTS is 10. 3. Moderate chronic small vessel ischemic disease. These results were communicated to Dr. Selina Cooley at 6:21 pm on 09/11/2020 by text page via the [facility] messaging system. Electronically Signed   By: Sebastian Ache M.D.   On: 09/11/2020 18:23   CT ANGIO HEAD NECK W WO CM W PERF (CODE STROKE)  Result Date: 09/11/2020 CLINICAL DATA:  Neuro deficit, acute, stroke suspected. EXAM: CT  ANGIOGRAPHY HEAD AND NECK CT PERFUSION BRAIN TECHNIQUE: Multidetector CT imaging of the head and neck was performed using the standard protocol during bolus administration of intravenous contrast. Multiplanar CT image reconstructions and MIPs were obtained to evaluate the vascular anatomy. Carotid stenosis measurements (when applicable) are obtained utilizing NASCET criteria, using the distal internal carotid diameter as the denominator. Multiphase CT imaging of the brain was performed following IV bolus contrast injection. Subsequent parametric perfusion maps were calculated using RAPID software. CONTRAST:  55mL OMNIPAQUE IOHEXOL 350 MG/ML SOLN COMPARISON:  None. FINDINGS: CTA NECK FINDINGS Aortic arch: Normal variant aortic arch branching pattern with common origin of the brachiocephalic and left common carotid arteries. Mild atherosclerotic plaque without arch vessel origin stenosis. Right carotid system: Patent with predominantly calcified plaque at the carotid bifurcation. No evidence of a significant stenosis or dissection. Left carotid system: Patent with mixed calcified and soft plaque at the carotid bifurcation. No evidence of a significant stenosis or dissection. Vertebral arteries: Patent with the left being mildly dominant. Scattered atherosclerosis bilaterally resulting in severe proximal V1 and mild V3 stenoses on the left. Skeleton: See separate cervical spine CT report. Other neck: Subcentimeter thyroid nodules for which no imaging follow-up is recommended. Upper chest: Mild motion artifact. No apical lung consolidation or mass. Review of the MIP images confirms the above findings CTA HEAD FINDINGS Anterior circulation: The internal carotid arteries are patent from skull base to carotid termini with atherosclerotic plaque resulting in moderate bilateral paraclinoid stenoses. The right M1 segment is patent, however there is a proximal M2 occlusion without significant distal reconstitution. The left  MCA is patent with a mild M1 stenosis noted. The ACAs are patent with mild-to-moderate stenosis of the left A1 segment as well as moderate irregular narrowing of both A2 segments. No aneurysm is identified. Posterior circulation: The intracranial vertebral arteries are patent to the basilar with atherosclerosis resulting in irregularity bilaterally and a moderate stenosis on the right. The basilar artery is patent with a severe stenosis in its midportion. The right PCA is patent with a severe stenosis near the P1-P2 junction. There are severe stenoses of both PCAs near the P1-P2 junctions with poor flow distally, particularly on the left. No aneurysm is identified. Venous sinuses: As permitted by contrast timing, patent. Anatomic variants: None. Review of the MIP images confirms the above findings CT Brain Perfusion Findings: ASPECTS: 10 CBF (<30%) Volume: 40 mL Perfusion (Tmax>6.0s) volume: 184 mL Mismatch  Volume: 144 mL Infarction Location: Right frontal and temporal lobes primarily at the level of the operculum (MCA territory) IMPRESSION: 1. Proximal right M2 occlusion. 2. CTP demonstrates a right MCA core infarct with large penumbra as detailed above. 3. Advanced intracranial atherosclerosis including moderate bilateral ICA stenoses, a severe basilar artery stenosis, and severe bilateral proximal PCA stenoses. 4. Cervical carotid atherosclerosis without significant stenosis. 5. Severe proximal left vertebral artery stenosis. Emergent findings were communicated to Dr. Selina Cooley at 6:35 pm on 09/11/2020 by text page via the [facility] messaging system. Electronically Signed   By: Sebastian Ache M.D.   On: 09/11/2020 18:51   CT Maxillofacial Wo Contrast  Result Date: 09/11/2020 CLINICAL DATA:  Facial trauma. EXAM: CT MAXILLOFACIAL WITHOUT CONTRAST TECHNIQUE: Multidetector CT imaging of the maxillofacial structures was performed. Multiplanar CT image reconstructions were also generated. COMPARISON:  Head CT 09/27/2019  FINDINGS: Osseous: No acute fracture, mandibular dislocation, or destructive osseous process. Orbits: Bilateral cataract extraction and proptosis. No orbital hematoma or mass. Sinuses: Paranasal sinuses and mastoid air cells are clear. Mild rightward nasal septal deviation. Soft tissues: Punctate left parotid calcification.  Atherosclerosis. Limited intracranial: More fully evaluated on separate head CT. IMPRESSION: No acute maxillofacial fracture. Electronically Signed   By: Sebastian Ache M.D.   On: 09/11/2020 18:55    Procedures Procedures   Medications Ordered in ED Medications  sodium chloride flush (NS) 0.9 % injection 3 mL (has no administration in time range)   stroke: mapping our early stages of recovery book (has no administration in time range)  0.9 %  sodium chloride infusion ( Intravenous Infusion Verify 09/12/20 0000)  acetaminophen (TYLENOL) tablet 650 mg (has no administration in time range)    Or  acetaminophen (TYLENOL) 160 MG/5ML solution 650 mg (has no administration in time range)    Or  acetaminophen (TYLENOL) suppository 650 mg (has no administration in time range)  senna-docusate (Senokot-S) tablet 1 tablet (has no administration in time range)  pantoprazole (PROTONIX) injection 40 mg (has no administration in time range)  clevidipine (CLEVIPREX) infusion 0.5 mg/mL (0 mg/hr Intravenous Stopped 09/11/20 2306)  nitroGLYCERIN 1 mg/10 mL (100 mcg/mL) - IR/CATH LAB (has no administration in time range)  insulin aspart (novoLOG) injection 0-9 Units (5 Units Subcutaneous Given 09/11/20 2340)  Chlorhexidine Gluconate Cloth 2 % PADS 6 each (has no administration in time range)  iohexol (OMNIPAQUE) 350 MG/ML injection 40 mL (40 mLs Intravenous Contrast Given 09/11/20 1825)  Tdap (BOOSTRIX) injection 0.5 mL (0.5 mLs Intramuscular Given 09/11/20 1902)  clevidipine (CLEVIPREX) 0.5 MG/ML infusion (  Override pull for Anesthesia 09/11/20 2037)  propofol (DIPRIVAN) 1000 MG/100ML infusion (   New Bag/Given 09/12/20 0019)    ED Course  I have reviewed the triage vital signs and the nursing notes.  Pertinent labs & imaging results that were available during my care of the patient were reviewed by me and considered in my medical decision making (see chart for details).    MDM Rules/Calculators/A&P                          patient presents the emergency department as a level II trauma alert following fall as well as a code stroke. On evaluation patient has left hemiparesis and right gaze preference. He also does have what appears to be proptosis to the left eye but there is no local trauma. He does have ecchymosis skin tear and swelling to the left wrist, no evidence of additional  traumatic injuries. CT head was negative for trauma and CT max face does not demonstrate propptosis that appears on examination - may be due to patient's facial grimace on examination. He does have an acute CVA, plan to transfer to IR for ongoing intervention.  Final Clinical Impression(s) / ED Diagnoses Final diagnoses:  Acute CVA (cerebrovascular accident) [facility](HCC)    Rx / DC Orders ED Discharge Orders     None        Tilden Fossaees, Ismail Graziani, MD 09/12/20 0022

## 2020-09-11 NOTE — Anesthesia Preprocedure Evaluation (Signed)
Anesthesia Evaluation  Patient identified by MRN, date of birth, ID band Patient unresponsive  Preop documentation limited or incomplete due to emergent nature of procedure.  Airway Mallampati: II  TM Distance: >3 FB Neck ROM: Limited   Comment: C-collar in place Dental  (+) Dental Advisory Given   Pulmonary    breath sounds clear to auscultation       Cardiovascular  Rhythm:Irregular Rate:Normal     Neuro/Psych CVA    GI/Hepatic   Endo/Other    Renal/GU      Musculoskeletal   Abdominal   Peds  Hematology   Anesthesia Other Findings   Reproductive/Obstetrics                             Lab Results  Component Value Date   HGB 17.0 09/11/2020   HCT 50.0 09/11/2020   Lab Results  Component Value Date   CREATININE 2.50 (H) 09/11/2020   BUN 44 (H) 09/11/2020   NA 136 09/11/2020   K 4.2 09/11/2020   CL 105 09/11/2020   CO2 21 (L) 09/11/2020    Anesthesia Physical Anesthesia Plan  ASA: 4 and emergent  Anesthesia Plan: General   Post-op Pain Management:    Induction: Intravenous and Rapid sequence  PONV Risk Score and Plan: Treatment may vary due to age or medical condition  Airway Management Planned: Oral ETT  Additional Equipment: Arterial line  Intra-op Plan:   Post-operative Plan: Possible Post-op intubation/ventilation  Informed Consent: I have reviewed the patients History and Physical, chart, labs and discussed the procedure including the risks, benefits and alternatives for the proposed anesthesia with the patient or authorized representative who has indicated his/her understanding and acceptance.     Dental advisory given  Plan Discussed with: CRNA  Anesthesia Plan Comments:         Anesthesia Quick Evaluation

## 2020-09-11 NOTE — Consult Note (Signed)
NAME:  Andre Adams, MRN:  [id number], DOB:  [birth year], LOS: 0 ADMISSION DATE:  09/11/2020, CONSULTATION DATE:  7/23 REFERRING MD:  Selina Cooley MD, CHIEF COMPLAINT:   fall and left sided weakness   History of Present Illness:  Patient is encephalopathic and/or intubated. Therefore history has been obtained from chart review.   Andre Adams is an 87 y.o. male  who is seen in consultation at the request of Dr. Selina Cooley for recommendations on further evaluation and management of mechanical ventilation. The patient presented from [facility] nursing facility  to [facility] ED  on 7/23 via [facility] with complaints of fall and left sided weakness. The patient was admitted to Neurology service for an acute ischemic R MCA stroke secondary to a R M2 occlusion.  It is reported that he has a mild cognitive impalement and walks with a walker. Marland Kitchen He presented to the [facility] ED around [facility] and was found to have left sided weaknesswith flaccid L dide arm/face/leg and was nonverbal. LKW 1445. NIHSS score 23. TPA was not give due to fall with head trauma. CTA head showed R MCA stroke secondary to a R M2 occlusion. He was taken to [facility] with Dr. Corliss Skains. At this time a NIR procedure note is not available.   He has a reported pmx of HTN, HL, Afib off AC due to GIB HX, CKD3, frequent falls  Pertinent  Medical History  HTN, HL, Afib off AC due to GIB HX, CKD3, frequent falls  Significant Hospital Events: Including procedures, antibiotic start and stop dates in addition to other pertinent events   7/23 Admit  Interim History / Subjective:  See above  Unable to obtain subjective evaluation due to patient status Objective   Blood pressure 140/77, pulse 88, temperature (!) 96.3 F (35.7 C), temperature source Rectal, resp. rate 19, height 5\' 7"  (1.702 m), weight 68 kg, SpO2 100 %.    Vent Mode: PRVC FiO2 (%):  [40 %] 40 % Set Rate:  [16 bmp] 16 bmp Vt Set:  [520 mL] 520 mL PEEP:  [5 cmH20] 5 cmH20    Intake/Output Summary (Last 24 hours) at 09/11/2020 2230 Last data filed at 09/11/2020 2210 Gross per 24 hour  Intake 1650 ml  Output 25 ml  Net 1625 ml   Filed Weights   09/11/20 1830  Weight: 68 kg   On exam on 25 of propofol. Nursing concerned regarding paralytic administration post procedure.   Examination: General:  frail, in bed, NAD HEENT: MM pink/moist, anicteric, Trachea midline Neuro: sedated, PErRL 54mm, not breathing over ventilator CV: S1S2, afib on monitor, no m/r/g appreciated PULM:  clear in the upper lobes and clear in the lower lobes, chest expansion symmetric, not breathing over ventilator GI: soft, bsx4 active, nondistended Extremities: warm/dry, no pretibial edema, capillary refill greater/less than 3 seconds  Skin: skin tear left arm, no rashes or lesions noted   Resolved Hospital Problem list     Assessment & Plan:  R MCA stroke secondary to a R M2 occlusion S/P thrombectomy Etiology unknown. Awaiting NIR note for procedure details. -Management and workup per neurology -Follow up MRI wo contrast -Follow up TTE -Follow A1C, LDL. GDMT per neuro -Continue neuro checks -PT/OT/Tele -Admit to ICU -Cleviprex GTT. SBP goal per neuro medication orders. SBP 100-180. DBP < 105 -ASA per neuro -Hold propofol 1 hr post procedure for neuro exam.  Endotracheally Intubated Intubated for airway protection and NIR. CXR concerning for some pulmonary edema. Afebrile. No wbc  level at this time. -LTVV strategy with tidal volumes of 4-8 cc/kg ideal body weight -Goal plateau pressures of 30 and driving pressures of 15 -Wean PEEP/FiO2 for SpO2 92% or greater -Follow up CXR and ABG PRN -VAP bundle -Daily SAT and SBT. Hope to extubate in AM -PAD bundle with Propofol gtt. Goal RASS 0 titrate to goal  Lactic Acidosis Lactate 3.0, documented +1.7 L since admission. Suspect related to increased demand. -Fluid resusitated -Trend  DM2 -Start SSI -Blood Glucose goal  140-180.  HX Atrial fibrillation off AC due to GIB hx Afib on EKG -Rate currently controlled -No current documentation of home medication -AC per neurology  CKD3 on AKI Creat 2.50. Unclear baseline -Ensure renal perfusion. Goal MAP 65 or greater. -Avoid neprotoxic drugs as possible. -Strict I&O's -Follow up AM creatinine -Obtain bladder scan and cath if volume greater than 300 -LR at 75 for hydration  HX GIB -Monitor for bleeding  L eye proptosis L wrist swelling -Management per primary  Best Practice (right click and "Reselect all SmartList Selections" daily)   Diet/type: NPO DVT prophylaxis: SCD GI prophylaxis: PPI Lines: N/A Foley:  N/A Code Status:  full code Last date of multidisciplinary goals of care discussion [Per primary]  Labs   CBC: Recent Labs  Lab 09/11/20 1801  HGB 17.0  HCT 50.0    Basic Metabolic Panel: Recent Labs  Lab 09/11/20 1800 09/11/20 1801  NA 136 136  K 4.4 4.2  CL 103 105  CO2 21*  --   GLUCOSE 279* 272*  BUN 44* 44*  CREATININE 2.57* 2.50*  CALCIUM 9.1  --    GFR: Estimated Creatinine Clearance: 20.6 mL/min (A) (by C-G formula based on SCr of 2.5 mg/dL (H)). Recent Labs  Lab 09/11/20 1845  LATICACIDVEN 3.0*    Liver Function Tests: Recent Labs  Lab 09/11/20 1800  AST 18  ALT 10  ALKPHOS 87  BILITOT 0.9  PROT 6.3*  ALBUMIN 3.2*   No results for input(s): LIPASE, AMYLASE in the last 168 hours. No results for input(s): AMMONIA in the last 168 hours.  ABG    Component Value Date/Time   TCO2 19 (L) 09/11/2020 1801     Coagulation Profile: Recent Labs  Lab 09/11/20 1800  INR 1.1    Cardiac Enzymes: No results for input(s): CKTOTAL, CKMB, CKMBINDEX, TROPONINI in the last 168 hours.  HbA1C: No results found for: HGBA1C  CBG: Recent Labs  Lab 09/11/20 1759  GLUCAP 274*    Review of Systems:   Unable to obtain a ROS evaluation due to patient status   Past Medical History:  He,  has no past  medical history on file.   Surgical History:  No known history at this time  Social History:    No known history at this time  Family History:  His family history is not on file.   Allergies Allergies  Allergen Reactions   Baclofen Other (See Comments)    lethargy     Home Medications  Prior to Admission medications   Not on File     Critical care time: 31 minutes    Gershon Mussel., MSN, APRN, AGACNP-BC [facility]  09/12/2020 , 1:39 AM  Please see [email or web address] for pager details  If no response, please call [phone] After hours, please call Elink at [phone]

## 2020-09-11 NOTE — Progress Notes (Signed)
Orthopedic Tech Progress Note Patient Details:  Andre Adams Apr 08, 1933 [id number] Level 2 Trauma Patient ID: [name], male   DOB: 22-Aug-1933, 87 y.o.   MRN: [id number]  [name] 09/11/2020, 5:58 PM

## 2020-09-11 NOTE — Procedures (Signed)
S/P RT common carotid arteriogram  RT CFA approach. S/P revascularization of occluded dup division Rt MCA  with x 1 pass woth solitaire x 40 mm retriever and contact aspiration achieving a TICI 2C revascularization. Post CT brain.contrast stain in the Rt peri insular and ant parietal cortical and subcortical regions.   Manual compression and quick clot  applied for hemostasis at groin puncture site for 20 mins . Patient left intubated to protect airway. Distal pulses all dopplerable . Pupils 2 to 3 mm Rt = Lt reactive. S.British Moyd MD

## 2020-09-12 ENCOUNTER — Other Ambulatory Visit: Payer: Self-pay

## 2020-09-12 ENCOUNTER — Inpatient Hospital Stay (HOSPITAL_COMMUNITY): Payer: HMO

## 2020-09-12 ENCOUNTER — Other Ambulatory Visit (HOSPITAL_COMMUNITY): Payer: HMO

## 2020-09-12 ENCOUNTER — Encounter (HOSPITAL_COMMUNITY): Payer: Self-pay | Admitting: Neurology

## 2020-09-12 ENCOUNTER — Other Ambulatory Visit: Payer: Self-pay | Admitting: Student

## 2020-09-12 DIAGNOSIS — I482 Chronic atrial fibrillation, unspecified: Secondary | ICD-10-CM | POA: Diagnosis not present

## 2020-09-12 DIAGNOSIS — I63511 Cerebral infarction due to unspecified occlusion or stenosis of right middle cerebral artery: Secondary | ICD-10-CM | POA: Diagnosis not present

## 2020-09-12 DIAGNOSIS — N179 Acute kidney failure, unspecified: Secondary | ICD-10-CM | POA: Diagnosis not present

## 2020-09-12 DIAGNOSIS — I639 Cerebral infarction, unspecified: Secondary | ICD-10-CM | POA: Diagnosis not present

## 2020-09-12 DIAGNOSIS — E1165 Type 2 diabetes mellitus with hyperglycemia: Secondary | ICD-10-CM

## 2020-09-12 DIAGNOSIS — Z978 Presence of other specified devices: Secondary | ICD-10-CM | POA: Diagnosis not present

## 2020-09-12 DIAGNOSIS — Z8719 Personal history of other diseases of the digestive system: Secondary | ICD-10-CM

## 2020-09-12 DIAGNOSIS — I6389 Other cerebral infarction: Secondary | ICD-10-CM | POA: Diagnosis not present

## 2020-09-12 LAB — ECHOCARDIOGRAM COMPLETE
AR max vel: 1.34 cm2
AV Area VTI: 1.42 cm2
AV Area mean vel: 1.3 cm2
AV Mean grad: 11 mmHg
AV Peak grad: 16.9 mmHg
Ao pk vel: 2.06 m/s
Height: 67 in
S' Lateral: 2.6 cm
Weight: 2400 oz

## 2020-09-12 LAB — BASIC METABOLIC PANEL
Anion gap: 10 (ref 5–15)
BUN: 37 mg/dL — ABNORMAL HIGH (ref 8–23)
CO2: 18 mmol/L — ABNORMAL LOW (ref 22–32)
Calcium: 8 mg/dL — ABNORMAL LOW (ref 8.9–10.3)
Chloride: 108 mmol/L (ref 98–111)
Creatinine, Ser: 2.27 mg/dL — ABNORMAL HIGH (ref 0.61–1.24)
GFR, Estimated: 28 mL/min — ABNORMAL LOW (ref >60)
Glucose, Bld: 247 mg/dL — ABNORMAL HIGH (ref 70–99)
Potassium: 4.3 mmol/L (ref 3.5–5.1)
Sodium: 136 mmol/L (ref 135–145)

## 2020-09-12 LAB — LACTIC ACID, PLASMA: Lactic Acid, Venous: 3.1 mmol/L (ref 0.5–1.9)

## 2020-09-12 LAB — LIPID PANEL
Cholesterol: 147 mg/dL (ref 0–200)
HDL: 27 mg/dL — ABNORMAL LOW (ref >40)
LDL Cholesterol: 100 mg/dL — ABNORMAL HIGH (ref 0–99)
Total CHOL/HDL Ratio: 5.4 RATIO
Triglycerides: 99 mg/dL (ref <150)
VLDL: 20 mg/dL (ref 0–40)

## 2020-09-12 LAB — CBC
HCT: 40.7 % (ref 39.0–52.0)
Hemoglobin: 13.3 g/dL (ref 13.0–17.0)
MCH: 32.3 pg (ref 26.0–34.0)
MCHC: 32.7 g/dL (ref 30.0–36.0)
MCV: 98.8 fL (ref 80.0–100.0)
Platelets: 134 10*3/uL — ABNORMAL LOW (ref 150–400)
RBC: 4.12 MIL/uL — ABNORMAL LOW (ref 4.22–5.81)
RDW: 12.9 % (ref 11.5–15.5)
WBC: 11.4 10*3/uL — ABNORMAL HIGH (ref 4.0–10.5)
nRBC: 0 % (ref 0.0–0.2)

## 2020-09-12 LAB — GLUCOSE, CAPILLARY
Glucose-Capillary: 210 mg/dL — ABNORMAL HIGH (ref 70–99)
Glucose-Capillary: 201 mg/dL — ABNORMAL HIGH (ref 70–99)
Glucose-Capillary: 162 mg/dL — ABNORMAL HIGH (ref 70–99)
Glucose-Capillary: 161 mg/dL — ABNORMAL HIGH (ref 70–99)
Glucose-Capillary: 144 mg/dL — ABNORMAL HIGH (ref 70–99)
Glucose-Capillary: 163 mg/dL — ABNORMAL HIGH (ref 70–99)

## 2020-09-12 LAB — POCT I-STAT 7, (LYTES, BLD GAS, ICA,H+H)
Acid-base deficit: 7 mmol/L — ABNORMAL HIGH (ref 0.0–2.0)
Bicarbonate: 17.8 mmol/L — ABNORMAL LOW (ref 20.0–28.0)
Calcium, Ion: 1.23 mmol/L (ref 1.15–1.40)
HCT: 39 % (ref 39.0–52.0)
Hemoglobin: 13.3 g/dL (ref 13.0–17.0)
O2 Saturation: 98 %
Patient temperature: 97.8
Potassium: 4.4 mmol/L (ref 3.5–5.1)
Sodium: 139 mmol/L (ref 135–145)
TCO2: 19 mmol/L — ABNORMAL LOW (ref 22–32)
pCO2 arterial: 32.4 mmHg (ref 32.0–48.0)
pH, Arterial: 7.345 — ABNORMAL LOW (ref 7.350–7.450)
pO2, Arterial: 113 mmHg — ABNORMAL HIGH (ref 83.0–108.0)

## 2020-09-12 LAB — MRSA NEXT GEN BY PCR, NASAL: MRSA by PCR Next Gen: NOT DETECTED

## 2020-09-12 IMAGING — DX DG CHEST 1V PORT
1 series · 1 of 1 positions shown · non-contrast
Comparison: [DATE]

CLINICAL DATA: Code stroke.

EXAM:
PORTABLE CHEST 1 VIEW

[chest]
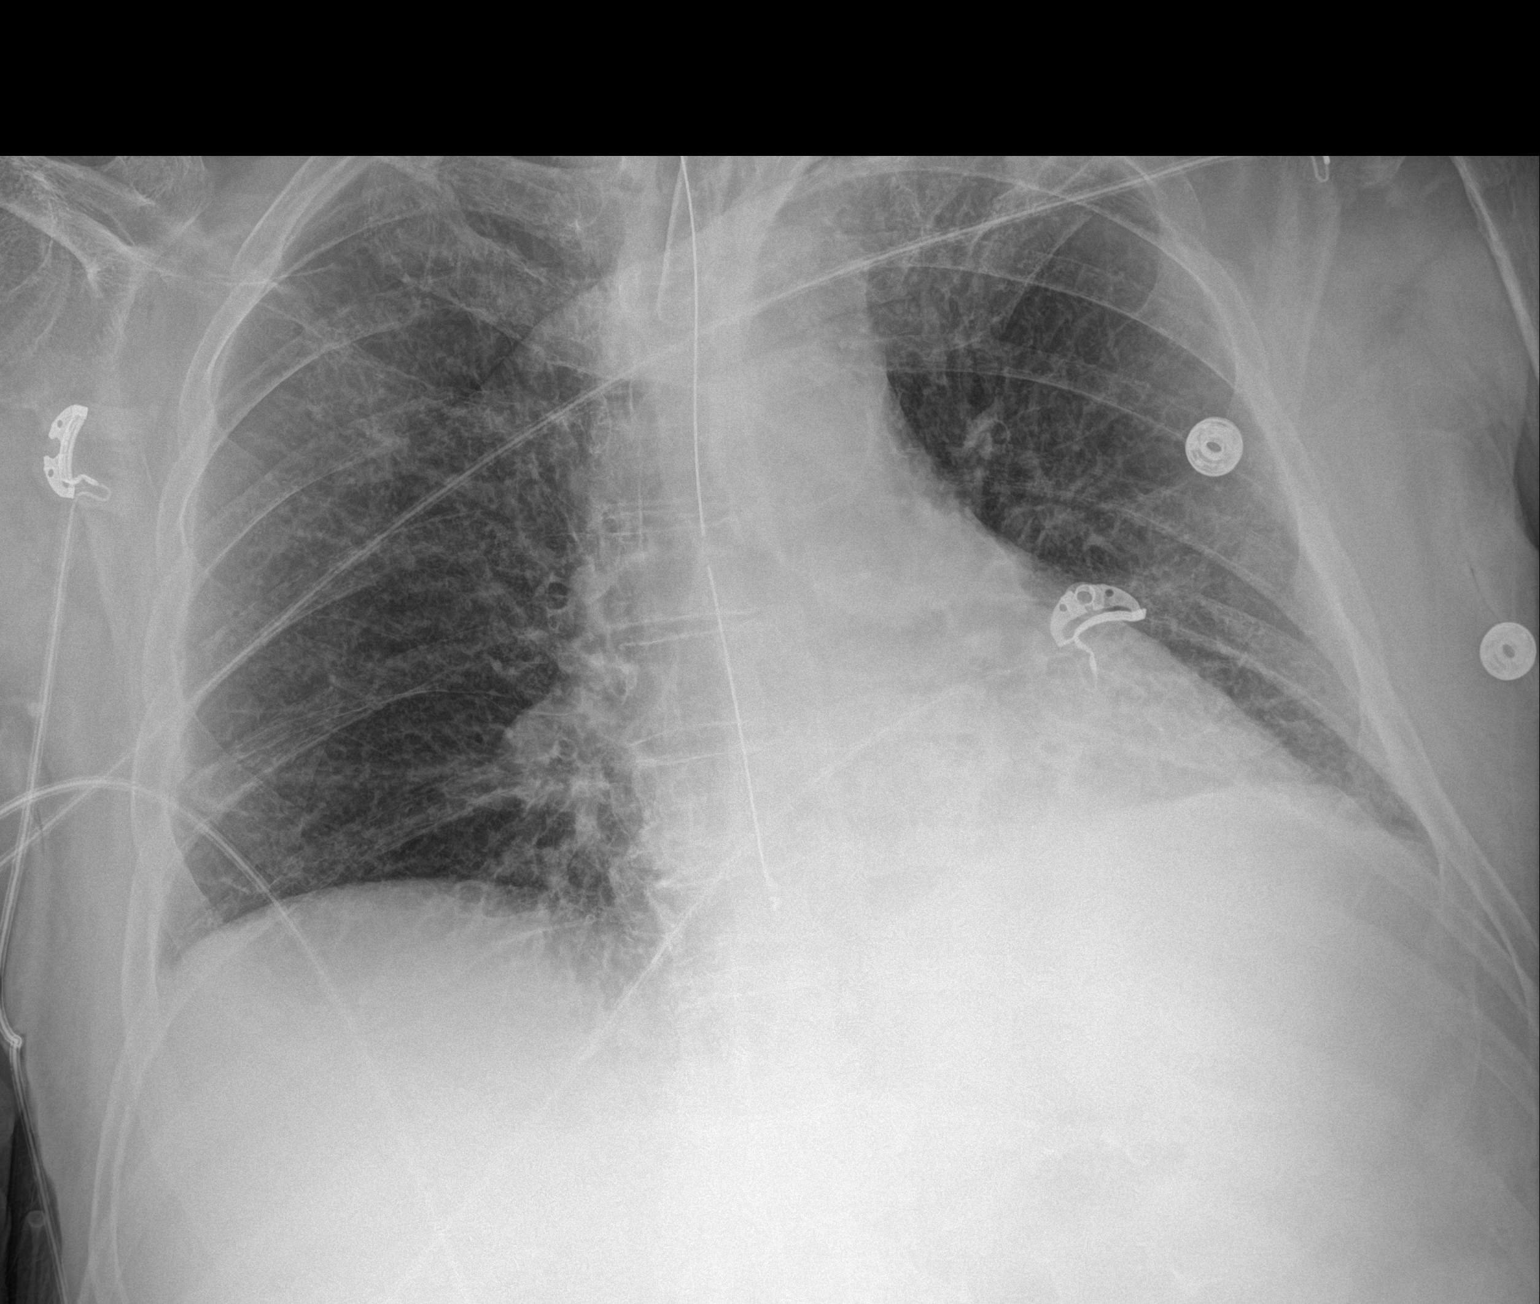

[1 of 1 positions shown; findings below may reference images not displayed]

FINDINGS: [ZO] hours. Endotracheal tube tip is 5.4 cm above the base of the
carina. NG tube tip is in the distal esophagus with proximal side
port in the mid esophagus. This tube could be advanced approximately
12-13 cm to place the proximal side port below the GE junction.
Interstitial markings are diffusely coarsened with chronic features.
Interval decrease in retrocardiac left base atelectasis. Bones are
diffusely demineralized. Telemetry leads overlie the chest.
IMPRESSION: 1. Endotracheal tube tip is 5.4 cm above the base of the carina.
2. NG tube tip is in the distal esophagus, see above.
3. Persistent chronic interstitial coarsening with interval
improvement in retrocardiac left base atelectasis.

## 2020-09-12 IMAGING — MR MR HEAD W/O CM
12 of 13 series · 44 of 48 positions shown · non-contrast
Comparison: Head CT [DATE]. Head and neck CTA and CTP
[DATE].

CLINICAL DATA: Neuro deficit, acute, stroke suspected; Stroke,
follow up. Status post right MCA thrombectomy.

EXAM:
MRI HEAD WITHOUT CONTRAST
MRA HEAD WITHOUT CONTRAST
TECHNIQUE: Multiplanar, multi-echo pulse sequences of the brain and surrounding
structures were acquired without intravenous contrast. Angiographic
images of the Circle of Willis were acquired using MRA technique
without intravenous contrast.

[Series 5: DWI · axial · 3.0mm · 0.88mm/px · z∈[-58,+84]mm · 8 of 100 slices shown (1 of 4)]
[im 1/100]
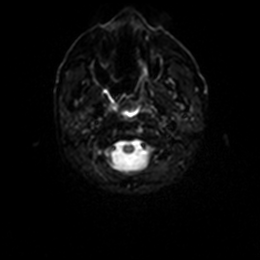
[im 15/100]
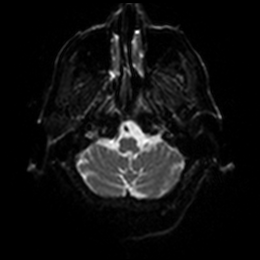
[im 29/100]
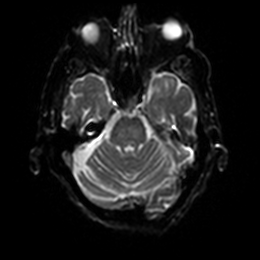
[im 43/100]
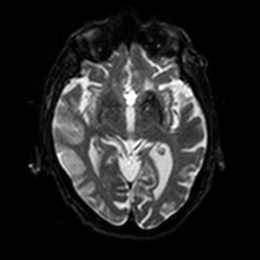
[im 57/100]
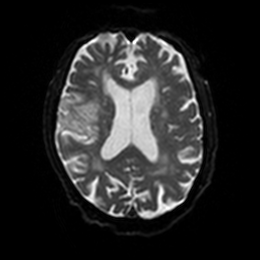
[im 71/100]
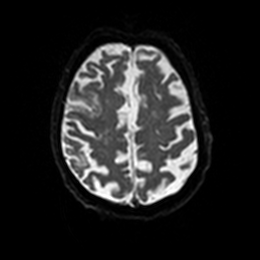
[im 85/100]
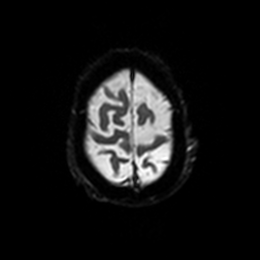
[im 100/100]
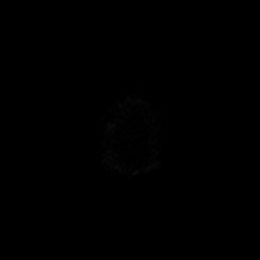

[Series 6: DWI · axial · 3.0mm · 0.88mm/px · z∈[-58,+84]mm · 4 of 50 slices shown (2 of 4)]
[im 1/50]
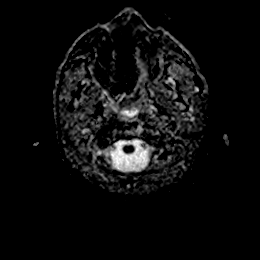
[im 17/50]
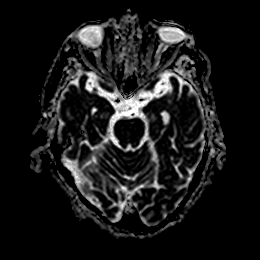
[im 33/50]
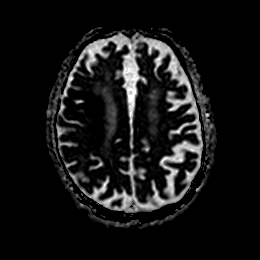
[im 50/50]
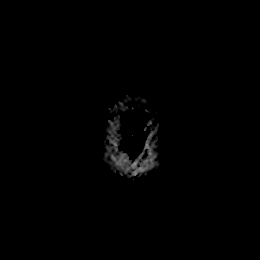

[Series 7: DWI · coronal · 4.0mm · 0.88mm/px · 5 of 68 slices shown (3 of 4)]
[im 1/68]
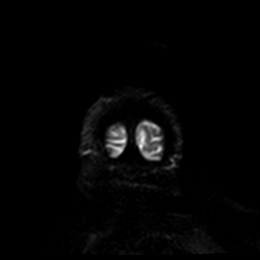
[im 17/68]
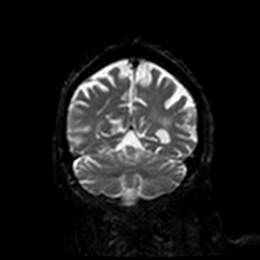
[im 34/68]
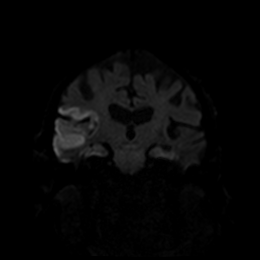
[im 51/68]
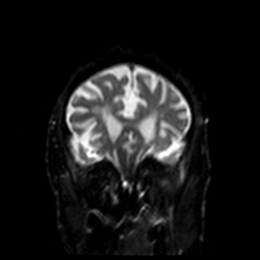
[im 68/68]
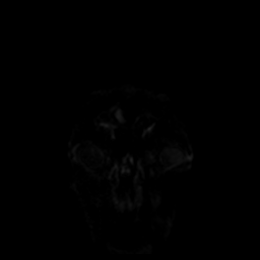

[Series 8: DWI · coronal · 4.0mm · 0.88mm/px · 3 of 34 slices shown (4 of 4)]
[im 1/34]
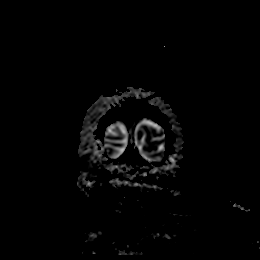
[im 17/34]
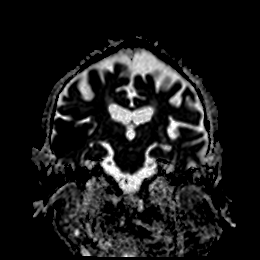
[im 34/34]
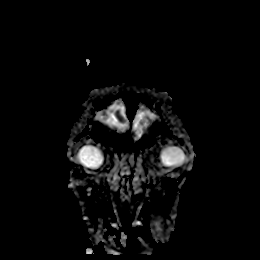

[Series 9: T1 · sagittal · 5.0mm · 0.75mm/px · 2 of 23 slices shown]
[im 1/23]
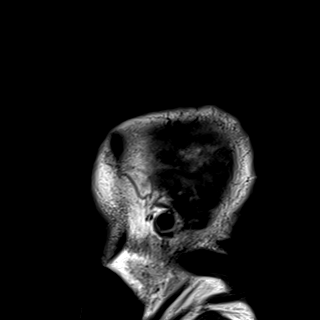
[im 23/23]
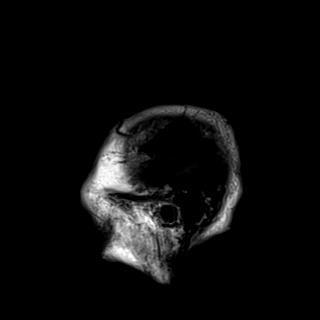

[Series 10: T2 · axial · 5.0mm · 0.72mm/px · z∈[-61,+89]mm · 2 of 27 slices shown (1 of 2)]
[im 1/27]
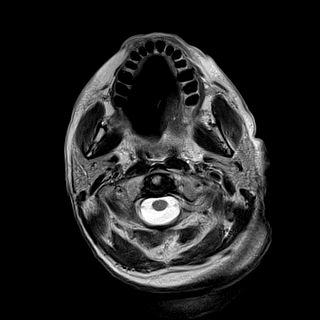
[im 27/27]
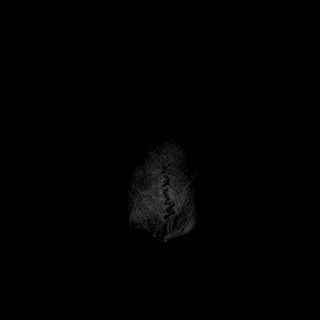

[Series 11: FLAIR · axial · 5.0mm · 0.45mm/px · z∈[-62,+88]mm · 2 of 27 slices shown]
[im 1/27]
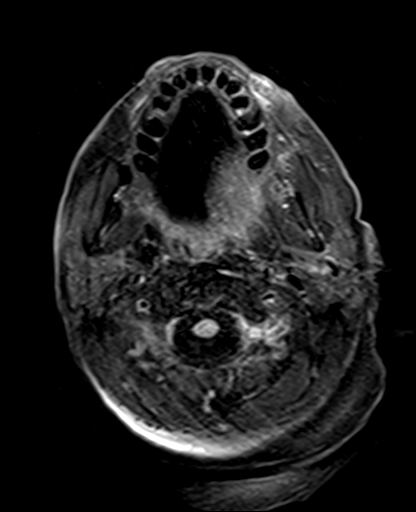
[im 27/27]
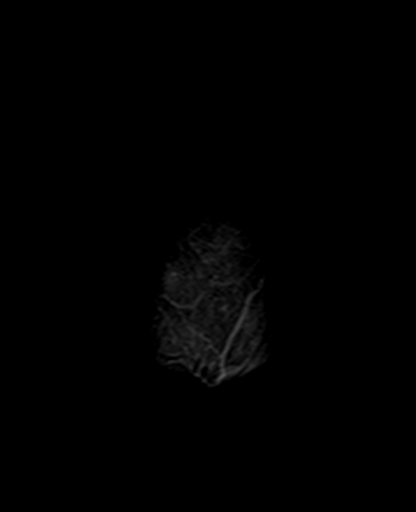

[Series 12: mag_images · axial · 3.0mm · 0.90mm/px · z∈[-74,+97]mm · 4 of 60 slices shown]
[im 1/60]
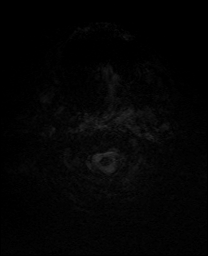
[im 20/60]
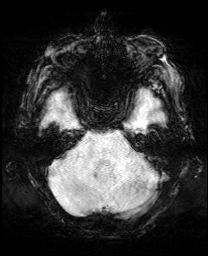
[im 40/60]
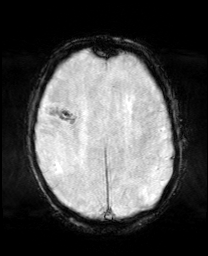
[im 60/60]
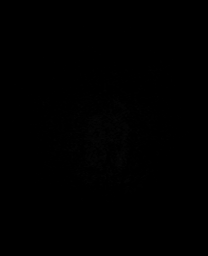

[Series 13: pha_images · axial · 3.0mm · 0.90mm/px · z∈[-74,+97]mm · 4 of 58 slices shown]
[im 1/58]
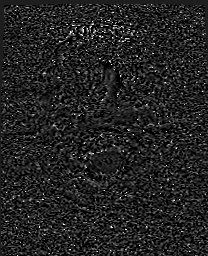
[im 20/58]
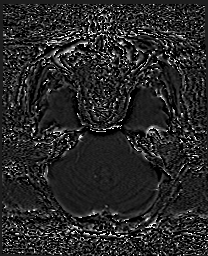
[im 39/58]
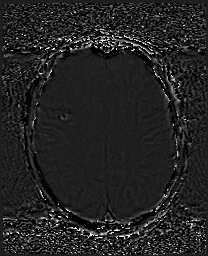
[im 58/58]
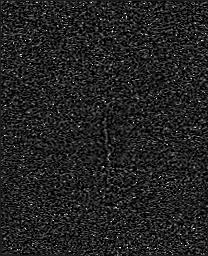

[Series 14: swi_images · axial · 3.0mm · 0.90mm/px · z∈[-74,+97]mm · 4 of 60 slices shown]
[im 1/60]
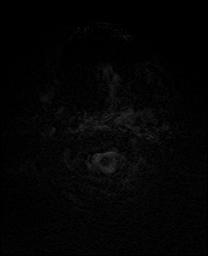
[im 20/60]
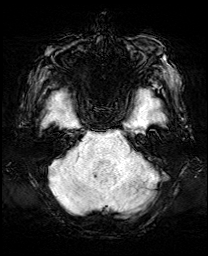
[im 40/60]
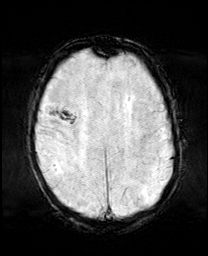
[im 60/60]
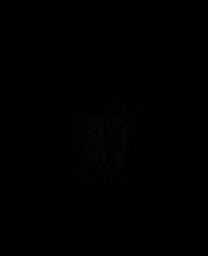

[Series 15: mip_images(sw) · axial · 24.0mm · 0.90mm/px · z∈[-64,+87]mm · 4 of 53 slices shown]
[im 1/53]
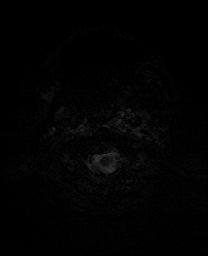
[im 18/53]
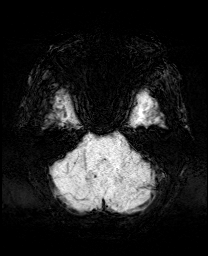
[im 35/53]
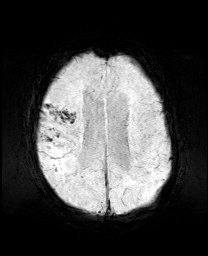
[im 53/53]
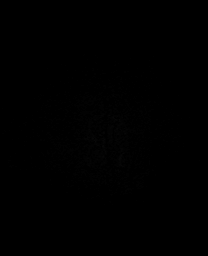

[Series 17: T2 · coronal · 5.0mm · 0.34mm/px · 2 of 29 slices shown (2 of 2)]
[im 1/29]
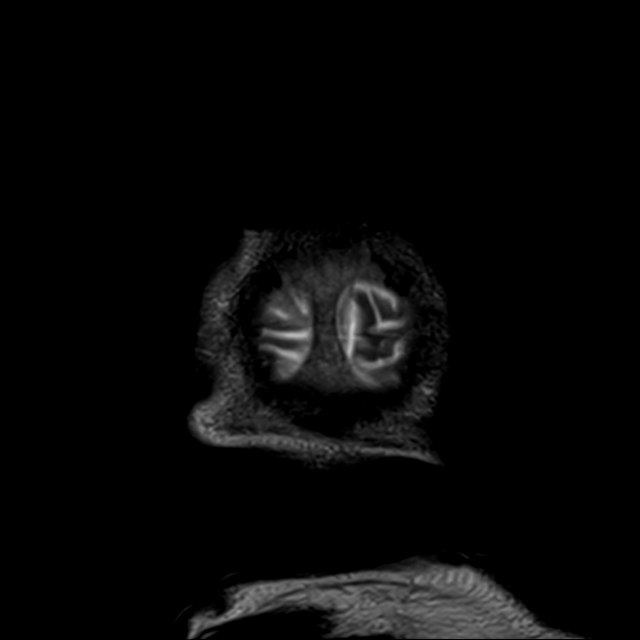
[im 29/29]
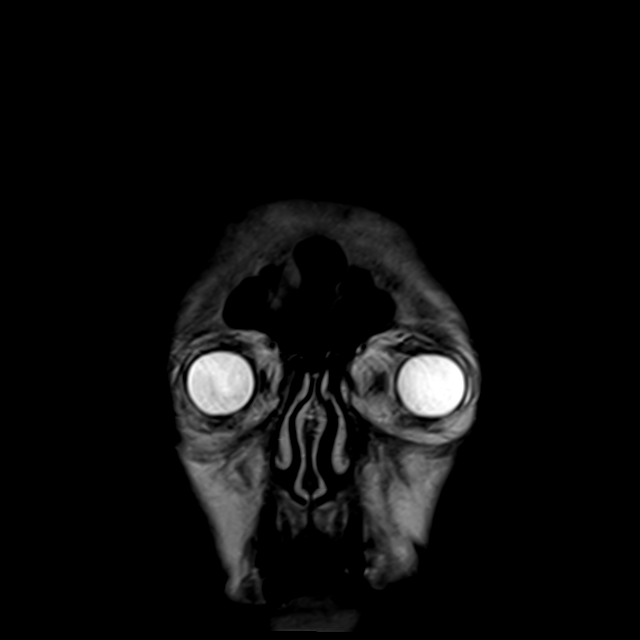

[44 of 48 positions shown; findings below may reference images not displayed]

FINDINGS: MRI HEAD FINDINGS

Brain: There is a moderately large acute right MCA territory infarct
involving much of the lateral temporal lobe, insula, and small
portions of the frontal and parietal lobes including the operculum.
There is associated petechial hemorrhage. There is mild cytotoxic
edema without significant mass effect.

T2 hyperintensities elsewhere in the cerebral white matter
bilaterally are nonspecific but compatible with moderate chronic
small vessel ischemic disease. There are small chronic infarcts in
the left basal ganglia, left thalamus, and right greater than left
cerebellum. There are chronic blood products associated with the
left basal ganglia infarct, and there are also a few scattered
chronic microhemorrhages in the cerebrum and as well as one in the
cerebellum. Mild chronic small vessel changes are noted in the pons.
There is mild to moderate cerebral atrophy. No mass, midline shift,
or extra-axial fluid collection is identified.

Vascular: Arteries more fully evaluated below. Suspected slow flow
in the right transverse and sigmoid sinuses.

Skull and upper cervical spine: Unremarkable bone marrow signal.

Sinuses/Orbits: Bilateral cataract extraction. Paranasal sinuses and
mastoid air cells are clear.

Other: None.

MRA HEAD FINDINGS

The study is moderately motion degraded.

Anterior circulation: The internal carotid arteries are patent from
skull base to carotid termini with detailed assessment of bilateral
paraclinoid stenoses limited by motion. The ACAs are patent with a
mild-to-moderate a left A1 stenosis again noted. The M1 segments are
patent with motion artifact limiting detailed assessment of the
distal right M1 segment and of bilateral MCA branch vessels, however
the revascularized right M2 superior division remains grossly
patent. No aneurysm is identified.

Posterior circulation: The visualized distal vertebral arteries are
patent to the basilar with the left being mildly dominant. The
basilar artery is patent with a mild stenosis in its midportion,
less severe than the appearance on yesterday's CTA. The PCAs are
patent with a severe stenosis again noted near the left P1-P2
junction. There is only evidence of mild narrowing near the right
P1-P2 junction in contrast to the appearance severe stenosis on
yesterday's CTA. No aneurysm is identified.

Anatomic variants: None.
IMPRESSION: 1. Moderately large acute right MCA territory infarct with petechial
hemorrhage.
2. Moderate chronic small vessel ischemic disease with chronic
lacunar infarcts as above.
3. Motion degraded head MRA as detailed above. The revascularized
right M2 division remains grossly patent.

## 2020-09-12 IMAGING — CT CT HEAD W/O CM
2 series · 15 of 30 positions shown, 17 images · non-contrast
Comparison: None.

CLINICAL DATA: Status post thrombectomy

EXAM:
CT HEAD WITHOUT CONTRAST
TECHNIQUE: Contiguous axial images were obtained from the base of the skull
through the vertex without intravenous contrast.

[Series 3: head wo · axial · 0.40mm/px · z∈[-150,-30]mm · 7 of 34 slices shown, 9 images]
[im 5/34  brain]
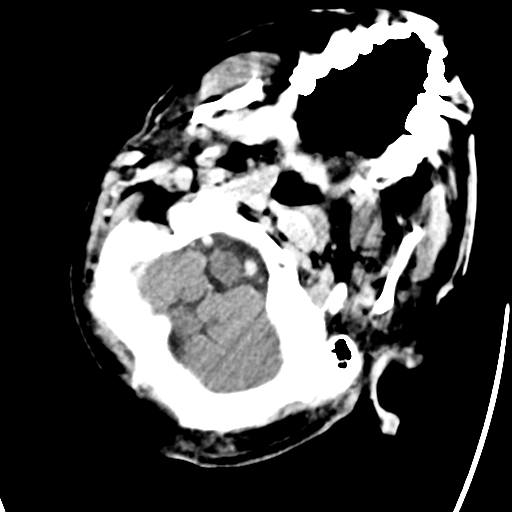
[im 5/34  bone]
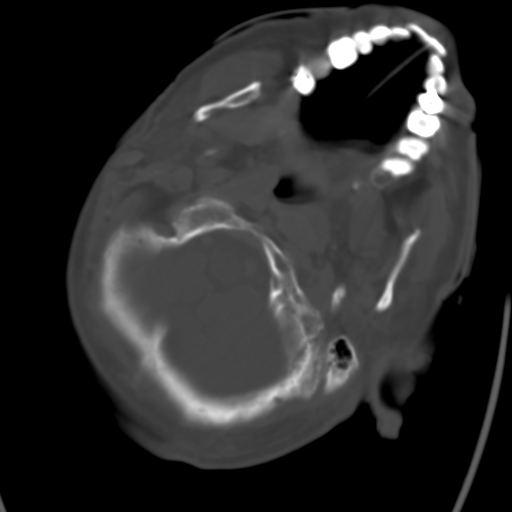
[im 9/34  brain]
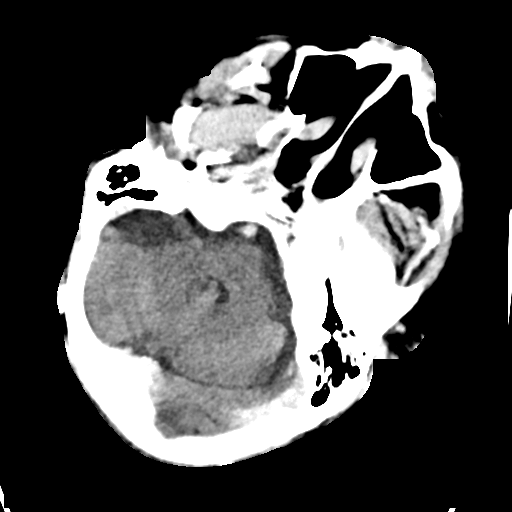
[im 13/34  brain]
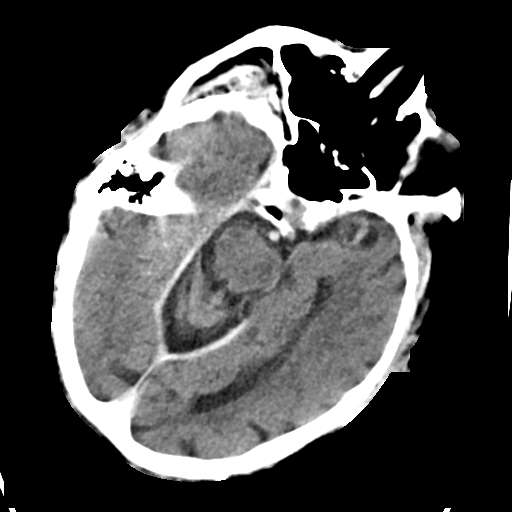
[im 17/34  brain]
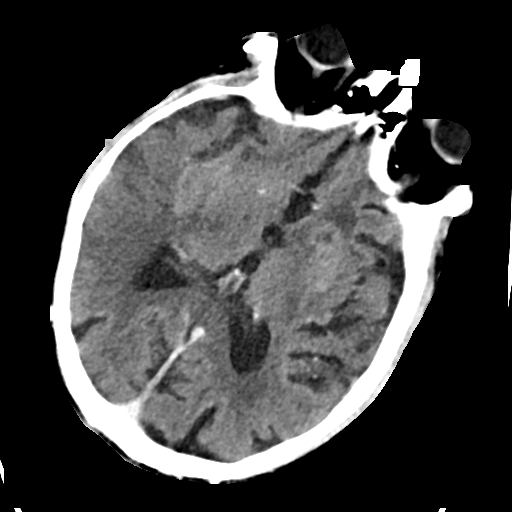
[im 21/34  brain]
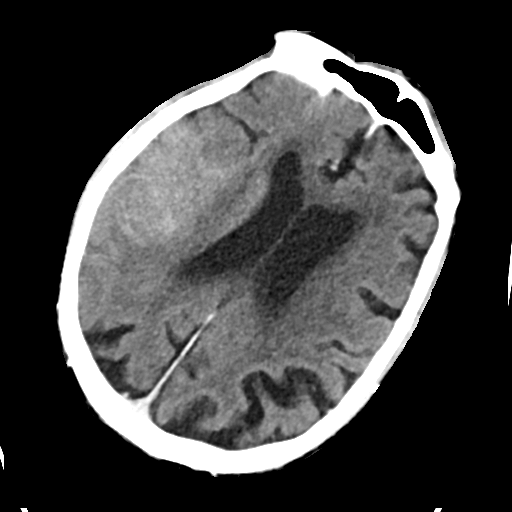
[im 21/34  bone]
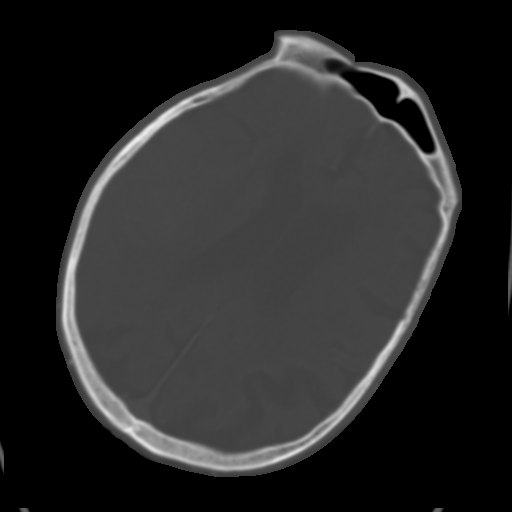
[im 25/34  brain]
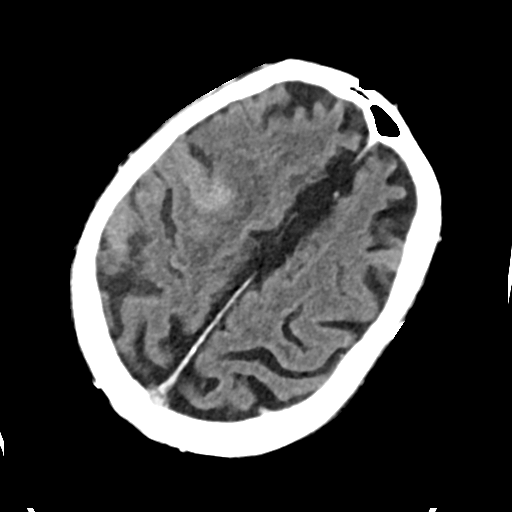
[im 29/34  brain]
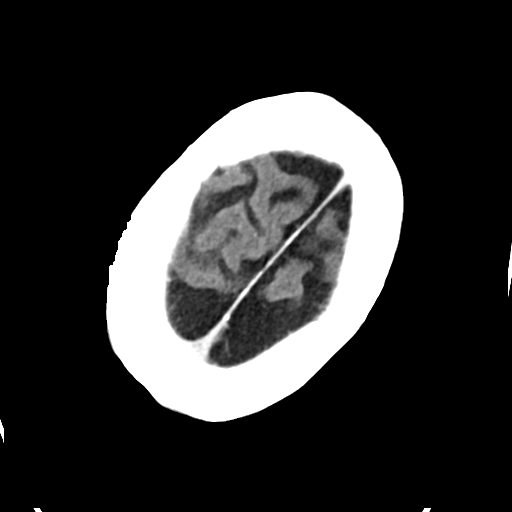

[Series 4: head bone · axial · 0.40mm/px · z∈[-154,-20]mm · 8 of 85 slices shown]
[im 9/85  bone]
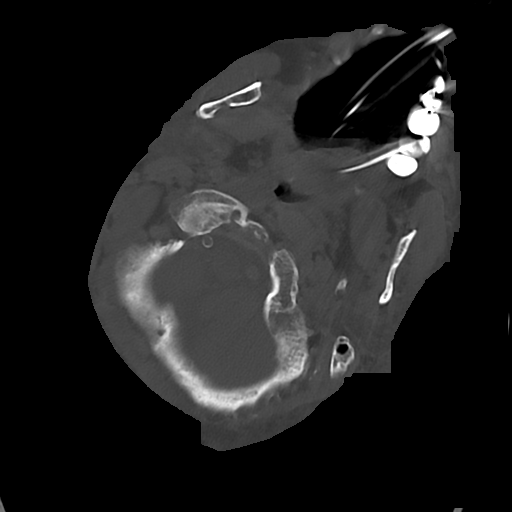
[im 17/85  bone]
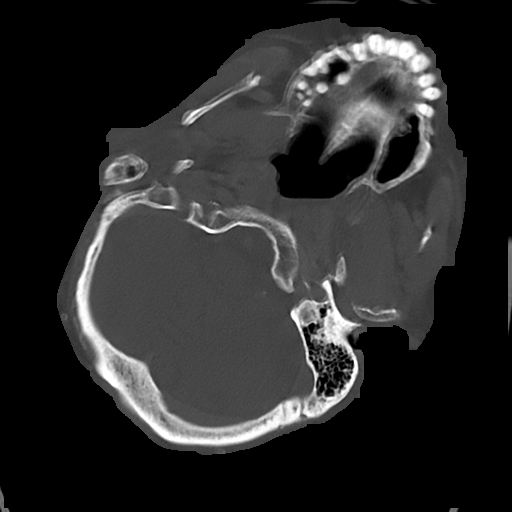
[im 26/85  bone]
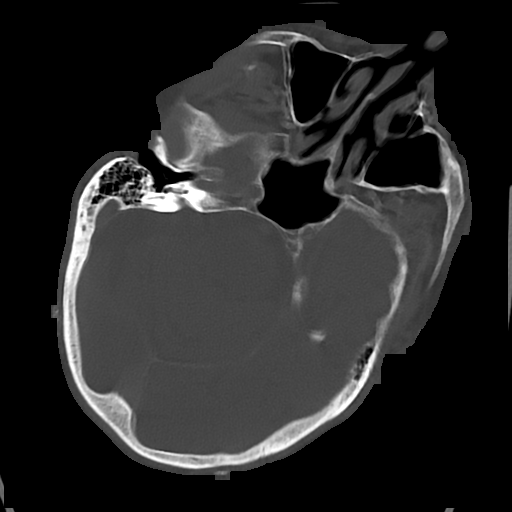
[im 38/85  bone]
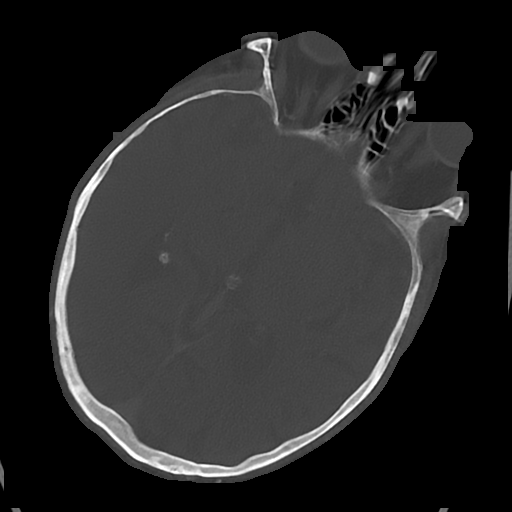
[im 47/85  bone]
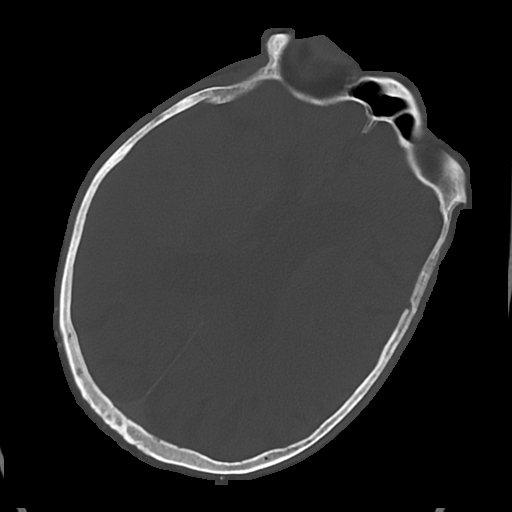
[im 59/85  bone]
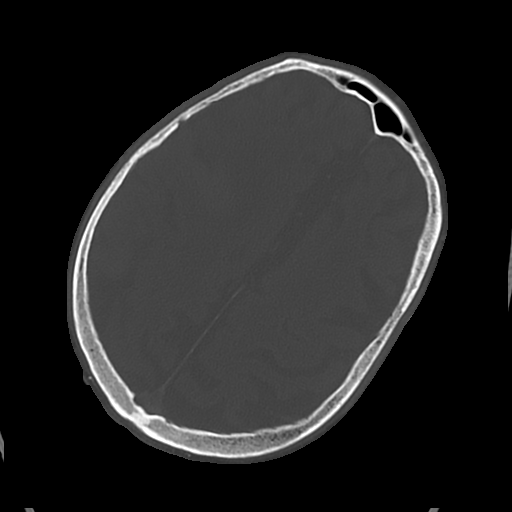
[im 68/85  bone]
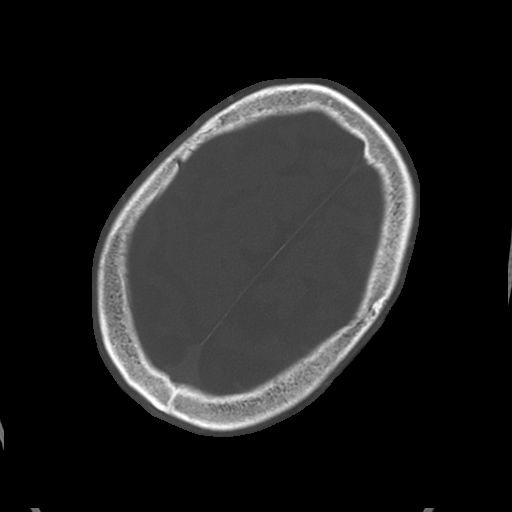
[im 76/85  bone]
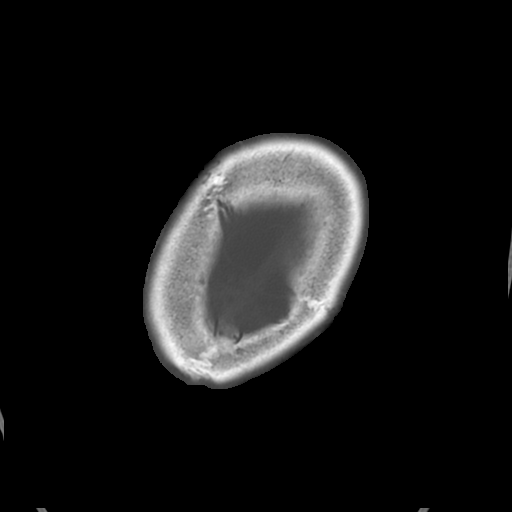

[15 of 30 positions shown; findings below may reference images not displayed]

FINDINGS: Brain: There is hyperdensity over the right MCA territory, likely
contrast staining. There is generalized atrophy without lobar
predilection. There is periventricular hypoattenuation compatible
with chronic microvascular disease.

Vascular: No abnormal hyperdensity of the major intracranial
arteries or dural venous sinuses. No intracranial atherosclerosis.

Skull: The visualized skull base, calvarium and extracranial soft
tissues are normal.

Sinuses/Orbits: No fluid levels or advanced mucosal thickening of
the visualized paranasal sinuses. No mastoid or middle ear effusion.
The orbits are normal.
IMPRESSION: Hyperdensity over the right MCA territory, likely contrast staining.

## 2020-09-12 IMAGING — MR MR MRA HEAD W/O CM
1 series · 18 of 48 positions shown · non-contrast
Comparison: Head CT [DATE]. Head and neck CTA and CTP
[DATE].

CLINICAL DATA: Neuro deficit, acute, stroke suspected; Stroke,
follow up. Status post right MCA thrombectomy.

EXAM:
MRI HEAD WITHOUT CONTRAST
MRA HEAD WITHOUT CONTRAST
TECHNIQUE: Multiplanar, multi-echo pulse sequences of the brain and surrounding
structures were acquired without intravenous contrast. Angiographic
images of the Circle of Willis were acquired using MRA technique
without intravenous contrast.

[Series 5: 3d cow · axial · 0.5mm · 0.41mm/px · z∈[-60,+18]mm · 18 of 172 slices shown]
[im 1/172]
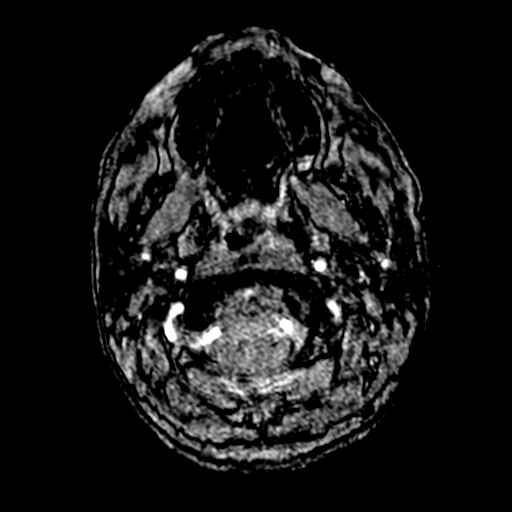
[im 4/172]
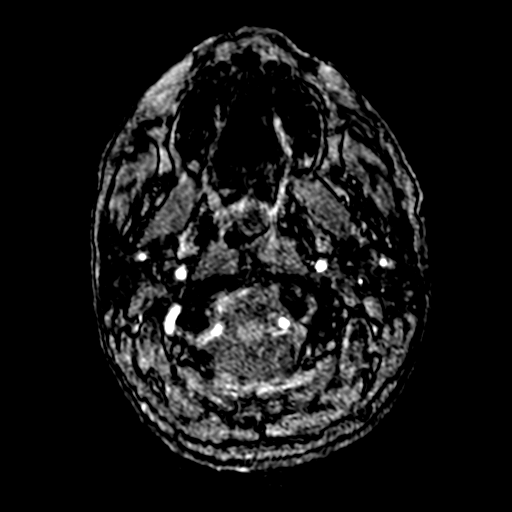
[im 8/172]
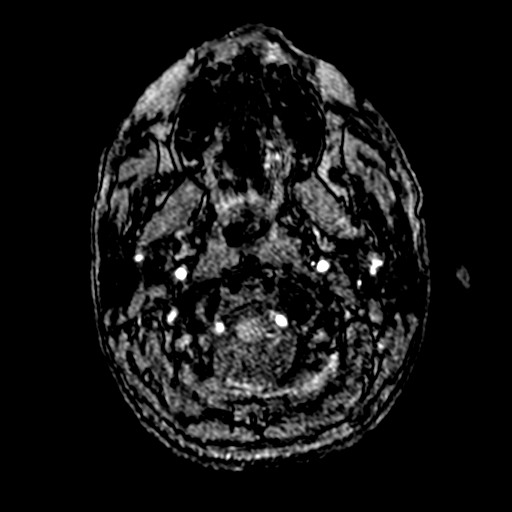
[im 11/172]
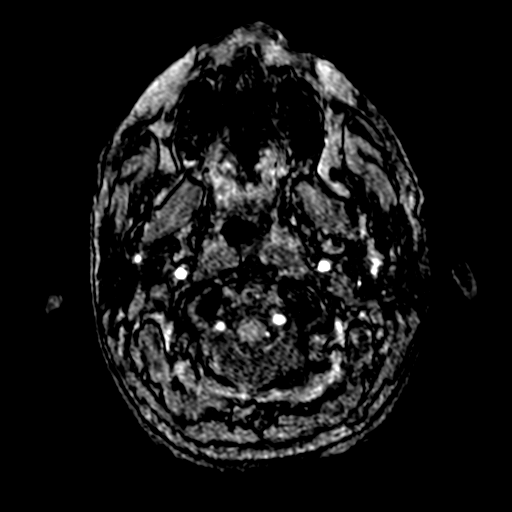
[im 15/172]
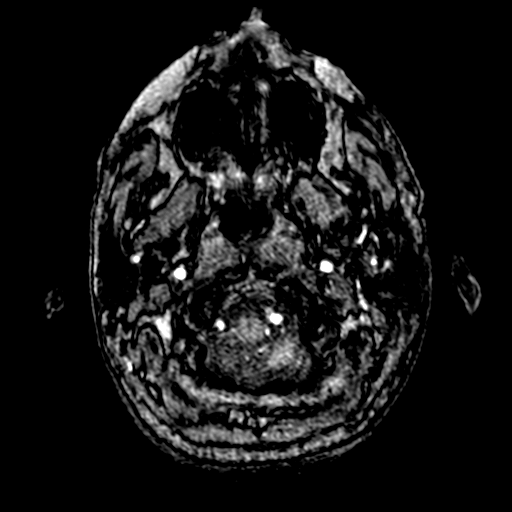
[im 19/172]
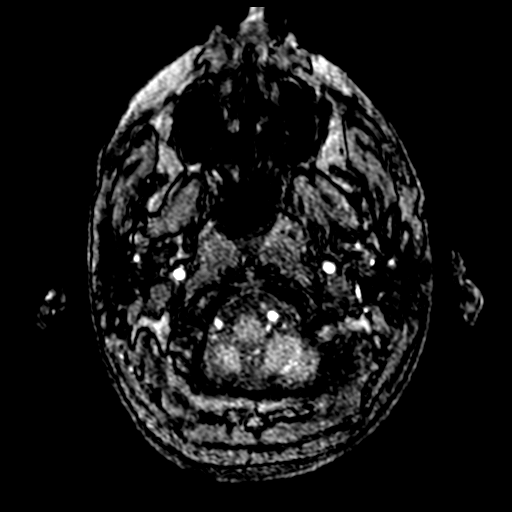
[im 22/172]
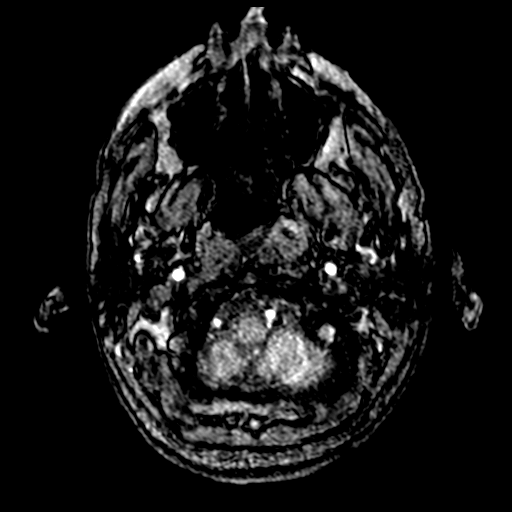
[im 26/172]
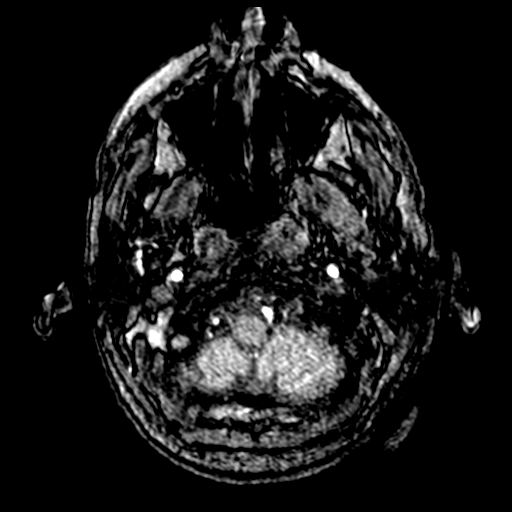
[im 30/172]
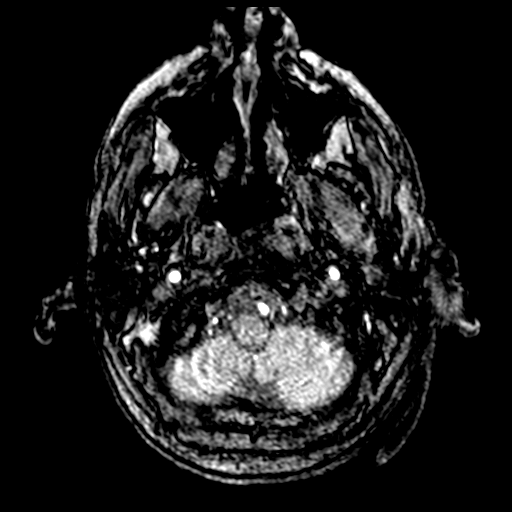
[im 33/172]
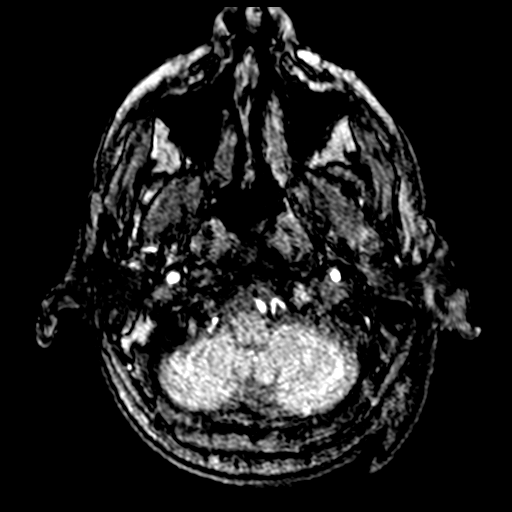
[im 55/172]
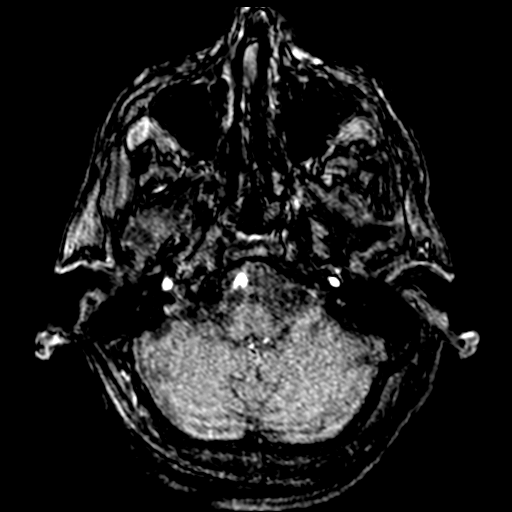
[im 77/172]
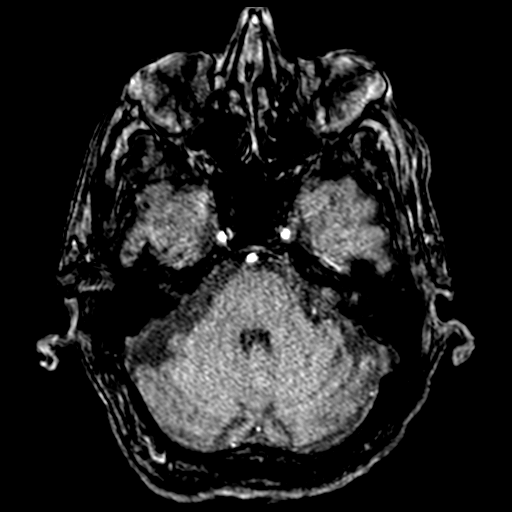
[im 88/172]
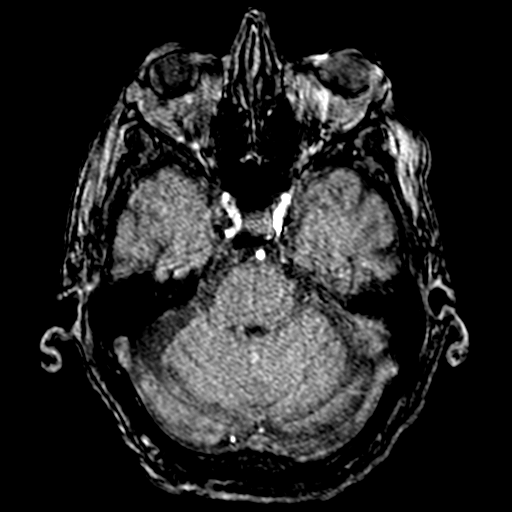
[im 99/172]
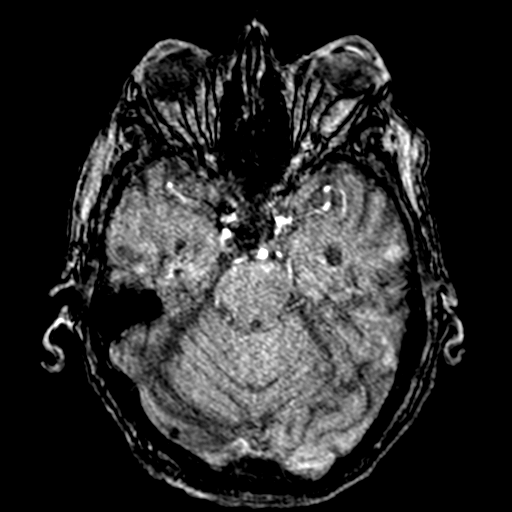
[im 121/172]
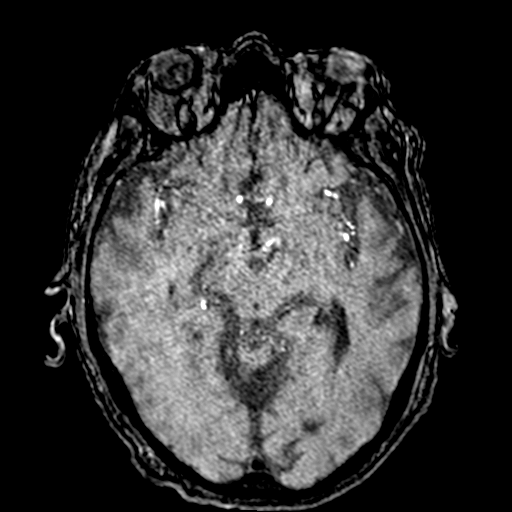
[im 142/172]
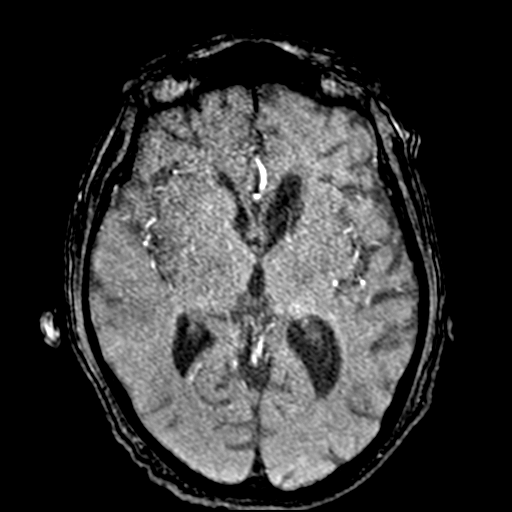
[im 146/172]
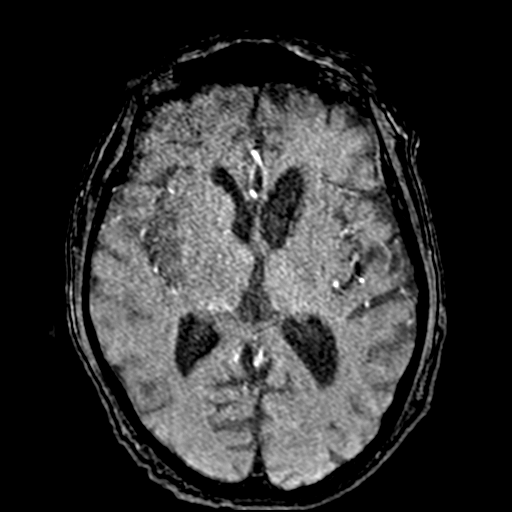
[im 164/172]
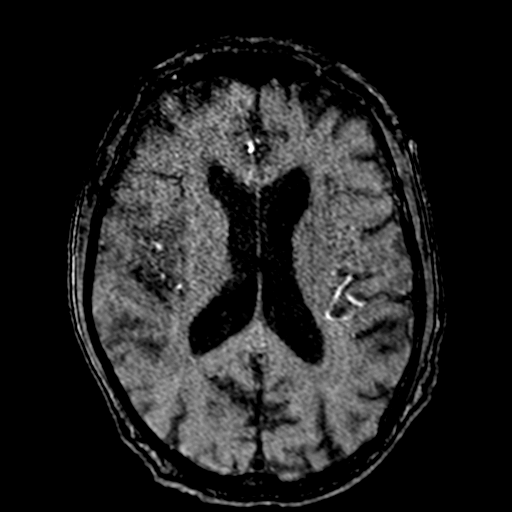

[18 of 48 positions shown; findings below may reference images not displayed]

FINDINGS: MRI HEAD FINDINGS

Brain: There is a moderately large acute right MCA territory infarct
involving much of the lateral temporal lobe, insula, and small
portions of the frontal and parietal lobes including the operculum.
There is associated petechial hemorrhage. There is mild cytotoxic
edema without significant mass effect.

T2 hyperintensities elsewhere in the cerebral white matter
bilaterally are nonspecific but compatible with moderate chronic
small vessel ischemic disease. There are small chronic infarcts in
the left basal ganglia, left thalamus, and right greater than left
cerebellum. There are chronic blood products associated with the
left basal ganglia infarct, and there are also a few scattered
chronic microhemorrhages in the cerebrum and as well as one in the
cerebellum. Mild chronic small vessel changes are noted in the pons.
There is mild to moderate cerebral atrophy. No mass, midline shift,
or extra-axial fluid collection is identified.

Vascular: Arteries more fully evaluated below. Suspected slow flow
in the right transverse and sigmoid sinuses.

Skull and upper cervical spine: Unremarkable bone marrow signal.

Sinuses/Orbits: Bilateral cataract extraction. Paranasal sinuses and
mastoid air cells are clear.

Other: None.

MRA HEAD FINDINGS

The study is moderately motion degraded.

Anterior circulation: The internal carotid arteries are patent from
skull base to carotid termini with detailed assessment of bilateral
paraclinoid stenoses limited by motion. The ACAs are patent with a
mild-to-moderate a left A1 stenosis again noted. The M1 segments are
patent with motion artifact limiting detailed assessment of the
distal right M1 segment and of bilateral MCA branch vessels, however
the revascularized right M2 superior division remains grossly
patent. No aneurysm is identified.

Posterior circulation: The visualized distal vertebral arteries are
patent to the basilar with the left being mildly dominant. The
basilar artery is patent with a mild stenosis in its midportion,
less severe than the appearance on yesterday's CTA. The PCAs are
patent with a severe stenosis again noted near the left P1-P2
junction. There is only evidence of mild narrowing near the right
P1-P2 junction in contrast to the appearance severe stenosis on
yesterday's CTA. No aneurysm is identified.

Anatomic variants: None.
IMPRESSION: 1. Moderately large acute right MCA territory infarct with petechial
hemorrhage.
2. Moderate chronic small vessel ischemic disease with chronic
lacunar infarcts as above.
3. Motion degraded head MRA as detailed above. The revascularized
right M2 division remains grossly patent.

## 2020-09-12 IMAGING — DX DG ABD PORTABLE 1V
1 series · 1 of 1 positions shown · non-contrast
Comparison: [DATE]

CLINICAL DATA: Enteric tube placement

EXAM:
PORTABLE ABDOMEN - 1 VIEW

[abdomen]
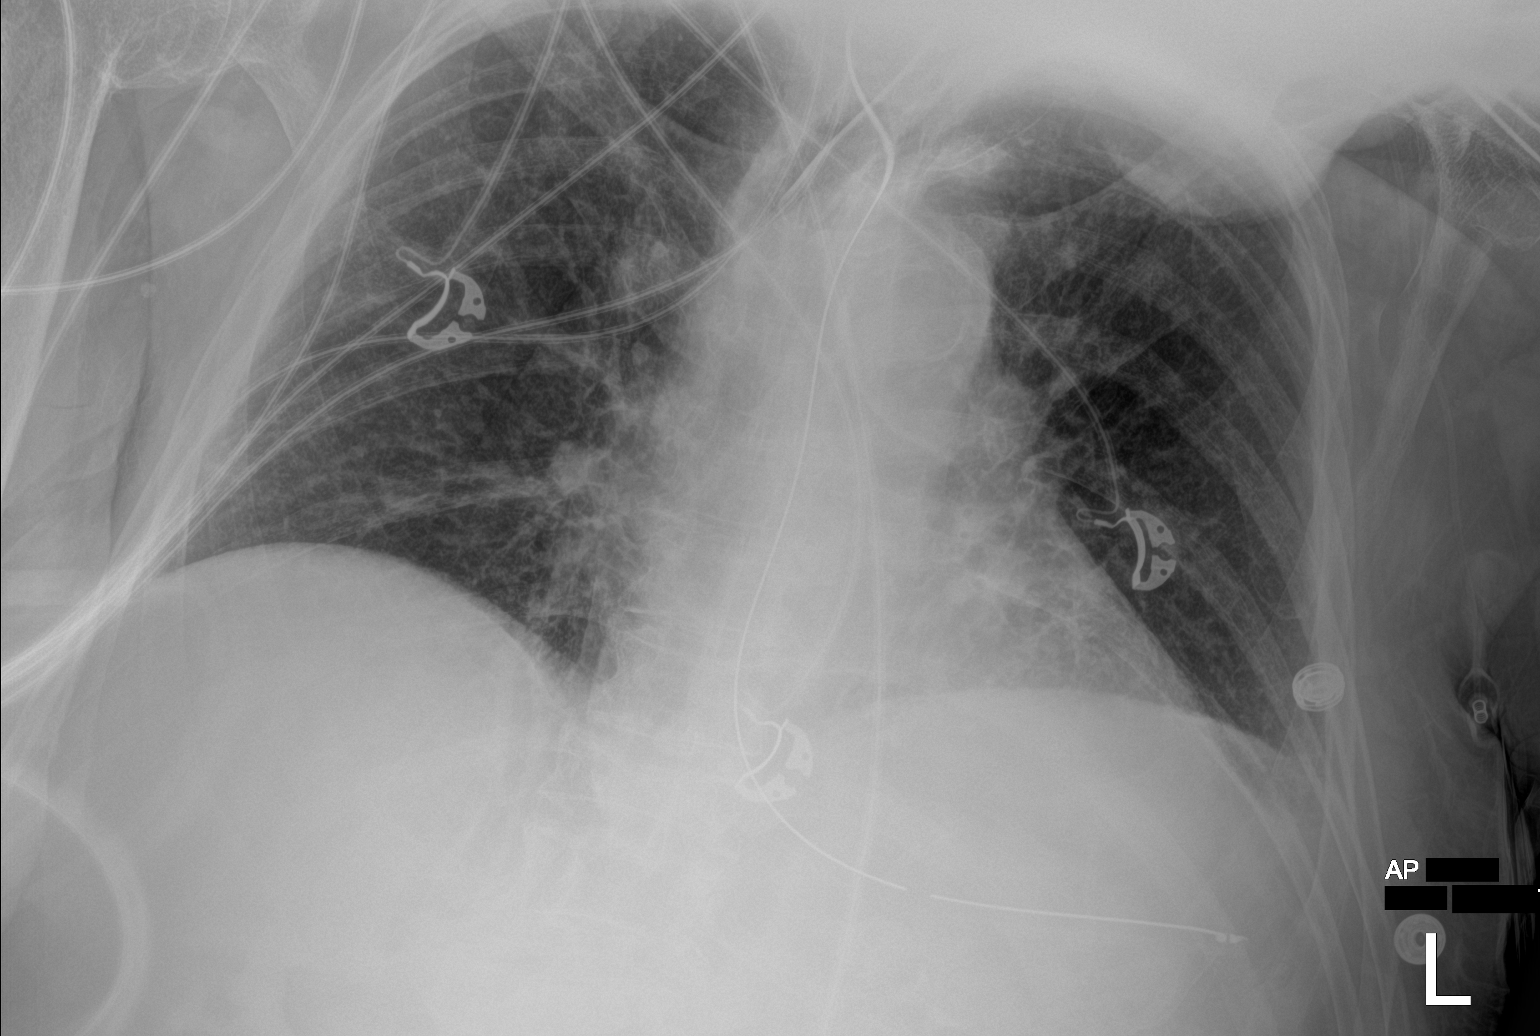

[1 of 1 positions shown; findings below may reference images not displayed]

FINDINGS: Incomplete assessment of majority of the abdomen. Enteric tube tip
and side port project over the stomach. Scattered linear opacities,
likely atelectasis. Coarse reticular opacities unchanged.
Atherosclerotic calcifications of the aorta. Unchanged
cardiomediastinal silhouette. ETT tip terminates 2.6 cm above the
carina. Chronic compression fracture deformity at the thoracolumbar
junction.
IMPRESSION: Enteric tube tip and side port project over the stomach.

## 2020-09-12 MED ORDER — LACTATED RINGERS IV BOLUS
500.0000 mL | Freq: Once | INTRAVENOUS | Status: AC
  Administered 2020-09-12: 500 mL via INTRAVENOUS

## 2020-09-12 MED ORDER — DOXAZOSIN MESYLATE 4 MG PO TABS: 4.0000 mg | ORAL_TABLET | Freq: Every day | ORAL | Status: DC

## 2020-09-12 MED ORDER — ORAL CARE MOUTH RINSE
15.0000 mL | OROMUCOSAL | Status: DC
  Administered 2020-09-12 – 2020-09-17 (×55): 15 mL via OROMUCOSAL

## 2020-09-12 MED ORDER — LEVETIRACETAM IN NACL 1500 MG/100ML IV SOLN
1500.0000 mg | Freq: Once | INTRAVENOUS | Status: AC
  Administered 2020-09-12: 1500 mg via INTRAVENOUS
  Filled 2020-09-12: qty 100

## 2020-09-12 MED ORDER — LACTATED RINGERS IV SOLN: INTRAVENOUS | Status: DC

## 2020-09-12 MED ORDER — ASPIRIN 325 MG PO TABS
325.0000 mg | ORAL_TABLET | Freq: Every day | ORAL | Status: DC
  Administered 2020-09-12 – 2020-09-23 (×10): 325 mg
  Filled 2020-09-12 (×10): qty 1

## 2020-09-12 MED ORDER — ENOXAPARIN SODIUM 40 MG/0.4ML IJ SOSY
40.0000 mg | PREFILLED_SYRINGE | INTRAMUSCULAR | Status: DC
  Administered 2020-09-12: 40 mg via SUBCUTANEOUS
  Filled 2020-09-12: qty 0.4

## 2020-09-12 MED ORDER — ATORVASTATIN CALCIUM 40 MG PO TABS
40.0000 mg | ORAL_TABLET | Freq: Every day | ORAL | Status: DC
  Administered 2020-09-12 – 2020-09-23 (×11): 40 mg
  Filled 2020-09-12 (×11): qty 1

## 2020-09-12 MED ORDER — SODIUM CHLORIDE 0.9 % IV SOLN: 250.0000 mL | INTRAVENOUS | Status: DC

## 2020-09-12 MED ORDER — VITAL HIGH PROTEIN PO LIQD
1000.0000 mL | ORAL | Status: DC
  Administered 2020-09-12: 1000 mL

## 2020-09-12 MED ORDER — PHENYLEPHRINE HCL-NACL 10-0.9 MG/250ML-% IV SOLN
25.0000 ug/min | INTRAVENOUS | Status: DC
Start: 2020-09-12 — End: 2020-09-13
  Filled 2020-09-12: qty 250

## 2020-09-12 MED ORDER — DOXAZOSIN MESYLATE 2 MG PO TABS
2.0000 mg | ORAL_TABLET | Freq: Every day | ORAL | Status: DC
  Administered 2020-09-12 – 2020-09-23 (×10): 2 mg
  Filled 2020-09-12 (×12): qty 1

## 2020-09-12 MED ORDER — INSULIN GLARGINE 100 UNIT/ML ~~LOC~~ SOLN
10.0000 [IU] | Freq: Every day | SUBCUTANEOUS | Status: DC
  Administered 2020-09-12 – 2020-09-14 (×3): 10 [IU] via SUBCUTANEOUS
  Filled 2020-09-12 (×4): qty 0.1

## 2020-09-12 MED ORDER — FENTANYL CITRATE (PF) 100 MCG/2ML IJ SOLN
25.0000 ug | INTRAMUSCULAR | Status: DC | PRN
  Filled 2020-09-12: qty 2

## 2020-09-12 MED ORDER — PROPOFOL 1000 MG/100ML IV EMUL
5.0000 ug/kg/min | INTRAVENOUS | Status: DC
  Administered 2020-09-12 (×2): 5 ug/kg/min via INTRAVENOUS
  Filled 2020-09-12 (×2): qty 100

## 2020-09-12 MED ORDER — PANTOPRAZOLE SODIUM 40 MG PO PACK
40.0000 mg | PACK | Freq: Every day | ORAL | Status: DC
  Administered 2020-09-12 – 2020-09-23 (×11): 40 mg
  Filled 2020-09-12 (×11): qty 20

## 2020-09-12 MED ORDER — ALBUTEROL SULFATE (2.5 MG/3ML) 0.083% IN NEBU: 2.5000 mg | INHALATION_SOLUTION | RESPIRATORY_TRACT | Status: DC | PRN

## 2020-09-12 MED ORDER — ENOXAPARIN SODIUM 30 MG/0.3ML IJ SOSY
30.0000 mg | PREFILLED_SYRINGE | INTRAMUSCULAR | Status: DC
Start: 2020-09-13 — End: 2020-09-21
  Administered 2020-09-13 – 2020-09-20 (×8): 30 mg via SUBCUTANEOUS
  Filled 2020-09-12 (×8): qty 0.3

## 2020-09-12 MED ORDER — LACTATED RINGERS IV BOLUS: 500.0000 mL | Freq: Once | INTRAVENOUS | Status: DC

## 2020-09-12 MED ORDER — CHLORHEXIDINE GLUCONATE 0.12% ORAL RINSE (MEDLINE KIT)
15.0000 mL | Freq: Two times a day (BID) | OROMUCOSAL | Status: DC
  Administered 2020-09-12 – 2020-09-18 (×14): 15 mL via OROMUCOSAL

## 2020-09-12 MED ORDER — LEVETIRACETAM IN NACL 500 MG/100ML IV SOLN
500.0000 mg | Freq: Two times a day (BID) | INTRAVENOUS | Status: DC
  Administered 2020-09-12 – 2020-09-14 (×4): 500 mg via INTRAVENOUS
  Filled 2020-09-12 (×4): qty 100

## 2020-09-12 MED ORDER — PROSOURCE TF PO LIQD
45.0000 mL | Freq: Two times a day (BID) | ORAL | Status: DC
  Administered 2020-09-12 – 2020-09-13 (×3): 45 mL
  Filled 2020-09-12 (×4): qty 45

## 2020-09-12 MED ORDER — DEXMEDETOMIDINE HCL IN NACL 400 MCG/100ML IV SOLN
0.4000 ug/kg/h | INTRAVENOUS | Status: DC
  Administered 2020-09-12: 0.4 ug/kg/h via INTRAVENOUS
  Filled 2020-09-12: qty 100

## 2020-09-12 MED ORDER — ASPIRIN 300 MG RE SUPP
300.0000 mg | Freq: Every day | RECTAL | Status: DC
  Administered 2020-09-14 – 2020-09-15 (×2): 300 mg via RECTAL
  Filled 2020-09-12 (×2): qty 1

## 2020-09-12 MED ORDER — PROPOFOL 1000 MG/100ML IV EMUL
INTRAVENOUS | Status: AC
  Filled 2020-09-12: qty 100

## 2020-09-12 MED ORDER — INSULIN ASPART 100 UNIT/ML IJ SOLN
0.0000 [IU] | INTRAMUSCULAR | Status: DC
  Administered 2020-09-12: 2 [IU] via SUBCUTANEOUS
  Administered 2020-09-12 (×2): 3 [IU] via SUBCUTANEOUS
  Administered 2020-09-13: 5 [IU] via SUBCUTANEOUS
  Administered 2020-09-13: 3 [IU] via SUBCUTANEOUS
  Administered 2020-09-13: 2 [IU] via SUBCUTANEOUS
  Administered 2020-09-13: 3 [IU] via SUBCUTANEOUS
  Administered 2020-09-13: 2 [IU] via SUBCUTANEOUS
  Administered 2020-09-14: 5 [IU] via SUBCUTANEOUS
  Administered 2020-09-14: 2 [IU] via SUBCUTANEOUS
  Administered 2020-09-15 – 2020-09-16 (×4): 3 [IU] via SUBCUTANEOUS
  Administered 2020-09-16: 8 [IU] via SUBCUTANEOUS
  Administered 2020-09-16: 5 [IU] via SUBCUTANEOUS
  Administered 2020-09-16 – 2020-09-17 (×5): 3 [IU] via SUBCUTANEOUS
  Administered 2020-09-17: 5 [IU] via SUBCUTANEOUS
  Administered 2020-09-17: 3 [IU] via SUBCUTANEOUS
  Administered 2020-09-18: 2 [IU] via SUBCUTANEOUS
  Administered 2020-09-18: 3 [IU] via SUBCUTANEOUS
  Administered 2020-09-18: 5 [IU] via SUBCUTANEOUS
  Administered 2020-09-18: 3 [IU] via SUBCUTANEOUS
  Administered 2020-09-19: 5 [IU] via SUBCUTANEOUS
  Administered 2020-09-19 (×2): 3 [IU] via SUBCUTANEOUS
  Administered 2020-09-19: 5 [IU] via SUBCUTANEOUS
  Administered 2020-09-19 – 2020-09-20 (×2): 3 [IU] via SUBCUTANEOUS
  Administered 2020-09-20: 2 [IU] via SUBCUTANEOUS
  Administered 2020-09-20: 5 [IU] via SUBCUTANEOUS
  Administered 2020-09-20 – 2020-09-21 (×5): 3 [IU] via SUBCUTANEOUS
  Administered 2020-09-21: 8 [IU] via SUBCUTANEOUS
  Administered 2020-09-21 (×2): 3 [IU] via SUBCUTANEOUS
  Administered 2020-09-22: 2 [IU] via SUBCUTANEOUS
  Administered 2020-09-22: 3 [IU] via SUBCUTANEOUS
  Administered 2020-09-22: 5 [IU] via SUBCUTANEOUS
  Administered 2020-09-22: 3 [IU] via SUBCUTANEOUS
  Administered 2020-09-22: 5 [IU] via SUBCUTANEOUS
  Administered 2020-09-23: 3 [IU] via SUBCUTANEOUS
  Administered 2020-09-23: 5 [IU] via SUBCUTANEOUS
  Administered 2020-09-23: 2 [IU] via SUBCUTANEOUS
  Administered 2020-09-23: 3 [IU] via SUBCUTANEOUS

## 2020-09-12 NOTE — Progress Notes (Signed)
SBP goals were originally <180 - changed by neurology at 0240 to SBP - 120-140

## 2020-09-12 NOTE — Progress Notes (Addendum)
Brief Neuro update:  Notified by RN abour anisocoria with proptosis with both pupils briskly reactive. Also notified of stimulus induced all extremity twitching.  Evaluated patient at bedside, R pupil is slightly larger than left with both pupils reactive to light. As for the movement, no movements noted by me when patient is at rest. With stimulus, he was noted to have a single full body twitch and with further stimulation, I noted 10 secs of twitching in R fingers, R hand and R elbow.  Recs: - Will load with Keppra - SBP goal is 120-140 - resume propofol - STAT CT Head without contrast.  Update: - Reviwed CT Head and notable for contrast staining. No other acute abnormality.  Erick Blinks [facility] Pager Number [phone]

## 2020-09-12 NOTE — Progress Notes (Signed)
  Echocardiogram 2D Echocardiogram has been performed.  Augustine Radar 09/12/2020, 3:27 PM

## 2020-09-12 NOTE — Progress Notes (Signed)
Difficulty managing BP within goals; pt has been dyssynchronous with the vent. E-link provider notified; requested pressor for BP and alternative sedation to keep patient managed. Orders received for fluid bolus and precedex.

## 2020-09-12 NOTE — Progress Notes (Signed)
STROKE TEAM PROGRESS NOTE   INTERVAL HISTORY His daughter and granddaughter as well as RN are at the bedside.  Pt eyes closed, not open with pain or voice, with forced eye opening, pt was able to raise up right hand trying to remove my hands. Right leg moves more than left. Still has low frequency right arm and leg intermittent jerking movement but not rhythmic or high amplitude. He received keppra load earlier this am. MRI and MRA pending, will do EEG after the MRI.     OBJECTIVE Vitals:   09/12/20 0400 09/12/20 0430 09/12/20 0500 09/12/20 0530  BP: 105/71     Pulse: 96 85 76 77  Resp: Temp: (!) 97.5 F (36.4 C)     TempSrc: Axillary     SpO2: 100% 100% 100% 100%  Weight:      Height:        CBC:  Recent Labs  Lab 09/12/20 0417 09/12/20 0432  WBC 11.4*  --   HGB 13.3 13.3  HCT 40.7 39.0  MCV 98.8  --   PLT 134*  --     Basic Metabolic Panel:  Recent Labs  Lab 09/11/20 1800 09/11/20 1801 09/11/20 2238 09/12/20 0432  NA 136 136 139 139  K 4.4 4.2 4.2 4.4  CL 103 105  --   --   CO2 21*  --   --   --   GLUCOSE 279* 272*  --   --   BUN 44* 44*  --   --   CREATININE 2.57* 2.50*  --   --   CALCIUM 9.1  --   --   --     Lipid Panel: No results found for: CHOL, TRIG, HDL, CHOLHDL, VLDL, LDLCALC HgbA1c: No results found for: HGBA1C Urine Drug Screen: No results found for: LABOPIA, COCAINSCRNUR, LABBENZ, AMPHETMU, THCU, LABBARB  Alcohol Level     Component Value Date/Time   ETH <10 09/11/2020 1845    IMAGING   DG Wrist Complete Left  Result Date: 09/11/2020 CLINICAL DATA:  fall EXAM: LEFT HAND - COMPLETE 3+ VIEW; LEFT WRIST - COMPLETE 3+ VIEW COMPARISON:  December 15, 2019 common March 13, 2020 FINDINGS: Revisualization of the sequela of an impacted fracture of the distal LEFT radius and ulna. There is mature osseous bridging with scattered residual areas of lucency from prior fracture sites. Fracture fragments are in unchanged alignment. No  definitive acute fracture. Vascular calcifications. Osteopenia. Degenerative changes throughout the DIPs and PIPs. IMPRESSION: Revisualization of sequela of prior impacted fracture of the distal radius and ulna. Evaluation for superimposed acute fracture is limited due to osteopenia and underlying chronic osseous remodeling. No definitive superimposed acute fracture is noted. If persistent clinical concern for scaphoid fracture, recommend immobilization and follow-up radiographs in 2 weeks versus MRI. Electronically Signed   By: Meda Klinefelter MD   On: 09/11/2020 19:17   CT HEAD WO CONTRAST  Result Date: 09/12/2020 CLINICAL DATA:  Status post thrombectomy EXAM: CT HEAD WITHOUT CONTRAST TECHNIQUE: Contiguous axial images were obtained from the base of the skull through the vertex without intravenous contrast. COMPARISON:  None. FINDINGS: Brain: There is hyperdensity over the right MCA territory, likely contrast staining. There is generalized atrophy without lobar predilection. There is periventricular hypoattenuation compatible with chronic microvascular disease. Vascular: No abnormal hyperdensity of the major intracranial arteries or dural venous sinuses. No intracranial atherosclerosis. Skull: The visualized skull base, calvarium and extracranial soft tissues are normal. Sinuses/Orbits: No fluid  levels or advanced mucosal thickening of the visualized paranasal sinuses. No mastoid or middle ear effusion. The orbits are normal. IMPRESSION: Hyperdensity over the right MCA territory, likely contrast staining. Electronically Signed   By: Deatra Robinson M.D.   On: 09/12/2020 02:55   CT Cervical Spine Wo Contrast  Result Date: 09/11/2020 CLINICAL DATA:  Facial trauma. EXAM: CT CERVICAL SPINE WITHOUT CONTRAST TECHNIQUE: Multidetector CT imaging of the cervical spine was performed without intravenous contrast. Multiplanar CT image reconstructions were also generated. COMPARISON:  None. FINDINGS: Alignment: No  evidence of acute traumatic subluxation. Skull base and vertebrae: No acute cervical spine fracture or suspicious osseous lesion. Mild superior endplate compression fracture at T3, likely chronic. Soft tissues and spinal canal: No prevertebral fluid or swelling. No visible canal hematoma. Disc levels: Moderate to severe disc space narrowing from C4-5 to C6-7. Severe bilateral facet arthrosis at C3-4 with small subchondral cysts or erosions and joint widening on the right. Right facet ankylosis at C4-5. No evidence of high-grade spinal stenosis or high-grade neural foraminal stenosis. Upper chest: No apical lung consolidation or mass. Other: Subcentimeter thyroid nodules for which no imaging follow-up is recommended. Carotid atherosclerosis. IMPRESSION: 1. No acute cervical spine fracture. 2. Advanced cervical disc and facet degeneration. Electronically Signed   By: Sebastian Ache M.D.   On: 09/11/2020 19:01   DG Pelvis Portable  Result Date: 09/11/2020 CLINICAL DATA:  trauma EXAM: PORTABLE PELVIS 1-2 VIEWS COMPARISON:  December 15, 2019 FINDINGS: Osteopenia. Excreted contrast limits evaluation of the sacrum. No acute displaced fracture seen on single view. No pelvic diastasis. Vascular calcifications. Degenerative changes of the lumbar spine. IMPRESSION: No acute fracture on single view. If persistent concern for nondisplaced hip or pelvic fracture, recommend dedicated pelvic MRI. Electronically Signed   By: Meda Klinefelter MD   On: 09/11/2020 19:13   Portable Chest x-ray  Result Date: 09/11/2020 CLINICAL DATA:  87 year old male status post intubation. EXAM: PORTABLE CHEST 1 VIEW COMPARISON:  Earlier radiograph dated 09/11/2020. FINDINGS: Interval placement of an endotracheal tube with tip approximately 6 cm above the carina. Enteric tube with side-port in the distal esophagus and tip close to the GE junction. Recommend further advancing of the enteric tube by at least additional 10 cm. Diffuse interstitial  coarsening and left lung base atelectasis similar to prior radiograph. Stable cardiomediastinal silhouette. Atherosclerotic calcification of the aorta. No acute osseous pathology. IMPRESSION: 1. Endotracheal tube above the carina. 2. Enteric tube with side-port in the distal esophagus and tip close to the GE junction. Recommend further advancing of the enteric tube by at least 10 cm. Electronically Signed   By: Elgie Collard M.D.   On: 09/11/2020 23:08   DG Chest Portable 1 View  Result Date: 09/11/2020 CLINICAL DATA:  trauma EXAM: PORTABLE CHEST 1 VIEW COMPARISON:  October 23, 2019, October 03, 2019 FINDINGS: Evaluation is limited secondary to patient rotation. The cardiomediastinal silhouette is grossly unchanged in contour. No pleural effusion. No pneumothorax. Diffuse interstitial prominence. Visualized abdomen is unremarkable. Compression fracture deformity of the T11, unchanged in comparison to prior. Compression fracture of T9 appears similar comparison to prior. IMPRESSION: Diffuse interstitial prominence likely reflecting underlying pulmonary edema. Differential considerations include atypical infection. Electronically Signed   By: Meda Klinefelter MD   On: 09/11/2020 19:12   DG Hand Complete Left  Result Date: 09/11/2020 CLINICAL DATA:  fall EXAM: LEFT HAND - COMPLETE 3+ VIEW; LEFT WRIST - COMPLETE 3+ VIEW COMPARISON:  December 15, 2019 common March 13, 2020 FINDINGS:  Revisualization of the sequela of an impacted fracture of the distal LEFT radius and ulna. There is mature osseous bridging with scattered residual areas of lucency from prior fracture sites. Fracture fragments are in unchanged alignment. No definitive acute fracture. Vascular calcifications. Osteopenia. Degenerative changes throughout the DIPs and PIPs. IMPRESSION: Revisualization of sequela of prior impacted fracture of the distal radius and ulna. Evaluation for superimposed acute fracture is limited due to osteopenia and  underlying chronic osseous remodeling. No definitive superimposed acute fracture is noted. If persistent clinical concern for scaphoid fracture, recommend immobilization and follow-up radiographs in 2 weeks versus MRI. Electronically Signed   By: Meda Klinefelter MD   On: 09/11/2020 19:17   CT HEAD CODE STROKE WO CONTRAST  Result Date: 09/11/2020 CLINICAL DATA:  Code stroke. Neuro deficit, acute, stroke suspected. EXAM: CT HEAD WITHOUT CONTRAST TECHNIQUE: Contiguous axial images were obtained from the base of the skull through the vertex without intravenous contrast. COMPARISON:  09/27/2019 FINDINGS: Brain: There is no evidence of an acute infarct, intracranial hemorrhage, mass, midline shift, or extra-axial fluid collection. There is mild-to-moderate cerebral atrophy. Patchy hypodensities in the cerebral white matter bilaterally are similar to the prior motion degraded CT and are nonspecific but compatible with moderate chronic small vessel ischemic disease. A chronic infarct is again noted in the left basal ganglia. There is also a small chronic right cerebellar infarct. Vascular: Calcified atherosclerosis at the skull base. No hyperdense vessel. Skull: No fracture or suspicious osseous lesion. Sinuses/Orbits: Paranasal sinuses and mastoid air cells are clear. Bilateral cataract extraction. Other: None. ASPECTS [facility] Stroke Program Early CT Score) - Ganglionic level infarction (caudate, lentiform nuclei, internal capsule, insula, M1-M3 cortex): 7 - Supraganglionic infarction (M4-M6 cortex): 3 Total score (0-10 with 10 being normal): 10 IMPRESSION: 1. No evidence of acute intracranial abnormality. 2. ASPECTS is 10. 3. Moderate chronic small vessel ischemic disease. These results were communicated to Dr. Selina Cooley at 6:21 pm on 09/11/2020 by text page via the [facility] messaging system. Electronically Signed   By: Sebastian Ache M.D.   On: 09/11/2020 18:23   CT ANGIO HEAD NECK W WO CM W PERF (CODE STROKE)  Result  Date: 09/11/2020 CLINICAL DATA:  Neuro deficit, acute, stroke suspected. EXAM: CT ANGIOGRAPHY HEAD AND NECK CT PERFUSION BRAIN TECHNIQUE: Multidetector CT imaging of the head and neck was performed using the standard protocol during bolus administration of intravenous contrast. Multiplanar CT image reconstructions and MIPs were obtained to evaluate the vascular anatomy. Carotid stenosis measurements (when applicable) are obtained utilizing NASCET criteria, using the distal internal carotid diameter as the denominator. Multiphase CT imaging of the brain was performed following IV bolus contrast injection. Subsequent parametric perfusion maps were calculated using RAPID software. CONTRAST:  38mL OMNIPAQUE IOHEXOL 350 MG/ML SOLN COMPARISON:  None. FINDINGS: CTA NECK FINDINGS Aortic arch: Normal variant aortic arch branching pattern with common origin of the brachiocephalic and left common carotid arteries. Mild atherosclerotic plaque without arch vessel origin stenosis. Right carotid system: Patent with predominantly calcified plaque at the carotid bifurcation. No evidence of a significant stenosis or dissection. Left carotid system: Patent with mixed calcified and soft plaque at the carotid bifurcation. No evidence of a significant stenosis or dissection. Vertebral arteries: Patent with the left being mildly dominant. Scattered atherosclerosis bilaterally resulting in severe proximal V1 and mild V3 stenoses on the left. Skeleton: See separate cervical spine CT report. Other neck: Subcentimeter thyroid nodules for which no imaging follow-up is recommended. Upper chest: Mild motion artifact. No apical  lung consolidation or mass. Review of the MIP images confirms the above findings CTA HEAD FINDINGS Anterior circulation: The internal carotid arteries are patent from skull base to carotid termini with atherosclerotic plaque resulting in moderate bilateral paraclinoid stenoses. The right M1 segment is patent, however there  is a proximal M2 occlusion without significant distal reconstitution. The left MCA is patent with a mild M1 stenosis noted. The ACAs are patent with mild-to-moderate stenosis of the left A1 segment as well as moderate irregular narrowing of both A2 segments. No aneurysm is identified. Posterior circulation: The intracranial vertebral arteries are patent to the basilar with atherosclerosis resulting in irregularity bilaterally and a moderate stenosis on the right. The basilar artery is patent with a severe stenosis in its midportion. The right PCA is patent with a severe stenosis near the P1-P2 junction. There are severe stenoses of both PCAs near the P1-P2 junctions with poor flow distally, particularly on the left. No aneurysm is identified. Venous sinuses: As permitted by contrast timing, patent. Anatomic variants: None. Review of the MIP images confirms the above findings CT Brain Perfusion Findings: ASPECTS: 10 CBF (<30%) Volume: 40 mL Perfusion (Tmax>6.0s) volume: 184 mL Mismatch Volume: 144 mL Infarction Location: Right frontal and temporal lobes primarily at the level of the operculum (MCA territory) IMPRESSION: 1. Proximal right M2 occlusion. 2. CTP demonstrates a right MCA core infarct with large penumbra as detailed above. 3. Advanced intracranial atherosclerosis including moderate bilateral ICA stenoses, a severe basilar artery stenosis, and severe bilateral proximal PCA stenoses. 4. Cervical carotid atherosclerosis without significant stenosis. 5. Severe proximal left vertebral artery stenosis. Emergent findings were communicated to Dr. Selina CooleyStack at 6:35 pm on 09/11/2020 by text page via the [facility] messaging system. Electronically Signed   By: Sebastian AcheAllen  Grady M.D.   On: 09/11/2020 18:51   CT Maxillofacial Wo Contrast  Result Date: 09/11/2020 CLINICAL DATA:  Facial trauma. EXAM: CT MAXILLOFACIAL WITHOUT CONTRAST TECHNIQUE: Multidetector CT imaging of the maxillofacial structures was performed. Multiplanar CT  image reconstructions were also generated. COMPARISON:  Head CT 09/27/2019 FINDINGS: Osseous: No acute fracture, mandibular dislocation, or destructive osseous process. Orbits: Bilateral cataract extraction and proptosis. No orbital hematoma or mass. Sinuses: Paranasal sinuses and mastoid air cells are clear. Mild rightward nasal septal deviation. Soft tissues: Punctate left parotid calcification.  Atherosclerosis. Limited intracranial: More fully evaluated on separate head CT. IMPRESSION: No acute maxillofacial fracture. Electronically Signed   By: Sebastian AcheAllen  Grady M.D.   On: 09/11/2020 18:55    ECG - atrial fibrillation - ventricular response 94 BPM (See cardiology reading for complete details)  PHYSICAL EXAM  Temp:  [93.8 F (34.3 C)-97.5 F (36.4 C)] 97.5 F (36.4 C) (07/24 0800) Pulse Rate:  [59-124] 67 (07/24 0900) Resp:  [14-23] 15 (07/24 0900) BP: (90-160)/(55-105) 111/65 (07/24 0900) SpO2:  [96 %-100 %] 100 % (07/24 0900) Arterial Line BP: (98-160)/(56-92) 120/61 (07/24 0900) FiO2 (%):  [40 %] 40 % (07/24 0830) Weight:  [68 kg] 68 kg (07/23 1830)  General - Well nourished, well developed, intubated off sedation.  Ophthalmologic - fundi not visualized due to noncooperation.  Cardiovascular - irregularly irregular heart rate and rhythm.  Neuro - intubated off sedation, eyes closed, not following commands. With forced eye opening, eyes in mid position, chronic left eye mild propotosis, not blinking to visual threat, doll's eyes absent, not tracking, PERRL. Corneal reflex left absent and right weakly present, gag and cough present. Breathing over the vent.  Facial symmetry not able to test due  to ET tube.  Tongue protrusion not cooperative. RUE and BLEs spontaneous movement, RUE against gravity at least 3+/5, BLEs able to barely against gravity, R stronger than left, LUE withdraw on pain stimulation, not against gravity. No babinski. Sensation, coordination and gait not  tested.   ASSESSMENT/PLAN Mr. TRAVIOUS VANOVER is a 87 y.o. male with history of DM2, CKD stage 3a, HTN, HL, hx a fib off AC 2/2 hx GIB, mild cognitive impairment and frequent falls who presented after a fall, non verbal and L sided weakness at ALF. He did not receive IV t-PA due to head trauma with fall. The pt was taken to IR for intervention for thrombectomy Rt M2 lesion.  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 CT Head - No evidence of acute intracranial abnormality.  CTA H&N - Proximal right M2 occlusion. Advanced intracranial atherosclerosis including moderate bilateral ICA stenoses, a severe basilar artery stenosis, and severe bilateral proximal PCA stenoses.  CTP 40/144 cc, positive for penumbra MRI head - pending 2D Echo - pending LDL - 100 HgbA1c - pending VTE prophylaxis -Lovenox No antithrombotics prior to admission, now on aspirin 325. Ongoing aggressive stroke risk factor management Therapy recommendations:  pending Disposition:  Pending  Chronic Afib Was on Coumadin in the past Coumadin discontinued in 10/2019 due to GI bleeding Follow-up with cardiology, wife and pt declined watchman device  Now on ASA Will consider AC with eliquis in 7 to 10 days post stroke (see below)  Hx of GIB Per discharge in 10/2019 "Of note, patient had recent GI bleed last month with no source of bleeding found on colonoscopy although did have a polypack to me for multiple submucosal polyps. Was plan for further polyp removal outpatient but has not done so yet. Patient had episode of large-volume hematochezia on 10/26/2019. GI was consulted and originally reccommended observation. Serial CBCs were obtained with downtrending Hgbs, GI performed colonoscopy on 10/29/19 which revealed large fecal impaction with underlying deep ulcers in the rectum (nonbleeding) consistent with stercoral ulcers. This was thought to  be the cause of the bleeding. Large borders solid stool was removed in combination with digital disimpaction, irrigation, and snare and rat-tooth forceps. Patient received Fleet enema x1 and increase MiraLAX daily 34 mg with continuing daily fiber supplementation. Important to maintain a daily aggressive bowel regimen ondischarge. Do not feel above GI findings are contraindicated for [facility], especially now pt had stroke due to not on [facility] Will consider eliquis in 7-10 days post stroke Continue ASA for now  Respiratory failure Intubated for procedure, still intubated post procedure due to mental status and not able to protect airway CCM on board Extubate as able  ? Seizure-like activity Post procedure twitching of right arm and foot noted NOT fit to pt right sided infarct Pt loaded with Keppra x 1 LTM EEG pending  Hypertension Stable on the low end Neo PRN On IVF Long-term BP goal normotensive  Hyperlipidemia Home Lipid lowering medication: unknown LDL 100, goal < 70 Current lipid lowering medication: lipitor 40 Continue statin at discharge  Diabetes Home diabetic meds: unknown hyperglycemia SSI  CBG monitoring HgbA1c - pending, goal < 7.0 PCP follow up   Other Stroke Risk Factors Advanced age  Other Active Problems Code status - Full code CKD - stage 3b - Cre - 2.5->2.27 Chronic fractures related to fall - hip, pelvic and LEFT radius and ulna fxs  Mild leukocytosis -  WBCs - 11.4 (afebrile) Frequent falls   Hospital day # 1  This patient is critically ill due to right MCA stroke, status post thrombectomy, seizure-like activity, chronic A. fib, hyperglycemia and at significant risk of neurological worsening, death form recurrent stroke, hemorrhagic conversion, status epilepticus, heart failure, DKA, aspiration pneumonia, sepsis. This patient's care requires constant monitoring of vital signs, hemodynamics, respiratory and cardiac monitoring, review of multiple databases,  neurological assessment, discussion with family, other specialists and medical decision making of high complexity. I spent 45 minutes of neurocritical care time in the care of this patient. I had long discussion with daughter and granddaughter at bedside, updated pt current condition, treatment plan and potential prognosis, and answered all the questions.  They expressed understanding and appreciation.   Marvel Plan, MD PhD Stroke Neurology 09/12/2020 4:15 PM    To contact Stroke Continuity provider, please refer to [email or web address]. After hours, contact General Neurology

## 2020-09-12 NOTE — Progress Notes (Signed)
vLTM EEG started. Notified Neuro 

## 2020-09-12 NOTE — Progress Notes (Signed)
eLink Physician-Brief Progress Note Patient Name: Andre Adams DOB: 1933/04/20 MRN: [id number]   Date of Service  09/12/2020  HPI/Events of Note  59M with hx of T2DM, CKD, HTN and Afib (not on A/C due to prior GI bleed and frequent falls) admitted with acute stroke (R M2 occlusion) with L hemiparesis and aphasia.  No tPA given due to fall with head trauma. Mechanical thrombectomy performed by IR. Patient now comes to ICU post-procedure as he remains intubated. Vent settings 520x16, 5, 40%. HR 70s (Afib), BP 119/64 (83) by arterial line (correlating with NIBP), RR 16 (synchronous with vent) and SpO2 100%.   eICU Interventions  # Neuro: - Propofol drip off for neuro assessment. - S/p mechanical thrombectomy for R M2 acute stroke: neuro following, CT at 24 hrs with ASA initiation if no e/o hemorrhage, stroke labs.  # Resp: - Continue mechanical ventilation as above but reduce RR to 14/min. SAT/SBT in AM.  # Cardiac: - Afib: Should be reconsidered for anticoagulation given change in risk/benefit balance. - LR at 75 mL/hr.  # Renal: - CKD (baseline unclear; might be superimposed AKI): Gentle IVF as above. CTM.  DVT PPX: SCDs only for now. GI PPX: Protonix      Intervention Category Evaluation Type: New Patient Evaluation  Janae Bridgeman 09/12/2020, 12:57 AM

## 2020-09-12 NOTE — Progress Notes (Signed)
SLP Cancellation Note  Patient Details Name: Andre Adams MRN: [id number] DOB: [birth year]   Cancelled treatment:        Attempted to see pt for cognitive linguistic evaluation.  Pt is not medically ready and requires mechanical ventilation at this time.  SLP will monitor.  Consider placing orders for swallowing evaluation after extubation prior to initiation of PO diet.    Kerrie Pleasure, MA, CCC-SLP Acute Rehabilitation Services Office: [phone]; Pager (7/24): [phone] 09/12/2020, 9:45 AM

## 2020-09-12 NOTE — Progress Notes (Signed)
EEG completed, results pending. 

## 2020-09-12 NOTE — Progress Notes (Signed)
Notified Dr. Judeth Horn that patient's urine output was only 200 ml, but clear and yellow. Bladder scan showed 461 ml. MD is okay to monitor bladder volume closely until volume>600 ml, then place foley catheter. MD verbalized he would start retention medications. Will report to night shift RN to bladder scan at 2000 and q 2 h if pt unable to void.

## 2020-09-12 NOTE — Progress Notes (Addendum)
eLink Physician-Brief Progress Note Patient Name: Andre Adams DOB: [birth year] MRN: [id number]   Date of Service  09/12/2020  HPI/Events of Note  Patient is now awake and following all commands per RN. I suspect that there may have been some residual paralytic effect causing his earlier unresponsiveness which progressed to twitching mimicking seizure activity (as the paralytic wore off). He only weighs 68kg but he received a total of 130mg  of rocuronium during the procedure -- this combined with his significant CKD may have prolonged the duration of action.  RN reports patient gets hypotensive with propofol but is normotensive without propofol.   eICU Interventions  Ordered Precedex instead for some light sedation/anxiolysis until ready to assess for extubation in AM.  Also ordered 500 mL LR bolus over an hour given his sensitivity to propofol suggests he may be slightly volume depleted.   ADDENDUM 09/12/20 4:17 AM  - RN reports patient's BP dropped with Precedex initiation as well (which was started after the LR 500 mL bolus). Further, his SBP remains 93/69 even after Precedex has been off for the past 15-20 minutes. - RN states that his groin access site had a small hematoma when he first returned to the ICU post-procedure, but that was re-inspected just now and looks better than it did earlier. Lactic acid was 3.0 at 1845 yesterday. Not repeated. - Send STAT CBC (r/o vascular access complication) and repeat lactic acid. - Give an additional 09/14/20 LR bolus and start peripheral phenylephrine drip to maintain MAP >\= 70 mmHg.  Intervention Category Intermediate Interventions: Hypotension - evaluation and management  09/12/2020, 3:43 AM

## 2020-09-12 NOTE — Progress Notes (Signed)
OT Cancellation Note  Patient Details Name: Andre Adams MRN: [id number] DOB: [birth year]   Cancelled Treatment:    Reason Eval/Treat Not Completed: Active bedrest order;Patient not medically ready.  Pt currently on vent and sedated.  Eber Jones., OTR/L Acute Rehabilitation Services Pager [phone] Office [phone]   Jeani Hawking M 09/12/2020, 8:20 AM

## 2020-09-12 NOTE — Progress Notes (Signed)
Date and time results received: 09/12/20  @ 0520  Test: Lactic Acid Critical Value: 3.1  Name of Provider Notified: Dustin Flock RN  Orders Received? Or Actions Taken?: Awaiting orders from E-Link MD

## 2020-09-12 NOTE — Progress Notes (Signed)
Per recommendations from radiologist OG was advanced ~ 10 cm. Confirmation X-ray ordered confirming gastric placement. MD notified.

## 2020-09-12 NOTE — Progress Notes (Signed)
Dr. Izora Gala notified of patient's neuro change (slightly unequal pupils). Also notified of patient's generalized twitching motions in all four extremities. Dr. Izora Gala on the way to assess at bedside.

## 2020-09-12 NOTE — Progress Notes (Signed)
Patient transported to MRI and back without complications. RN at bedside. 

## 2020-09-12 NOTE — Procedures (Signed)
History: R sided twitching  Sedation: none  Technique: This is a 21 channel routine scalp EEG performed at the bedside with bipolar and monopolar montages arranged in accordance to the international 10/20 system of electrode placement. One channel was dedicated to EKG recording.    Background: There is a posterior.rhythm seen at times with a frequency of 8 to 9 Hz.  In addition, there is diffuse alpha and beta range activities consistent with patient's sedated state.  There is diffuse irregular delta and theta which is also consistent with patient's sedation.  There are sharply contoured theta and alpha range activity over the temporal lobes which could be consistent with that with the rhythm, but there are also intermittent sharply contoured waves which appear to disrupt the background consistent with sharp waves.  These are maximal at  P7, 01, P3.  Photic stimulation: Physiologic driving is not performed  EEG Abnormalities: 1) Left posterior quadrant epileptiform discharges  Clinical Interpretation: This EEG is consistent with a left posterior quadrant potential area of epileptogenicity. There was no seizure recorded on this study.   Ritta Slot, MD [facility] [phone]  If 7pm- 7am, please page neurology on call as listed in AMION.

## 2020-09-12 NOTE — Progress Notes (Signed)
Referring Physician(s): Dr. Selina CooleyStack, C.  Supervising Physician: Julieanne Cottoneveshwar, Sanjeev  [facility]  [facility]  Chief Complaint:  Code stroke S/p RT common carotid arteriogram via RT CFA approach, revascularization of occluded division Rt MCA achieving a TICI 2C revascularization with Dr. Corliss Skainseveshwar on 09/11/20  Subjective:  Patient laying in bed, intubated.  Family members, RNs, Dr. Roda ShuttersXu from neurology at the bedside. Patient has been off sedation around 8 AM this morning per RN.  Allergies: Baclofen  Medications: Prior to Admission medications   Not on File     Vital Signs: BP (!) 101/58   Pulse 60   Temp (!) 97.5 F (36.4 C) (Axillary)   Resp 17   Ht 5\' 7"  (1.702 m)   Wt 150 lb (68 kg)   SpO2 100%   BMI 23.49 kg/m   Physical Exam Vitals reviewed.  Constitutional:      General: He is not in acute distress.    Appearance: He is not ill-appearing.     Comments: Intubated  HENT:     Head: Normocephalic and atraumatic.  Cardiovascular:     Comments: Bilateral DP dopplerable Abdominal:     General: Abdomen is flat.     Palpations: Abdomen is soft.  Skin:    General: Skin is warm and dry.     Comments: Positive dressing on right CFA puncture site. Site is unremarkable with no erythema, edema, tenderness, bleeding or drainage. Minimal amount of old, dry blood note son the dressing. Dressing otherwise clean, dry, and intact.    Neurological:     Comments: Intubated, patient kept his eyes closed during assessment None responsive to verbal stimuli Responsive to painful stimuli, moves all 4 extremities spontaneously.  More movement noted on the right side.  Pupil slightly asymmetric per Dr. Roda ShuttersXu     Imaging: DG Wrist Complete Left  Result Date: 09/11/2020 CLINICAL DATA:  fall EXAM: LEFT HAND - COMPLETE 3+ VIEW; LEFT WRIST - COMPLETE 3+ VIEW COMPARISON:  December 15, 2019 common March 13, 2020 FINDINGS: Revisualization of the sequela of an impacted fracture of  the distal LEFT radius and ulna. There is mature osseous bridging with scattered residual areas of lucency from prior fracture sites. Fracture fragments are in unchanged alignment. No definitive acute fracture. Vascular calcifications. Osteopenia. Degenerative changes throughout the DIPs and PIPs. IMPRESSION: Revisualization of sequela of prior impacted fracture of the distal radius and ulna. Evaluation for superimposed acute fracture is limited due to osteopenia and underlying chronic osseous remodeling. No definitive superimposed acute fracture is noted. If persistent clinical concern for scaphoid fracture, recommend immobilization and follow-up radiographs in 2 weeks versus MRI. Electronically Signed   By: Meda KlinefelterStephanie  Peacock MD   On: 09/11/2020 19:17   CT HEAD WO CONTRAST  Result Date: 09/12/2020 CLINICAL DATA:  Status post thrombectomy EXAM: CT HEAD WITHOUT CONTRAST TECHNIQUE: Contiguous axial images were obtained from the base of the skull through the vertex without intravenous contrast. COMPARISON:  None. FINDINGS: Brain: There is hyperdensity over the right MCA territory, likely contrast staining. There is generalized atrophy without lobar predilection. There is periventricular hypoattenuation compatible with chronic microvascular disease. Vascular: No abnormal hyperdensity of the major intracranial arteries or dural venous sinuses. No intracranial atherosclerosis. Skull: The visualized skull base, calvarium and extracranial soft tissues are normal. Sinuses/Orbits: No fluid levels or advanced mucosal thickening of the visualized paranasal sinuses. No mastoid or middle ear effusion. The orbits are normal. IMPRESSION: Hyperdensity over the right MCA territory, likely contrast  staining. Electronically Signed   By: Deatra Robinson M.D.   On: 09/12/2020 02:55   CT Cervical Spine Wo Contrast  Result Date: 09/11/2020 CLINICAL DATA:  Facial trauma. EXAM: CT CERVICAL SPINE WITHOUT CONTRAST TECHNIQUE:  Multidetector CT imaging of the cervical spine was performed without intravenous contrast. Multiplanar CT image reconstructions were also generated. COMPARISON:  None. FINDINGS: Alignment: No evidence of acute traumatic subluxation. Skull base and vertebrae: No acute cervical spine fracture or suspicious osseous lesion. Mild superior endplate compression fracture at T3, likely chronic. Soft tissues and spinal canal: No prevertebral fluid or swelling. No visible canal hematoma. Disc levels: Moderate to severe disc space narrowing from C4-5 to C6-7. Severe bilateral facet arthrosis at C3-4 with small subchondral cysts or erosions and joint widening on the right. Right facet ankylosis at C4-5. No evidence of high-grade spinal stenosis or high-grade neural foraminal stenosis. Upper chest: No apical lung consolidation or mass. Other: Subcentimeter thyroid nodules for which no imaging follow-up is recommended. Carotid atherosclerosis. IMPRESSION: 1. No acute cervical spine fracture. 2. Advanced cervical disc and facet degeneration. Electronically Signed   By: Sebastian Ache M.D.   On: 09/11/2020 19:01   DG Pelvis Portable  Result Date: 09/11/2020 CLINICAL DATA:  trauma EXAM: PORTABLE PELVIS 1-2 VIEWS COMPARISON:  December 15, 2019 FINDINGS: Osteopenia. Excreted contrast limits evaluation of the sacrum. No acute displaced fracture seen on single view. No pelvic diastasis. Vascular calcifications. Degenerative changes of the lumbar spine. IMPRESSION: No acute fracture on single view. If persistent concern for nondisplaced hip or pelvic fracture, recommend dedicated pelvic MRI. Electronically Signed   By: Meda Klinefelter MD   On: 09/11/2020 19:13   DG CHEST PORT 1 VIEW  Result Date: 09/12/2020 CLINICAL DATA:  Code stroke. EXAM: PORTABLE CHEST 1 VIEW COMPARISON:  09/11/2020 FINDINGS: 0629 hours. Endotracheal tube tip is 5.4 cm above the base of the carina. NG tube tip is in the distal esophagus with proximal side port  in the mid esophagus. This tube could be advanced approximately 12-13 cm to place the proximal side port below the GE junction. Interstitial markings are diffusely coarsened with chronic features. Interval decrease in retrocardiac left base atelectasis. Bones are diffusely demineralized. Telemetry leads overlie the chest. IMPRESSION: 1. Endotracheal tube tip is 5.4 cm above the base of the carina. 2. NG tube tip is in the distal esophagus, see above. 3. Persistent chronic interstitial coarsening with interval improvement in retrocardiac left base atelectasis. Electronically Signed   By: Kennith Center M.D.   On: 09/12/2020 08:59   Portable Chest x-ray  Result Date: 09/11/2020 CLINICAL DATA:  87 year old male status post intubation. EXAM: PORTABLE CHEST 1 VIEW COMPARISON:  Earlier radiograph dated 09/11/2020. FINDINGS: Interval placement of an endotracheal tube with tip approximately 6 cm above the carina. Enteric tube with side-port in the distal esophagus and tip close to the GE junction. Recommend further advancing of the enteric tube by at least additional 10 cm. Diffuse interstitial coarsening and left lung base atelectasis similar to prior radiograph. Stable cardiomediastinal silhouette. Atherosclerotic calcification of the aorta. No acute osseous pathology. IMPRESSION: 1. Endotracheal tube above the carina. 2. Enteric tube with side-port in the distal esophagus and tip close to the GE junction. Recommend further advancing of the enteric tube by at least 10 cm. Electronically Signed   By: Elgie Collard M.D.   On: 09/11/2020 23:08   DG Chest Portable 1 View  Result Date: 09/11/2020 CLINICAL DATA:  trauma EXAM: PORTABLE CHEST 1 VIEW COMPARISON:  October 23, 2019, October 03, 2019 FINDINGS: Evaluation is limited secondary to patient rotation. The cardiomediastinal silhouette is grossly unchanged in contour. No pleural effusion. No pneumothorax. Diffuse interstitial prominence. Visualized abdomen is  unremarkable. Compression fracture deformity of the T11, unchanged in comparison to prior. Compression fracture of T9 appears similar comparison to prior. IMPRESSION: Diffuse interstitial prominence likely reflecting underlying pulmonary edema. Differential considerations include atypical infection. Electronically Signed   By: Meda Klinefelter MD   On: 09/11/2020 19:12   DG Hand Complete Left  Result Date: 09/11/2020 CLINICAL DATA:  fall EXAM: LEFT HAND - COMPLETE 3+ VIEW; LEFT WRIST - COMPLETE 3+ VIEW COMPARISON:  December 15, 2019 common March 13, 2020 FINDINGS: Revisualization of the sequela of an impacted fracture of the distal LEFT radius and ulna. There is mature osseous bridging with scattered residual areas of lucency from prior fracture sites. Fracture fragments are in unchanged alignment. No definitive acute fracture. Vascular calcifications. Osteopenia. Degenerative changes throughout the DIPs and PIPs. IMPRESSION: Revisualization of sequela of prior impacted fracture of the distal radius and ulna. Evaluation for superimposed acute fracture is limited due to osteopenia and underlying chronic osseous remodeling. No definitive superimposed acute fracture is noted. If persistent clinical concern for scaphoid fracture, recommend immobilization and follow-up radiographs in 2 weeks versus MRI. Electronically Signed   By: Meda Klinefelter MD   On: 09/11/2020 19:17   CT HEAD CODE STROKE WO CONTRAST  Result Date: 09/11/2020 CLINICAL DATA:  Code stroke. Neuro deficit, acute, stroke suspected. EXAM: CT HEAD WITHOUT CONTRAST TECHNIQUE: Contiguous axial images were obtained from the base of the skull through the vertex without intravenous contrast. COMPARISON:  09/27/2019 FINDINGS: Brain: There is no evidence of an acute infarct, intracranial hemorrhage, mass, midline shift, or extra-axial fluid collection. There is mild-to-moderate cerebral atrophy. Patchy hypodensities in the cerebral white matter  bilaterally are similar to the prior motion degraded CT and are nonspecific but compatible with moderate chronic small vessel ischemic disease. A chronic infarct is again noted in the left basal ganglia. There is also a small chronic right cerebellar infarct. Vascular: Calcified atherosclerosis at the skull base. No hyperdense vessel. Skull: No fracture or suspicious osseous lesion. Sinuses/Orbits: Paranasal sinuses and mastoid air cells are clear. Bilateral cataract extraction. Other: None. ASPECTS [facility] Stroke Program Early CT Score) - Ganglionic level infarction (caudate, lentiform nuclei, internal capsule, insula, M1-M3 cortex): 7 - Supraganglionic infarction (M4-M6 cortex): 3 Total score (0-10 with 10 being normal): 10 IMPRESSION: 1. No evidence of acute intracranial abnormality. 2. ASPECTS is 10. 3. Moderate chronic small vessel ischemic disease. These results were communicated to Dr. Selina Cooley at 6:21 pm on 09/11/2020 by text page via the [facility] messaging system. Electronically Signed   By: Sebastian Ache M.D.   On: 09/11/2020 18:23   CT ANGIO HEAD NECK W WO CM W PERF (CODE STROKE)  Result Date: 09/11/2020 CLINICAL DATA:  Neuro deficit, acute, stroke suspected. EXAM: CT ANGIOGRAPHY HEAD AND NECK CT PERFUSION BRAIN TECHNIQUE: Multidetector CT imaging of the head and neck was performed using the standard protocol during bolus administration of intravenous contrast. Multiplanar CT image reconstructions and MIPs were obtained to evaluate the vascular anatomy. Carotid stenosis measurements (when applicable) are obtained utilizing NASCET criteria, using the distal internal carotid diameter as the denominator. Multiphase CT imaging of the brain was performed following IV bolus contrast injection. Subsequent parametric perfusion maps were calculated using RAPID software. CONTRAST:  48mL OMNIPAQUE IOHEXOL 350 MG/ML SOLN COMPARISON:  None. FINDINGS: CTA NECK FINDINGS  Aortic arch: Normal variant aortic arch branching  pattern with common origin of the brachiocephalic and left common carotid arteries. Mild atherosclerotic plaque without arch vessel origin stenosis. Right carotid system: Patent with predominantly calcified plaque at the carotid bifurcation. No evidence of a significant stenosis or dissection. Left carotid system: Patent with mixed calcified and soft plaque at the carotid bifurcation. No evidence of a significant stenosis or dissection. Vertebral arteries: Patent with the left being mildly dominant. Scattered atherosclerosis bilaterally resulting in severe proximal V1 and mild V3 stenoses on the left. Skeleton: See separate cervical spine CT report. Other neck: Subcentimeter thyroid nodules for which no imaging follow-up is recommended. Upper chest: Mild motion artifact. No apical lung consolidation or mass. Review of the MIP images confirms the above findings CTA HEAD FINDINGS Anterior circulation: The internal carotid arteries are patent from skull base to carotid termini with atherosclerotic plaque resulting in moderate bilateral paraclinoid stenoses. The right M1 segment is patent, however there is a proximal M2 occlusion without significant distal reconstitution. The left MCA is patent with a mild M1 stenosis noted. The ACAs are patent with mild-to-moderate stenosis of the left A1 segment as well as moderate irregular narrowing of both A2 segments. No aneurysm is identified. Posterior circulation: The intracranial vertebral arteries are patent to the basilar with atherosclerosis resulting in irregularity bilaterally and a moderate stenosis on the right. The basilar artery is patent with a severe stenosis in its midportion. The right PCA is patent with a severe stenosis near the P1-P2 junction. There are severe stenoses of both PCAs near the P1-P2 junctions with poor flow distally, particularly on the left. No aneurysm is identified. Venous sinuses: As permitted by contrast timing, patent. Anatomic variants:  None. Review of the MIP images confirms the above findings CT Brain Perfusion Findings: ASPECTS: 10 CBF (<30%) Volume: 40 mL Perfusion (Tmax>6.0s) volume: 184 mL Mismatch Volume: 144 mL Infarction Location: Right frontal and temporal lobes primarily at the level of the operculum (MCA territory) IMPRESSION: 1. Proximal right M2 occlusion. 2. CTP demonstrates a right MCA core infarct with large penumbra as detailed above. 3. Advanced intracranial atherosclerosis including moderate bilateral ICA stenoses, a severe basilar artery stenosis, and severe bilateral proximal PCA stenoses. 4. Cervical carotid atherosclerosis without significant stenosis. 5. Severe proximal left vertebral artery stenosis. Emergent findings were communicated to Dr. Selina Cooley at 6:35 pm on 09/11/2020 by text page via the [facility] messaging system. Electronically Signed   By: Sebastian Ache M.D.   On: 09/11/2020 18:51   CT Maxillofacial Wo Contrast  Result Date: 09/11/2020 CLINICAL DATA:  Facial trauma. EXAM: CT MAXILLOFACIAL WITHOUT CONTRAST TECHNIQUE: Multidetector CT imaging of the maxillofacial structures was performed. Multiplanar CT image reconstructions were also generated. COMPARISON:  Head CT 09/27/2019 FINDINGS: Osseous: No acute fracture, mandibular dislocation, or destructive osseous process. Orbits: Bilateral cataract extraction and proptosis. No orbital hematoma or mass. Sinuses: Paranasal sinuses and mastoid air cells are clear. Mild rightward nasal septal deviation. Soft tissues: Punctate left parotid calcification.  Atherosclerosis. Limited intracranial: More fully evaluated on separate head CT. IMPRESSION: No acute maxillofacial fracture. Electronically Signed   By: Sebastian Ache M.D.   On: 09/11/2020 18:55    Labs:  CBC: Recent Labs    09/11/20 1801 09/11/20 2238 09/12/20 0417 09/12/20 0432  WBC  --   --  11.4*  --   HGB 17.0 13.3 13.3 13.3  HCT 50.0 39.0 40.7 39.0  PLT  --   --  134*  --  COAGS: Recent Labs     09/11/20 1800  INR 1.1  APTT 30    BMP: Recent Labs    09/11/20 1800 09/11/20 1801 09/11/20 2238 09/12/20 0417 09/12/20 0432  NA 136 136 139 136 139  K 4.4 4.2 4.2 4.3 4.4  CL 103 105  --  108  --   CO2 21*  --   --  18*  --   GLUCOSE 279* 272*  --  247*  --   BUN 44* 44*  --  37*  --   CALCIUM 9.1  --   --  8.0*  --   CREATININE 2.57* 2.50*  --  2.27*  --   GFRNONAA 24*  --   --  28*  --     LIVER FUNCTION TESTS: Recent Labs    09/11/20 1800  BILITOT 0.9  AST 18  ALT 10  ALKPHOS 87  PROT 6.3*  ALBUMIN 3.2*    Assessment and Plan:  87 year old male s/p code stroke, s/p common carotid arteriogram with revascularization of right MCA achieving TICI 2  with Dr. Corliss Skains on 09/11/2020.  Patient remained intubated, neuro exam performed by Dr. Roda Shutters. Patient nonresponsive to painful stimuli only, not able to follow simple commands. Able to move all 4 extremities spontaneously, more movement noted on the right. Pupils slightly asymmetric, per family members proptosis presented before the stroke.  Right CFA puncture site stable, no S/S of bleeding or infection. Bilateral DP dopplerable.   Per chart, patient had seizure-like activity overnight, placed on Keppra per neurology.  Further treatment plan per Stroke/PCCM Appreciate and agree with the plan.  NIR to follow.    Electronically Signed: Willette Brace, PA-C 09/12/2020, 11:03 AM   I spent a total of 25 Minutes at the the patient's bedside AND on the patient's hospital floor or unit, greater than 50% of which was counseling/coordinating care for code stroke

## 2020-09-12 NOTE — Progress Notes (Signed)
Patient was transported to and from CT w/o complications noted. Patient is stable

## 2020-09-12 NOTE — Consult Note (Signed)
NAME:  Andre Adams, MRN:  [id number], DOB:  December 09, 1933, LOS: 1 ADMISSION DATE:  09/11/2020, CONSULTATION DATE:  7/23 REFERRING MD:  Selina Cooley MD, CHIEF COMPLAINT:   fall and left sided weakness   History of Present Illness:  Patient is encephalopathic and/or intubated. Therefore history has been obtained from chart review.   Mr. Andre Adams is an 87 y.o. male  who is seen in consultation at the request of Dr. Selina Cooley for recommendations on further evaluation and management of mechanical ventilation. The patient presented from [facility] nursing facility  to [facility] ED  on 7/23 via [facility] with complaints of fall and left sided weakness. The patient was admitted to Neurology service for an acute ischemic R MCA stroke secondary to a R M2 occlusion.  It is reported that he has a mild cognitive impalement and walks with a walker. Marland Kitchen He presented to the [facility] ED around [facility] and was found to have left sided weaknesswith flaccid L dide arm/face/leg and was nonverbal. LKW 1445. NIHSS score 23. TPA was not give due to fall with head trauma. CTA head showed R MCA stroke secondary to a R M2 occlusion. He was taken to [facility] with Dr. Corliss Skains. At this time a NIR procedure note is not available.   He has a reported pmx of HTN, HL, Afib off AC due to GIB HX, CKD3, frequent falls  Pertinent  Medical History  HTN, HL, Afib off AC due to GIB HX, CKD3, frequent falls  Significant Hospital Events: Including procedures, antibiotic start and stop dates in addition to other pertinent events   7/23 Admit  Interim History / Subjective:  Loaded with keppra. Moves all extremities to pain, followed command LE for RN. Discussed care with family at bedside.  Objective   Blood pressure 105/62, pulse 72, temperature (!) 97.5 F (36.4 C), temperature source Axillary, resp. rate 15, height 5\' 7"  (1.702 m), weight 68 kg, SpO2 100 %.    Vent Mode: PSV;CPAP FiO2 (%):  [40 %] 40 % Set Rate:  [16 bmp] 16 bmp Vt Set:  [520  mL] 520 mL PEEP:  [5 cmH20] 5 cmH20 Pressure Support:  [8 cmH20] 8 cmH20 Plateau Pressure:  [14 cmH20] 14 cmH20   Intake/Output Summary (Last 24 hours) at 09/12/2020 09/14/2020 Last data filed at 09/12/2020 0700 Gross per 24 hour  Intake 3640.11 ml  Output 525 ml  Net 3115.11 ml    Filed Weights   09/11/20 1830  Weight: 68 kg     Examination: General:  frail, in bed, NAD HEENT: MM pink/moist, anicteric, Trachea midline Neuro: withdraws all extremities, does not follow commands, down going babinskis, initiating breaths CV: S1S2, afib on monitor, no m/r/g appreciated PULM:  clear in the upper lobes and clear in the lower lobes, chest expansion symmetric, not breathing over ventilator GI: soft, bsx4 active, nondistended Extremities: warm/dry, no pretibial edema, capillary refill greater/less than 3 seconds    Resolved Hospital Problem list     Assessment & Plan:  R MCA stroke secondary to a R M2 occlusion S/P thrombectomy Etiology unknown. Awaiting NIR note for procedure details. -Management and workup per neurology -Follow up MRI wo contrast -Follow up TTE -Continue neuro checks -PT/OT/Tele -Cleviprex GTT (not needed) vs phenylephrine. SBP goal per neuro medication orders. SBP 100-180. DBP < 105 -ASA, statin per neuro  Endotracheally Intubated Intubated for airway protection and NIR. CXR concerning for some pulmonary edema. Afebrile. No wbc level at this time. -PSV as tolerated, SBT,  mental status precludes extubation -VAP bundle -Daily SAT and SBT.  -PAD bundle with Propofol gtt. Goal RASS 0 to -1, titrate to goal  Concern for seizure: keppra loaded overnight, some diffuse twitching reported after stimulation. --EEG, additional AED per neurology  DM2 - SSI, escalate if start TF -Blood Glucose goal 140-180.  HX Atrial fibrillation off AC due to GIB hx Afib on EKG -Rate currently controlled -No current documentation of home medication -AC per neurology  CKD3 on  AKI Creat 2.50 on admission. Unclear baseline. Improving with fluids. -Ensure renal perfusion. Goal MAP 65 or greater. -Avoid neprotoxic drugs as possible. -Strict I&O's -Obtain bladder scan and cath if volume greater than 300 -LR at 75 for hydration   Best Practice (right click and "Reselect all SmartList Selections" daily)   Diet/type: NPO DVT prophylaxis: SCD GI prophylaxis: PPI Lines: N/A Foley:  N/A Code Status:  full code Last date of multidisciplinary goals of care discussion [Per primary]  Labs   CBC: Recent Labs  Lab 09/11/20 1801 09/11/20 2238 09/12/20 0417 09/12/20 0432  WBC  --   --  11.4*  --   HGB 17.0 13.3 13.3 13.3  HCT 50.0 39.0 40.7 39.0  MCV  --   --  98.8  --   PLT  --   --  134*  --      Basic Metabolic Panel: Recent Labs  Lab 09/11/20 1800 09/11/20 1801 09/11/20 2238 09/12/20 0417 09/12/20 0432  NA 136 136 139 136 139  K 4.4 4.2 4.2 4.3 4.4  CL 103 105  --  108  --   CO2 21*  --   --  18*  --   GLUCOSE 279* 272*  --  247*  --   BUN 44* 44*  --  37*  --   CREATININE 2.57* 2.50*  --  2.27*  --   CALCIUM 9.1  --   --  8.0*  --     GFR: Estimated Creatinine Clearance: 22.6 mL/min (A) (by C-G formula based on SCr of 2.27 mg/dL (H)). Recent Labs  Lab 09/11/20 1845 09/12/20 0417  WBC  --  11.4*  LATICACIDVEN 3.0* 3.1*     Liver Function Tests: Recent Labs  Lab 09/11/20 1800  AST 18  ALT 10  ALKPHOS 87  BILITOT 0.9  PROT 6.3*  ALBUMIN 3.2*    No results for input(s): LIPASE, AMYLASE in the last 168 hours. No results for input(s): AMMONIA in the last 168 hours.  ABG    Component Value Date/Time   PHART 7.345 (L) 09/12/2020 0432   PCO2ART 32.4 09/12/2020 0432   PO2ART 113 (H) 09/12/2020 0432   HCO3 17.8 (L) 09/12/2020 0432   TCO2 19 (L) 09/12/2020 0432   ACIDBASEDEF 7.0 (H) 09/12/2020 0432   O2SAT 98.0 09/12/2020 0432      Coagulation Profile: Recent Labs  Lab 09/11/20 1800  INR 1.1     Cardiac  Enzymes: No results for input(s): CKTOTAL, CKMB, CKMBINDEX, TROPONINI in the last 168 hours.  HbA1C: No results found for: HGBA1C  CBG: Recent Labs  Lab 09/11/20 1759 09/11/20 2327 09/12/20 0354 09/12/20 0746  GLUCAP 274* 254* 210* 201*     Review of Systems:   Unable to obtain a ROS evaluation due to patient status   Past Medical History:  He,  has no past medical history on file.   Surgical History:  No known history at this time  Social History:    No known history  at this time  Family History:  His family history is not on file.   Allergies Allergies  Allergen Reactions   Baclofen Other (See Comments)    lethargy     Home Medications  Prior to Admission medications   Not on File     Critical care time:     CRITICAL CARE Performed by: Karren Burly   Total critical care time: 40 minutes  Critical care time was exclusive of separately billable procedures and treating other patients.  Critical care was necessary to treat or prevent imminent or life-threatening deterioration.  Critical care was time spent personally by me on the following activities: development of treatment plan with patient and/or surrogate as well as nursing, discussions with consultants, evaluation of patient's response to treatment, examination of patient, obtaining history from patient or surrogate, ordering and performing treatments and interventions, ordering and review of laboratory studies, ordering and review of radiographic studies, pulse oximetry and re-evaluation of patient's condition.

## 2020-09-12 NOTE — Progress Notes (Signed)
PT Cancellation Note  Patient Details Name: Andre Adams MRN: [id number] DOB: 05-Mar-1933   Cancelled Treatment:    Reason Eval/Treat Not Completed: Active bedrest order Patient not medically ready.  Pt currently on vent and sedated.   Jerolyn Center, PT Pager [phone]   Zena Amos 09/12/2020, 9:23 AM

## 2020-09-12 NOTE — Progress Notes (Signed)
Date and time results received: 09/12/20  Test: Lactic Acid  Critical Value: 3.0  Name of Provider Notified: Karle Barr, NP  Orders Received? Or Actions Taken?: Repeat Lactic Acid draw at 0500.

## 2020-09-12 NOTE — Progress Notes (Signed)
Propofol turned back on - orders from neurology.

## 2020-09-13 ENCOUNTER — Encounter (HOSPITAL_COMMUNITY): Payer: Self-pay | Admitting: Interventional Radiology

## 2020-09-13 DIAGNOSIS — I639 Cerebral infarction, unspecified: Secondary | ICD-10-CM | POA: Diagnosis not present

## 2020-09-13 DIAGNOSIS — N179 Acute kidney failure, unspecified: Secondary | ICD-10-CM | POA: Diagnosis not present

## 2020-09-13 DIAGNOSIS — I63511 Cerebral infarction due to unspecified occlusion or stenosis of right middle cerebral artery: Secondary | ICD-10-CM | POA: Diagnosis not present

## 2020-09-13 DIAGNOSIS — I482 Chronic atrial fibrillation, unspecified: Secondary | ICD-10-CM | POA: Diagnosis not present

## 2020-09-13 LAB — RENAL FUNCTION PANEL
Albumin: 2.2 g/dL — ABNORMAL LOW (ref 3.5–5.0)
Anion gap: 7 (ref 5–15)
BUN: 42 mg/dL — ABNORMAL HIGH (ref 8–23)
CO2: 20 mmol/L — ABNORMAL LOW (ref 22–32)
Calcium: 8.1 mg/dL — ABNORMAL LOW (ref 8.9–10.3)
Chloride: 111 mmol/L (ref 98–111)
Creatinine, Ser: 2.34 mg/dL — ABNORMAL HIGH (ref 0.61–1.24)
GFR, Estimated: 27 mL/min — ABNORMAL LOW (ref >60)
Glucose, Bld: 154 mg/dL — ABNORMAL HIGH (ref 70–99)
Phosphorus: 3.3 mg/dL (ref 2.5–4.6)
Potassium: 3.9 mmol/L (ref 3.5–5.1)
Sodium: 138 mmol/L (ref 135–145)

## 2020-09-13 LAB — TRIGLYCERIDES: Triglycerides: 92 mg/dL (ref <150)

## 2020-09-13 LAB — CBC
HCT: 37.5 % — ABNORMAL LOW (ref 39.0–52.0)
Hemoglobin: 12.5 g/dL — ABNORMAL LOW (ref 13.0–17.0)
MCH: 32.7 pg (ref 26.0–34.0)
MCHC: 33.3 g/dL (ref 30.0–36.0)
MCV: 98.2 fL (ref 80.0–100.0)
Platelets: 124 10*3/uL — ABNORMAL LOW (ref 150–400)
RBC: 3.82 MIL/uL — ABNORMAL LOW (ref 4.22–5.81)
RDW: 13.2 % (ref 11.5–15.5)
WBC: 11.9 10*3/uL — ABNORMAL HIGH (ref 4.0–10.5)
nRBC: 0 % (ref 0.0–0.2)

## 2020-09-13 LAB — GLUCOSE, CAPILLARY
Glucose-Capillary: 100 mg/dL — ABNORMAL HIGH (ref 70–99)
Glucose-Capillary: 101 mg/dL — ABNORMAL HIGH (ref 70–99)
Glucose-Capillary: 143 mg/dL — ABNORMAL HIGH (ref 70–99)
Glucose-Capillary: 150 mg/dL — ABNORMAL HIGH (ref 70–99)
Glucose-Capillary: 157 mg/dL — ABNORMAL HIGH (ref 70–99)
Glucose-Capillary: 201 mg/dL — ABNORMAL HIGH (ref 70–99)

## 2020-09-13 LAB — HEMOGLOBIN A1C
Hgb A1c MFr Bld: 7.8 % — ABNORMAL HIGH (ref 4.8–5.6)
Mean Plasma Glucose: 177 mg/dL

## 2020-09-13 MED ORDER — DEXMEDETOMIDINE HCL IN NACL 400 MCG/100ML IV SOLN
0.0000 ug/kg/h | INTRAVENOUS | Status: DC
Start: 2020-09-13 — End: 2020-09-14

## 2020-09-13 MED ORDER — METOPROLOL TARTRATE 25 MG/10 ML ORAL SUSPENSION
25.0000 mg | Freq: Two times a day (BID) | ORAL | Status: DC
  Administered 2020-09-13 – 2020-09-23 (×17): 25 mg
  Filled 2020-09-13 (×20): qty 10

## 2020-09-13 NOTE — Procedures (Addendum)
Patient Name: Andre Adams  MRN: [id number]  Epilepsy Attending: Charlsie Quest  Referring Physician/Provider: Dr Marvel Plan Duration: 09/12/2020 1702 to 09/13/2020 1022  Patient history: 87 year old male with right MCA stroke noted to have right-sided twitching.  EEG to evaluate for seizures.  Level of alertness: Awake, asleep  AEDs during EEG study: LEV, propofol  Technical aspects: This EEG study was done with scalp electrodes positioned according to the 10-20 International system of electrode placement. Electrical activity was acquired at a sampling rate of 500Hz  and reviewed with a high frequency filter of 70Hz  and a low frequency filter of 1Hz . EEG data were recorded continuously and digitally stored.   Description: The posterior dominant rhythm consists of 8-9Hz  activity of moderate voltage (25-35 uV) seen predominantly in posterior head regions, symmetric and reactive to eye opening and eye closing. Sleep was characterized by vertex waves, sleep spindles (12 to 14 Hz), maximal frontocentral region.  EEG also showed continuous 3 to 5 Hz theta-delta slowing in right frontotemporal region which at times appeared sharply contoured with triphasic morphology, predominantly when patient is awake/stimulated.  Hyperventilation and photic stimulation were not performed.     ABNORMALITY - Continuous slow, right frontotemporal region  IMPRESSION: This study is suggestive of cortical dysfunction in right frontotemporal region likely secondary to underlying infarct.  No seizures or definite epileptiform discharges were seen throughout the recording.  Alaija Ruble 

## 2020-09-13 NOTE — Progress Notes (Signed)
Transitions of Care Team following this patient for discharge planning.  Currently, patient not medically stable; will continue to follow as patient progresses. Patient arrived from [facility] CSW left a voicemail for their admissions liaison.   Tashi Andujo LCSW

## 2020-09-13 NOTE — Progress Notes (Signed)
vLTM EEG complete. No skin Breakdown seen

## 2020-09-13 NOTE — Progress Notes (Signed)
SLP Cancellation Note  Patient Details Name: Andre Adams MRN: [id number] DOB: Jun 25, 1933   Cancelled treatment:       Reason Eval/Treat Not Completed: Patient not medically ready (remains on vent this am). Will f/u as able.     Mahala Menghini., M.A. CCC-SLP Acute Rehabilitation Services Pager [phone] Office [phone]  09/13/2020, 7:15 AM

## 2020-09-13 NOTE — Progress Notes (Signed)
Cortrak Tube Team Note:  Consult received to place a Cortrak feeding tube.   Cortrak tube unable to be advanced past the LES. Pt is not cooperative for repositioning. RD feels it is not safe to precede with further attempts to advance tube. Recommend fluoroscopy guided nasogastric tube placement if tube is still desired.    Betsey Holiday MS, RD, LDN Please refer to [facility] for RD and/or RD on-call/weekend/after hours pager

## 2020-09-13 NOTE — Progress Notes (Signed)
STROKE TEAM PROGRESS NOTE   INTERVAL HISTORY No family is at the bedside.  Pt still intubated, lethargic, eyes closed, barely open with voice, but able to following simple commands. Left UE and LE strength much improved from yesterday. MRI still showed large right MCA infarct, right M2 patent. LTM EEG no seizure.    OBJECTIVE Vitals:   09/13/20 0600 09/13/20 0748 09/13/20 0800 09/13/20 0900  BP: 119/72 (!) 124/58 116/71 123/90  Pulse: 98 92 92 (!) 101  Resp: Temp:   97.6 F (36.4 C)   TempSrc:   Axillary   SpO2: 100% 100% 100% 100%  Weight:      Height:        CBC:  Recent Labs  Lab 09/12/20 0417 09/12/20 0432  WBC 11.4*  --   HGB 13.3 13.3  HCT 40.7 39.0  MCV 98.8  --   PLT 134*  --     Basic Metabolic Panel:  Recent Labs  Lab 09/11/20 1800 09/11/20 1801 09/11/20 2238 09/12/20 0417 09/12/20 0432  NA 136 136   < > 136 139  K 4.4 4.2   < > 4.3 4.4  CL 103 105  --  108  --   CO2 21*  --   --  18*  --   GLUCOSE 279* 272*  --  247*  --   BUN 44* 44*  --  37*  --   CREATININE 2.57* 2.50*  --  2.27*  --   CALCIUM 9.1  --   --  8.0*  --    < > = values in this interval not displayed.    Lipid Panel:     Component Value Date/Time   CHOL 147 09/12/2020 0417   TRIG 92 09/13/2020 0540   HDL 27 (L) 09/12/2020 0417   CHOLHDL 5.4 09/12/2020 0417   VLDL 20 09/12/2020 0417   LDLCALC 100 (H) 09/12/2020 0417   HgbA1c: No results found for: HGBA1C Urine Drug Screen: No results found for: LABOPIA, COCAINSCRNUR, LABBENZ, AMPHETMU, THCU, LABBARB  Alcohol Level     Component Value Date/Time   ETH <10 09/11/2020 1845    IMAGING  MR ANGIO HEAD WO CONTRAST  Result Date: 09/12/2020 CLINICAL DATA:  Neuro deficit, acute, stroke suspected; Stroke, follow up. Status post right MCA thrombectomy. EXAM: MRI HEAD WITHOUT CONTRAST MRA HEAD WITHOUT CONTRAST TECHNIQUE: Multiplanar, multi-echo pulse sequences of the brain and surrounding structures were acquired without  intravenous contrast. Angiographic images of the Circle of Willis were acquired using MRA technique without intravenous contrast. COMPARISON:  Head CT 09/12/2020. Head and neck CTA and CTP 09/11/2020. FINDINGS: MRI HEAD FINDINGS Brain: There is a moderately large acute right MCA territory infarct involving much of the lateral temporal lobe, insula, and small portions of the frontal and parietal lobes including the operculum. There is associated petechial hemorrhage. There is mild cytotoxic edema without significant mass effect. T2 hyperintensities elsewhere in the cerebral white matter bilaterally are nonspecific but compatible with moderate chronic small vessel ischemic disease. There are small chronic infarcts in the left basal ganglia, left thalamus, and right greater than left cerebellum. There are chronic blood products associated with the left basal ganglia infarct, and there are also a few scattered chronic microhemorrhages in the cerebrum and as well as one in the cerebellum. Mild chronic small vessel changes are noted in the pons. There is mild to moderate cerebral atrophy. No mass, midline shift, or extra-axial fluid collection is identified. Vascular:  Arteries more fully evaluated below. Suspected slow flow in the right transverse and sigmoid sinuses. Skull and upper cervical spine: Unremarkable bone marrow signal. Sinuses/Orbits: Bilateral cataract extraction. Paranasal sinuses and mastoid air cells are clear. Other: None. MRA HEAD FINDINGS The study is moderately motion degraded. Anterior circulation: The internal carotid arteries are patent from skull base to carotid termini with detailed assessment of bilateral paraclinoid stenoses limited by motion. The ACAs are patent with a mild-to-moderate a left A1 stenosis again noted. The M1 segments are patent with motion artifact limiting detailed assessment of the distal right M1 segment and of bilateral MCA branch vessels, however the revascularized right  M2 superior division remains grossly patent. No aneurysm is identified. Posterior circulation: The visualized distal vertebral arteries are patent to the basilar with the left being mildly dominant. The basilar artery is patent with a mild stenosis in its midportion, less severe than the appearance on yesterday's CTA. The PCAs are patent with a severe stenosis again noted near the left P1-P2 junction. There is only evidence of mild narrowing near the right P1-P2 junction in contrast to the appearance severe stenosis on yesterday's CTA. No aneurysm is identified. Anatomic variants: None. IMPRESSION: 1. Moderately large acute right MCA territory infarct with petechial hemorrhage. 2. Moderate chronic small vessel ischemic disease with chronic lacunar infarcts as above. 3. Motion degraded head MRA as detailed above. The revascularized right M2 division remains grossly patent. Electronically Signed   By: Sebastian Ache M.D.   On: 09/12/2020 15:00   MR BRAIN WO CONTRAST  Result Date: 09/12/2020 CLINICAL DATA:  Neuro deficit, acute, stroke suspected; Stroke, follow up. Status post right MCA thrombectomy. EXAM: MRI HEAD WITHOUT CONTRAST MRA HEAD WITHOUT CONTRAST TECHNIQUE: Multiplanar, multi-echo pulse sequences of the brain and surrounding structures were acquired without intravenous contrast. Angiographic images of the Circle of Willis were acquired using MRA technique without intravenous contrast. COMPARISON:  Head CT 09/12/2020. Head and neck CTA and CTP 09/11/2020. FINDINGS: MRI HEAD FINDINGS Brain: There is a moderately large acute right MCA territory infarct involving much of the lateral temporal lobe, insula, and small portions of the frontal and parietal lobes including the operculum. There is associated petechial hemorrhage. There is mild cytotoxic edema without significant mass effect. T2 hyperintensities elsewhere in the cerebral white matter bilaterally are nonspecific but compatible with moderate chronic  small vessel ischemic disease. There are small chronic infarcts in the left basal ganglia, left thalamus, and right greater than left cerebellum. There are chronic blood products associated with the left basal ganglia infarct, and there are also a few scattered chronic microhemorrhages in the cerebrum and as well as one in the cerebellum. Mild chronic small vessel changes are noted in the pons. There is mild to moderate cerebral atrophy. No mass, midline shift, or extra-axial fluid collection is identified. Vascular: Arteries more fully evaluated below. Suspected slow flow in the right transverse and sigmoid sinuses. Skull and upper cervical spine: Unremarkable bone marrow signal. Sinuses/Orbits: Bilateral cataract extraction. Paranasal sinuses and mastoid air cells are clear. Other: None. MRA HEAD FINDINGS The study is moderately motion degraded. Anterior circulation: The internal carotid arteries are patent from skull base to carotid termini with detailed assessment of bilateral paraclinoid stenoses limited by motion. The ACAs are patent with a mild-to-moderate a left A1 stenosis again noted. The M1 segments are patent with motion artifact limiting detailed assessment of the distal right M1 segment and of bilateral MCA branch vessels, however the revascularized right M2 superior division remains  grossly patent. No aneurysm is identified. Posterior circulation: The visualized distal vertebral arteries are patent to the basilar with the left being mildly dominant. The basilar artery is patent with a mild stenosis in its midportion, less severe than the appearance on yesterday's CTA. The PCAs are patent with a severe stenosis again noted near the left P1-P2 junction. There is only evidence of mild narrowing near the right P1-P2 junction in contrast to the appearance severe stenosis on yesterday's CTA. No aneurysm is identified. Anatomic variants: None. IMPRESSION: 1. Moderately large acute right MCA territory infarct  with petechial hemorrhage. 2. Moderate chronic small vessel ischemic disease with chronic lacunar infarcts as above. 3. Motion degraded head MRA as detailed above. The revascularized right M2 division remains grossly patent. Electronically Signed   By: Sebastian Ache M.D.   On: 09/12/2020 15:00   DG Abd Portable 1V  Result Date: 09/12/2020 CLINICAL DATA:  Enteric tube placement EXAM: PORTABLE ABDOMEN - 1 VIEW COMPARISON:  September 12, 2020 FINDINGS: Incomplete assessment of majority of the abdomen. Enteric tube tip and side port project over the stomach. Scattered linear opacities, likely atelectasis. Coarse reticular opacities unchanged. Atherosclerotic calcifications of the aorta. Unchanged cardiomediastinal silhouette. ETT tip terminates 2.6 cm above the carina. Chronic compression fracture deformity at the thoracolumbar junction. IMPRESSION: Enteric tube tip and side port project over the stomach. Electronically Signed   By: Meda Klinefelter MD   On: 09/12/2020 14:39   EEG adult  Result Date: 09/12/2020 Rejeana Brock, MD     09/12/2020  7:33 PM History: R sided twitching Sedation: none Technique: This is a 21 channel routine scalp EEG performed at the bedside with bipolar and monopolar montages arranged in accordance to the international 10/20 system of electrode placement. One channel was dedicated to EKG recording. Background: There is a posterior.rhythm seen at times with a frequency of 8 to 9 Hz.  In addition, there is diffuse alpha and beta range activities consistent with patient's sedated state.  There is diffuse irregular delta and theta which is also consistent with patient's sedation. There are sharply contoured theta and alpha range activity over the temporal lobes which could be consistent with that with the rhythm, but there are also intermittent sharply contoured waves which appear to disrupt the background consistent with sharp waves.  These are maximal at  P7, 01, P3. Photic  stimulation: Physiologic driving is not performed EEG Abnormalities: 1) Left posterior quadrant epileptiform discharges Clinical Interpretation: This EEG is consistent with a left posterior quadrant potential area of epileptogenicity. There was no seizure recorded on this study. Ritta Slot, MD [facility] [phone] If 7pm- 7am, please page neurology on call as listed in AMION.   Overnight EEG with video  Result Date: 09/13/2020 Charlsie Quest, MD     09/13/2020  8:57 AM Patient Name: ONEAL SCHOENBERGER MRN: [id number] Epilepsy Attending: Charlsie Quest Referring Physician/Provider: Dr Marvel Plan Duration: 09/12/2020 1702 to 09/13/2020 0845 Patient history: 87 year old male with right MCA stroke noted to have right-sided twitching.  EEG to evaluate for seizures. Level of alertness: Awake, asleep AEDs during EEG study: LEV, propofol Technical aspects: This EEG study was done with scalp electrodes positioned according to the 10-20 International system of electrode placement. Electrical activity was acquired at a sampling rate of 500Hz  and reviewed with a high frequency filter of 70Hz  and a low frequency filter of 1Hz . EEG data were recorded continuously and digitally stored. Description: The posterior dominant rhythm consists of 8-9Hz  activity  of moderate voltage (25-35 uV) seen predominantly in posterior head regions, symmetric and reactive to eye opening and eye closing. Sleep was characterized by vertex waves, sleep spindles (12 to 14 Hz), maximal frontocentral region.  EEG also showed continuous 3 to 5 Hz theta-delta slowing in right frontotemporal region which at times appeared sharply contoured with triphasic morphology, predominantly when patient is awake/stimulated.  Hyperventilation and photic stimulation were not performed.   ABNORMALITY - Continuous slow, right frontotemporal region IMPRESSION: This study is suggestive of cortical dysfunction in right frontotemporal region likely  secondary to underlying infarct.  No seizures or definite epileptiform discharges were seen throughout the recording. Charlsie Quest   ECHOCARDIOGRAM COMPLETE  Result Date: 09/12/2020    ECHOCARDIOGRAM REPORT   Patient Name:   RODGERICK GILLIAND Date of Exam: 09/12/2020 Medical Rec #:  [id number]        Height:       67.0 in Accession #:    [id number]       Weight:       150.0 lb Date of Birth:  [birth year]       BSA:          1.790 m Patient Age:    86 years         BP:           111/65 mmHg Patient Gender: M                HR:           81 bpm. Exam Location:  Inpatient Procedure: 2D Echo, Cardiac Doppler and Color Doppler Indications:    Stroke  History:        Patient has no prior history of Echocardiogram examinations.  Sonographer:    Eulah Pont RDCS Referring Phys: [id number] Malachi Carl STACK IMPRESSIONS  1. Left ventricular ejection fraction, by estimation, is 65 to 70%. The left ventricle has normal function. The left ventricle has no regional wall motion abnormalities. Left ventricular diastolic parameters are indeterminate.  2. RV-RA gradient normal at 23 mmHg. Right ventricular systolic function is normal. The right ventricular size is normal.  3. Left atrial size was severely dilated.  4. The mitral valve is degenerative. Mild mitral valve regurgitation.  5. The aortic valve is tricuspid. There is moderate calcification of the aortic valve. Aortic valve regurgitation is not visualized. Moderate aortic valve stenosis. Aortic valve mean gradient measures 11.0 mmHg. Dimenionless index 0.41.  6. Aortic dilatation noted. There is borderline dilatation of the aortic root, measuring 39 mm.  7. Unable to estimate CVP. FINDINGS  Left Ventricle: Left ventricular ejection fraction, by estimation, is 65 to 70%. The left ventricle has normal function. The left ventricle has no regional wall motion abnormalities. The left ventricular internal cavity size was normal in size. There is  borderline left ventricular  hypertrophy. Left ventricular diastolic parameters are indeterminate. Right Ventricle: RV-RA gradient normal at 23 mmHg. The right ventricular size is normal. No increase in right ventricular wall thickness. Right ventricular systolic function is normal. Left Atrium: Left atrial size was severely dilated. Right Atrium: Right atrial size was normal in size. Pericardium: There is no evidence of pericardial effusion. Mitral Valve: The mitral valve is degenerative in appearance. There is mild thickening of the mitral valve leaflet(s). There is mild calcification of the mitral valve leaflet(s). Mild mitral annular calcification. Mild mitral valve regurgitation. Tricuspid Valve: The tricuspid valve is grossly normal. Tricuspid valve regurgitation is mild. Aortic Valve: The aortic valve  is tricuspid. There is moderate calcification of the aortic valve. There is mild aortic valve annular calcification. Aortic valve regurgitation is not visualized. Moderate aortic stenosis is present. Aortic valve mean gradient measures 11.0 mmHg. Aortic valve peak gradient measures 16.9 mmHg. Aortic valve area, by VTI measures 1.42 cm. Pulmonic Valve: The pulmonic valve was grossly normal. Pulmonic valve regurgitation is mild. Aorta: Aortic dilatation noted. There is borderline dilatation of the aortic root, measuring 39 mm. Venous: Unable to estimate CVP. The inferior vena cava was not well visualized. IAS/Shunts: No atrial level shunt detected by color flow Doppler.  LEFT VENTRICLE PLAX 2D LVIDd:         3.20 cm LVIDs:         2.60 cm LV PW:         1.00 cm LV IVS:        1.00 cm LVOT diam:     2.10 cm LV SV:         54 LV SV Index:   30 LVOT Area:     3.46 cm  RIGHT VENTRICLE TAPSE (M-mode): 1.5 cm LEFT ATRIUM             Index       RIGHT ATRIUM           Index LA diam:        3.90 cm 2.18 cm/m  RA Area:     11.90 cm LA Vol (A2C):   82.1 ml 45.88 ml/m RA Volume:   25.10 ml  14.03 ml/m LA Vol (A4C):   87.0 ml 48.62 ml/m LA Biplane  Vol: 89.1 ml 49.79 ml/m  AORTIC VALVE AV Area (Vmax):    1.34 cm AV Area (Vmean):   1.30 cm AV Area (VTI):     1.42 cm AV Vmax:           205.50 cm/s AV Vmean:          143.750 cm/s AV VTI:            0.383 m AV Peak Grad:      16.9 mmHg AV Mean Grad:      11.0 mmHg LVOT Vmax:         79.43 cm/s LVOT Vmean:        53.833 cm/s LVOT VTI:          0.157 m LVOT/AV VTI ratio: 0.41  AORTA Ao Root diam: 3.90 cm Ao Asc diam:  3.40 cm TRICUSPID VALVE TR Peak grad:   23.2 mmHg TR Vmax:        241.00 cm/s  SHUNTS Systemic VTI:  0.16 m Systemic Diam: 2.10 cm Nona Dell MD Electronically signed by Nona Dell MD Signature Date/Time: 09/12/2020/3:37:07 PM    Final      DG Wrist Complete Left  Result Date: 09/11/2020 CLINICAL DATA:  fall EXAM: LEFT HAND - COMPLETE 3+ VIEW; LEFT WRIST - COMPLETE 3+ VIEW COMPARISON:  December 15, 2019 common March 13, 2020 FINDINGS: Revisualization of the sequela of an impacted fracture of the distal LEFT radius and ulna. There is mature osseous bridging with scattered residual areas of lucency from prior fracture sites. Fracture fragments are in unchanged alignment. No definitive acute fracture. Vascular calcifications. Osteopenia. Degenerative changes throughout the DIPs and PIPs. IMPRESSION: Revisualization of sequela of prior impacted fracture of the distal radius and ulna. Evaluation for superimposed acute fracture is limited due to osteopenia and underlying chronic osseous remodeling. No definitive superimposed acute fracture is noted. If persistent  clinical concern for scaphoid fracture, recommend immobilization and follow-up radiographs in 2 weeks versus MRI. Electronically Signed   By: Meda Klinefelter MD   On: 09/11/2020 19:17   CT HEAD WO CONTRAST  Result Date: 09/12/2020 CLINICAL DATA:  Status post thrombectomy EXAM: CT HEAD WITHOUT CONTRAST TECHNIQUE: Contiguous axial images were obtained from the base of the skull through the vertex without intravenous contrast.  COMPARISON:  None. FINDINGS: Brain: There is hyperdensity over the right MCA territory, likely contrast staining. There is generalized atrophy without lobar predilection. There is periventricular hypoattenuation compatible with chronic microvascular disease. Vascular: No abnormal hyperdensity of the major intracranial arteries or dural venous sinuses. No intracranial atherosclerosis. Skull: The visualized skull base, calvarium and extracranial soft tissues are normal. Sinuses/Orbits: No fluid levels or advanced mucosal thickening of the visualized paranasal sinuses. No mastoid or middle ear effusion. The orbits are normal. IMPRESSION: Hyperdensity over the right MCA territory, likely contrast staining. Electronically Signed   By: Deatra Robinson M.D.   On: 09/12/2020 02:55   CT Cervical Spine Wo Contrast  Result Date: 09/11/2020 CLINICAL DATA:  Facial trauma. EXAM: CT CERVICAL SPINE WITHOUT CONTRAST TECHNIQUE: Multidetector CT imaging of the cervical spine was performed without intravenous contrast. Multiplanar CT image reconstructions were also generated. COMPARISON:  None. FINDINGS: Alignment: No evidence of acute traumatic subluxation. Skull base and vertebrae: No acute cervical spine fracture or suspicious osseous lesion. Mild superior endplate compression fracture at T3, likely chronic. Soft tissues and spinal canal: No prevertebral fluid or swelling. No visible canal hematoma. Disc levels: Moderate to severe disc space narrowing from C4-5 to C6-7. Severe bilateral facet arthrosis at C3-4 with small subchondral cysts or erosions and joint widening on the right. Right facet ankylosis at C4-5. No evidence of high-grade spinal stenosis or high-grade neural foraminal stenosis. Upper chest: No apical lung consolidation or mass. Other: Subcentimeter thyroid nodules for which no imaging follow-up is recommended. Carotid atherosclerosis. IMPRESSION: 1. No acute cervical spine fracture. 2. Advanced cervical disc and  facet degeneration. Electronically Signed   By: Sebastian Ache M.D.   On: 09/11/2020 19:01   DG Pelvis Portable  Result Date: 09/11/2020 CLINICAL DATA:  trauma EXAM: PORTABLE PELVIS 1-2 VIEWS COMPARISON:  December 15, 2019 FINDINGS: Osteopenia. Excreted contrast limits evaluation of the sacrum. No acute displaced fracture seen on single view. No pelvic diastasis. Vascular calcifications. Degenerative changes of the lumbar spine. IMPRESSION: No acute fracture on single view. If persistent concern for nondisplaced hip or pelvic fracture, recommend dedicated pelvic MRI. Electronically Signed   By: Meda Klinefelter MD   On: 09/11/2020 19:13   Portable Chest x-ray  Result Date: 09/11/2020 CLINICAL DATA:  87 year old male status post intubation. EXAM: PORTABLE CHEST 1 VIEW COMPARISON:  Earlier radiograph dated 09/11/2020. FINDINGS: Interval placement of an endotracheal tube with tip approximately 6 cm above the carina. Enteric tube with side-port in the distal esophagus and tip close to the GE junction. Recommend further advancing of the enteric tube by at least additional 10 cm. Diffuse interstitial coarsening and left lung base atelectasis similar to prior radiograph. Stable cardiomediastinal silhouette. Atherosclerotic calcification of the aorta. No acute osseous pathology. IMPRESSION: 1. Endotracheal tube above the carina. 2. Enteric tube with side-port in the distal esophagus and tip close to the GE junction. Recommend further advancing of the enteric tube by at least 10 cm. Electronically Signed   By: Elgie Collard M.D.   On: 09/11/2020 23:08   DG Chest Portable 1 View  Result Date:  09/11/2020 CLINICAL DATA:  trauma EXAM: PORTABLE CHEST 1 VIEW COMPARISON:  October 23, 2019, October 03, 2019 FINDINGS: Evaluation is limited secondary to patient rotation. The cardiomediastinal silhouette is grossly unchanged in contour. No pleural effusion. No pneumothorax. Diffuse interstitial prominence. Visualized  abdomen is unremarkable. Compression fracture deformity of the T11, unchanged in comparison to prior. Compression fracture of T9 appears similar comparison to prior. IMPRESSION: Diffuse interstitial prominence likely reflecting underlying pulmonary edema. Differential considerations include atypical infection. Electronically Signed   By: Meda Klinefelter MD   On: 09/11/2020 19:12   DG Hand Complete Left  Result Date: 09/11/2020 CLINICAL DATA:  fall EXAM: LEFT HAND - COMPLETE 3+ VIEW; LEFT WRIST - COMPLETE 3+ VIEW COMPARISON:  December 15, 2019 common March 13, 2020 FINDINGS: Revisualization of the sequela of an impacted fracture of the distal LEFT radius and ulna. There is mature osseous bridging with scattered residual areas of lucency from prior fracture sites. Fracture fragments are in unchanged alignment. No definitive acute fracture. Vascular calcifications. Osteopenia. Degenerative changes throughout the DIPs and PIPs. IMPRESSION: Revisualization of sequela of prior impacted fracture of the distal radius and ulna. Evaluation for superimposed acute fracture is limited due to osteopenia and underlying chronic osseous remodeling. No definitive superimposed acute fracture is noted. If persistent clinical concern for scaphoid fracture, recommend immobilization and follow-up radiographs in 2 weeks versus MRI. Electronically Signed   By: Meda Klinefelter MD   On: 09/11/2020 19:17   CT HEAD CODE STROKE WO CONTRAST  Result Date: 09/11/2020 CLINICAL DATA:  Code stroke. Neuro deficit, acute, stroke suspected. EXAM: CT HEAD WITHOUT CONTRAST TECHNIQUE: Contiguous axial images were obtained from the base of the skull through the vertex without intravenous contrast. COMPARISON:  09/27/2019 FINDINGS: Brain: There is no evidence of an acute infarct, intracranial hemorrhage, mass, midline shift, or extra-axial fluid collection. There is mild-to-moderate cerebral atrophy. Patchy hypodensities in the cerebral white  matter bilaterally are similar to the prior motion degraded CT and are nonspecific but compatible with moderate chronic small vessel ischemic disease. A chronic infarct is again noted in the left basal ganglia. There is also a small chronic right cerebellar infarct. Vascular: Calcified atherosclerosis at the skull base. No hyperdense vessel. Skull: No fracture or suspicious osseous lesion. Sinuses/Orbits: Paranasal sinuses and mastoid air cells are clear. Bilateral cataract extraction. Other: None. ASPECTS [facility] Stroke Program Early CT Score) - Ganglionic level infarction (caudate, lentiform nuclei, internal capsule, insula, M1-M3 cortex): 7 - Supraganglionic infarction (M4-M6 cortex): 3 Total score (0-10 with 10 being normal): 10 IMPRESSION: 1. No evidence of acute intracranial abnormality. 2. ASPECTS is 10. 3. Moderate chronic small vessel ischemic disease. These results were communicated to Dr. Selina Cooley at 6:21 pm on 09/11/2020 by text page via the [facility] messaging system. Electronically Signed   By: Sebastian Ache M.D.   On: 09/11/2020 18:23   CT ANGIO HEAD NECK W WO CM W PERF (CODE STROKE)  Result Date: 09/11/2020 CLINICAL DATA:  Neuro deficit, acute, stroke suspected. EXAM: CT ANGIOGRAPHY HEAD AND NECK CT PERFUSION BRAIN TECHNIQUE: Multidetector CT imaging of the head and neck was performed using the standard protocol during bolus administration of intravenous contrast. Multiplanar CT image reconstructions and MIPs were obtained to evaluate the vascular anatomy. Carotid stenosis measurements (when applicable) are obtained utilizing NASCET criteria, using the distal internal carotid diameter as the denominator. Multiphase CT imaging of the brain was performed following IV bolus contrast injection. Subsequent parametric perfusion maps were calculated using RAPID software. CONTRAST:  26mL  OMNIPAQUE IOHEXOL 350 MG/ML SOLN COMPARISON:  None. FINDINGS: CTA NECK FINDINGS Aortic arch: Normal variant aortic arch  branching pattern with common origin of the brachiocephalic and left common carotid arteries. Mild atherosclerotic plaque without arch vessel origin stenosis. Right carotid system: Patent with predominantly calcified plaque at the carotid bifurcation. No evidence of a significant stenosis or dissection. Left carotid system: Patent with mixed calcified and soft plaque at the carotid bifurcation. No evidence of a significant stenosis or dissection. Vertebral arteries: Patent with the left being mildly dominant. Scattered atherosclerosis bilaterally resulting in severe proximal V1 and mild V3 stenoses on the left. Skeleton: See separate cervical spine CT report. Other neck: Subcentimeter thyroid nodules for which no imaging follow-up is recommended. Upper chest: Mild motion artifact. No apical lung consolidation or mass. Review of the MIP images confirms the above findings CTA HEAD FINDINGS Anterior circulation: The internal carotid arteries are patent from skull base to carotid termini with atherosclerotic plaque resulting in moderate bilateral paraclinoid stenoses. The right M1 segment is patent, however there is a proximal M2 occlusion without significant distal reconstitution. The left MCA is patent with a mild M1 stenosis noted. The ACAs are patent with mild-to-moderate stenosis of the left A1 segment as well as moderate irregular narrowing of both A2 segments. No aneurysm is identified. Posterior circulation: The intracranial vertebral arteries are patent to the basilar with atherosclerosis resulting in irregularity bilaterally and a moderate stenosis on the right. The basilar artery is patent with a severe stenosis in its midportion. The right PCA is patent with a severe stenosis near the P1-P2 junction. There are severe stenoses of both PCAs near the P1-P2 junctions with poor flow distally, particularly on the left. No aneurysm is identified. Venous sinuses: As permitted by contrast timing, patent. Anatomic  variants: None. Review of the MIP images confirms the above findings CT Brain Perfusion Findings: ASPECTS: 10 CBF (<30%) Volume: 40 mL Perfusion (Tmax>6.0s) volume: 184 mL Mismatch Volume: 144 mL Infarction Location: Right frontal and temporal lobes primarily at the level of the operculum (MCA territory) IMPRESSION: 1. Proximal right M2 occlusion. 2. CTP demonstrates a right MCA core infarct with large penumbra as detailed above. 3. Advanced intracranial atherosclerosis including moderate bilateral ICA stenoses, a severe basilar artery stenosis, and severe bilateral proximal PCA stenoses. 4. Cervical carotid atherosclerosis without significant stenosis. 5. Severe proximal left vertebral artery stenosis. Emergent findings were communicated to Dr. Selina Cooley at 6:35 pm on 09/11/2020 by text page via the [facility] messaging system. Electronically Signed   By: Sebastian Ache M.D.   On: 09/11/2020 18:51   CT Maxillofacial Wo Contrast  Result Date: 09/11/2020 CLINICAL DATA:  Facial trauma. EXAM: CT MAXILLOFACIAL WITHOUT CONTRAST TECHNIQUE: Multidetector CT imaging of the maxillofacial structures was performed. Multiplanar CT image reconstructions were also generated. COMPARISON:  Head CT 09/27/2019 FINDINGS: Osseous: No acute fracture, mandibular dislocation, or destructive osseous process. Orbits: Bilateral cataract extraction and proptosis. No orbital hematoma or mass. Sinuses: Paranasal sinuses and mastoid air cells are clear. Mild rightward nasal septal deviation. Soft tissues: Punctate left parotid calcification.  Atherosclerosis. Limited intracranial: More fully evaluated on separate head CT. IMPRESSION: No acute maxillofacial fracture. Electronically Signed   By: Sebastian Ache M.D.   On: 09/11/2020 18:55    ECG - atrial fibrillation - ventricular response 94 BPM (See cardiology reading for complete details)  PHYSICAL EXAM  Temp:  [97.6 F (36.4 C)-99.1 F (37.3 C)] 97.6 F (36.4 C) (07/25 0800) Pulse Rate:   [56-113] 101 (07/25  0900) Resp:  [12-22] 19 (07/25 0900) BP: (100-142)/(47-117) 123/90 (07/25 0900) SpO2:  [98 %-100 %] 100 % (07/25 0900) Arterial Line BP: (105-154)/(55-72) 144/71 (07/25 0900) FiO2 (%):  [30 %-40 %] 30 % (07/25 0748)  General - Well nourished, well developed, intubated off sedation.  Ophthalmologic - fundi not visualized due to noncooperation.  Cardiovascular - irregularly irregular heart rate and rhythm.  Neuro - intubated off sedation, eyes closed, barely open with voice, able to follow simple commands on the right hand and b/l feet. With forced eye opening, eyes in mid position, chronic left eye propotosis, not blinking to visual threat, doll's eyes absent, not tracking, PERRL. Corneal reflex not cooperative with left hand trying to remove my hands but seems corneal reflexes are absent, gag and cough present. Breathing over the vent.  Facial symmetry not able to test due to ET tube.  Tongue protrusion not cooperative. BUEs and BLEs spontaneous movement, BUE against gravity at least 3+/5, BLEs able to withdraw on pain stimulation 2+/5, not against gravity. No babinski. Sensation, coordination and gait not tested.   ASSESSMENT/PLAN Mr. GHALI MORISSETTE is a 87 y.o. male with history of DM2, CKD stage 3a, HTN, HL, hx a fib off AC 2/2 hx GIB, mild cognitive impairment and frequent falls who presented after a fall, non verbal and L sided weakness at ALF. He did not receive IV t-PA due to head trauma with fall. The pt was taken to IR for intervention for thrombectomy Rt M2 lesion.  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 CT Head - No evidence of acute intracranial abnormality.  CTA H&N - Proximal right M2 occlusion. Advanced intracranial atherosclerosis including moderate bilateral ICA stenoses, a severe basilar artery stenosis, and severe bilateral proximal PCA stenoses.  CTP  40/144 cc, positive for penumbra MRI brain - right MCA large infarct with petechial hemorrhage MRA head - patent right M2 2D Echo EF 65-70% LDL - 100 HgbA1c - pending VTE prophylaxis -Lovenox No antithrombotics prior to admission, now on aspirin 325. Ongoing aggressive stroke risk factor management Therapy recommendations:  pending Disposition:  Pending  Chronic Afib Was on Coumadin in the past Coumadin discontinued in 10/2019 due to GI bleeding Follow-up with cardiology, wife and pt declined watchman device  Now on ASA Will consider AC with eliquis in 7 to 10 days post stroke (see below)  Hx of GIB Per discharge in 10/2019 "Of note, patient had recent GI bleed last month with no source of bleeding found on colonoscopy although did have a polypack to me for multiple submucosal polyps. Was plan for further polyp removal outpatient but has not done so yet. Patient had episode of large-volume hematochezia on 10/26/2019. GI was consulted and originally reccommended observation. Serial CBCs were obtained with downtrending Hgbs, GI performed colonoscopy on 10/29/19 which revealed large fecal impaction with underlying deep ulcers in the rectum (nonbleeding) consistent with stercoral ulcers. This was thought to be the cause of the bleeding. Large borders solid stool was removed in combination with digital disimpaction, irrigation, and snare and rat-tooth forceps. Patient received Fleet enema x1 and increase MiraLAX daily 34 mg with continuing daily fiber supplementation. Important to maintain a daily aggressive bowel regimen ondischarge. Do not feel above GI findings are contraindicated for [facility], especially now pt had stroke due to not on [facility] Will consider eliquis in 7-10 days post stroke Continue ASA for now  Respiratory failure  Intubated for procedure, still intubated post procedure due to mental status and not able to protect airway CCM on board Off sedation, on weaning now Extubate as able  ?  Seizure-like activity Post procedure twitching of right arm and foot noted NOT fit to pt right sided infarct Pt loaded with Keppra x 1, on keppra 500mg  bid LTM EEG no seizure Will d/c LTM  Hypertension Stable at the low end Off IVF On metoprolol and doxazosin Long-term BP goal normotensive  Hyperlipidemia Home Lipid lowering medication: unknown LDL 100, goal < 70 Current lipid lowering medication: lipitor 40 Continue statin at discharge  Diabetes Home diabetic meds: unknown Hyperglycemia On lantus SSI  CBG monitoring HgbA1c - pending, goal < 7.0 PCP close follow up   Other Stroke Risk Factors Advanced age  Other Active Problems Code status - Full code CKD - stage 3b - Cre - 2.5->2.27 Chronic fractures related to fall - hip, pelvic and LEFT radius and ulna fxs  Mild leukocytosis - WBCs - 11.4 (afebrile) Frequent falls   Hospital day # 2  This patient is critically ill due to large right MCA infarct, afib with RVR, s/p IR, seizure like activity, respiratory failure, hyperglycemia and at significant risk of neurological worsening, death from recurrent stroke, hemorrhagic conversion, status epilepticus, heart failure, sepsis, DKA. This patient's care requires constant monitoring of vital signs, hemodynamics, respiratory and cardiac monitoring, review of multiple databases, neurological assessment, discussion with family, other specialists and medical decision making of high complexity. I spent 45 minutes of neurocritical care time in the care of this patient. I had long discussion with wife at bedside, updated pt current condition, treatment plan and potential prognosis, and answered all the questions. She expressed understanding and appreciation.    Marvel PlanJindong Ginamarie Banfield, MD PhD Stroke Neurology 09/13/2020 9:42 AM    To contact Stroke Continuity provider, please refer to [email or web address]. After hours, contact General Neurology

## 2020-09-13 NOTE — Progress Notes (Signed)
NAME:  Andre Adams, MRN:  [id number], DOB:  [birth year], LOS: 2 ADMISSION DATE:  09/11/2020, CONSULTATION DATE:  7/23 REFERRING MD:  Selina Cooley MD, CHIEF COMPLAINT:   fall and left sided weakness   Brief HPI:   Goes by Andre Adams"  59 yoM with prior hx of HTN, HL, Afib off AC due to GIB HX, CKD3, frequent falls presented 7/23 form [facility] nursing home with left sided weakness with flaccid L dide arm/face/leg and was nonverbal.  LKW 1445. NIHSS score 23. TPA was not give due to fall with head trauma. CTA head showed R MCA stroke secondary to a R M2 occlusion. He was taken to [facility] with Dr. Corliss Skains.  Returned to ICU intubated, PCCM following.   Pertinent  Medical History  HTN, HL, Afib off AC due to GIB HX, CKD3, frequent falls  Significant Hospital Events: Including procedures, antibiotic start and stop dates in addition to other pertinent events   7/23 Admitted with acute R M2 occlusion s/p NIR 7/24 loaded with keppra for R sided twitching/ EEG, MAE to noxious stimuli, followed LE command for RN; off cleviprex gtt early am; weaned all day PSV 5/5, mental status barrier to extuation   7/23 SARS/ flu > neg 7/23 MRSA > neg 7/24 trach asp >>  7/23 ancef (preop)  Interim History / Subjective:  Weaned all day PSV 5/5 7/24, however mental status barrier to extubation Remains on very low dose propofol 5 mcg/kg/min- turned off this morning, not currently needed sedation, intermittent agitation with stimulation.  Weaning PSV 5/5 UOP 720 ml/ 24 hr (0.4 ml/kg/hr) +1.4L/ net +4.5L  (likely inaccurate given prior unmeasured occurrences)  Afebrile   Objective   Blood pressure 119/72, pulse 98, temperature 98.2 F (36.8 C), temperature source Oral, resp. rate 17, height 5\' 7"  (1.702 m), weight 68 kg, SpO2 100 %.    Vent Mode: PRVC FiO2 (%):  [30 %-40 %] 30 % Set Rate:  [16 bmp] 16 bmp Vt Set:  [520 mL] 520 mL PEEP:  [5 cmH20] 5 cmH20 Pressure Support:  [5 cmH20-8 cmH20] 5  cmH20 Plateau Pressure:  [14 cmH20] 14 cmH20   Intake/Output Summary (Last 24 hours) at 09/13/2020 0748 Last data filed at 09/13/2020 09/15/2020 Gross per 24 hour  Intake 2130.92 ml  Output 720 ml  Net 1410.92 ml   Filed Weights   09/11/20 1830  Weight: 68 kg   Examination: General:  Elderly frail appearing male comfortable on MV/ intubated HEENT: MM pink/moist, drooling out of left corner of mouth, will not open eyes, pupils 5/reactive, anicteric, some exophthalmos L> R, weak corneals, ETT/ OGT Neuro: will not open eyes to verbal/ noxious stimuli, is purposeful in both upper extremities against gravity, will follow command in RUE/ RLE, withdrawals in LLE/ intermittently able to wiggle toes, some shakiness in RUE when provoked, no clear seizure activity  CV: afib, ir ir, no obvious murmur, right groin site with no palpable hematoma, some ecchymosis  PULM:  non labored, doing well on PSV, minimal clear/ white secretions, CTA GI: soft, bs+, ND/ NT, condom cath Extremities: warm/dry, no LE edema, left wrist remains in ace bandage Skin: no rashes    Resolved Hospital Problem list     Assessment & Plan:  R MCA stroke secondary to a R M2 occlusion S/P thrombectomy - Neuro remains primary  - likely related to Afib off AC.  Continue ASA per Neurology.  Will likely be placed on DOAC (given prior GIB 10/2019 not life threatening  and at this time, risk of falls/ etc outweigh risk off AC- defer to Neurology  - MRI/ TTE/ EEG as below - defer AEDs/ keppra to neurology, spot EEG today - continue seizure precautions - remains normotensive, SBP goal < 160.  Dc Aline today  - statin  - ongoing PT/ OT as able   - 7/24 EEG showing left posterior qaudrant epileptiform discharges but no seizure recorded   - 7/24 TTE >> LVEF 65-70%, no WMA, indeterminate diastolic, mild MVR/degenerative, mod AS/ calcification, borderline aortic root dilation   -7/24 MRI/ MRA brain >  1. Moderately large acute right MCA  territory infarct with petechial hemorrhage. 2. Moderate chronic small vessel ischemic disease with chronic lacunar infarcts as above. 3. Motion degraded head MRA as detailed above. The revascularized right M2 division remains grossly patent  Acute respiratory insufficiency related to above  - Continue MV support, 8cc/kg IBW with goal Pplat <30 and DP<15  -VAP prevention protocol/ PPI -PAD protocol for sedation> MINIMIZE, pt reportedly very sensitive to sedation, d/c propofol, low dose precedex as need, minimize prn fentanyl, RASS goal 0 -wean FiO2 as able for SpO2 >92%  - doing well on PSV however mental status and copious oral secretions remains a barrier to extubation; see below for family discussions/ establishing GOCs has given his large stroke, he would be very high risk for reintubation for airway protection and probable aspiration   Concern for seizure: keppra loaded overnight, some diffuse twitching reported after stimulation. - continue EEG as above per neurology   DM2 - SSI, is within goal   HX Atrial fibrillation off AC due to GIB hx Afib on EKG -Rate currently controlled - adding half dose home lopressor with holding parameters  -AC per neurology  CKD3 on AKI Creat 2.50 on admission. Unclear baseline. Improved with fluids - pending BMET/ mag today  - monitor UOP/ urinary retention/ bladder scan prn  - restart home flomax - Trend BMP / urinary output - Replace electrolytes as indicated - Avoid nephrotoxic agents, ensure adequate renal perfusion   Best Practice (right click and "Reselect all SmartList Selections" daily)   Diet/type: NPO; TF  DVT prophylaxis: SCD; lovenox (monitor renal function closely)  GI prophylaxis: PPI Lines: N/A Foley:  N/A; monitor for urinary retention  Code Status:  full code Last date of multidisciplinary goals of care discussion: Patient's wife updated at bedside this morning, Dr. Roda Shutters also at bedside for discussion.  I did relay my  concerns with extubation, air way protection, high risk for aspiration and reintubation at this time.  We did approach GOCs.  Patient has a living will (from 39) being scanned on the chart, but wife needs some time to think and discuss with their children, but most likely, she does not want reintubation when we extubate.  Given his neuro exam has improved some over the last 24 hours, but yet not quite there, and we are trying to minimize his sedation given his reported sensitivities to optimize his best chance for likely a one-way extubation.  Will defer extubation today and hopefully he will continue to improve his mental status and chances for a successful extubation.   Labs   CBC: Recent Labs  Lab 09/11/20 1801 09/11/20 2238 09/12/20 0417 09/12/20 0432  WBC  --   --  11.4*  --   HGB 17.0 13.3 13.3 13.3  HCT 50.0 39.0 40.7 39.0  MCV  --   --  98.8  --   PLT  --   --  134*  --     Basic Metabolic Panel: Recent Labs  Lab 09/11/20 1800 09/11/20 1801 09/11/20 2238 09/12/20 0417 09/12/20 0432  NA 136 136 139 136 139  K 4.4 4.2 4.2 4.3 4.4  CL 103 105  --  108  --   CO2 21*  --   --  18*  --   GLUCOSE 279* 272*  --  247*  --   BUN 44* 44*  --  37*  --   CREATININE 2.57* 2.50*  --  2.27*  --   CALCIUM 9.1  --   --  8.0*  --    GFR: Estimated Creatinine Clearance: 22.6 mL/min (A) (by C-G formula based on SCr of 2.27 mg/dL (H)). Recent Labs  Lab 09/11/20 1845 09/12/20 0417  WBC  --  11.4*  LATICACIDVEN 3.0* 3.1*    Liver Function Tests: Recent Labs  Lab 09/11/20 1800  AST 18  ALT 10  ALKPHOS 87  BILITOT 0.9  PROT 6.3*  ALBUMIN 3.2*   No results for input(s): LIPASE, AMYLASE in the last 168 hours. No results for input(s): AMMONIA in the last 168 hours.  ABG    Component Value Date/Time   PHART 7.345 (L) 09/12/2020 0432   PCO2ART 32.4 09/12/2020 0432   PO2ART 113 (H) 09/12/2020 0432   HCO3 17.8 (L) 09/12/2020 0432   TCO2 19 (L) 09/12/2020 0432   ACIDBASEDEF  7.0 (H) 09/12/2020 0432   O2SAT 98.0 09/12/2020 0432     Coagulation Profile: Recent Labs  Lab 09/11/20 1800  INR 1.1    Cardiac Enzymes: No results for input(s): CKTOTAL, CKMB, CKMBINDEX, TROPONINI in the last 168 hours.  HbA1C: No results found for: HGBA1C  CBG: Recent Labs  Lab 09/12/20 1552 09/12/20 1924 09/12/20 2320 09/13/20 0419 09/13/20 0737  GLUCAP 161* 144* 163* 201* 157*    Critical care time: 40 mins       Posey Boyer, ACNP [facility] 09/13/2020, 7:49 AM  See Amion for pager If no response to pager, please call PCCM consult pager After 7:00 pm call Elink

## 2020-09-13 NOTE — Progress Notes (Signed)
PT Cancellation Note  Patient Details Name: Andre Adams MRN: [id number] DOB: Apr 29, 1933   Cancelled Treatment:    Reason Eval/Treat Not Completed: Active bedrest order (remains intubated, sedated).  Lillia Pauls, PT, DPT Acute Rehabilitation Services Pager [phone] Office [phone]    Norval Morton 09/13/2020, 7:51 AM

## 2020-09-13 NOTE — Progress Notes (Signed)
\  T Cancellation Note  Patient Details Name: Andre Adams MRN: [id number] DOB: 11-Dec-1933   Cancelled Treatment:    Reason Eval/Treat Not Completed: Patient not medically ready (intubtaed sedated BR) OT order received and appreciated however this conflicts with current bedrest order set. Please increase activity tolerance as appropriate and remove bedrest from orders. . Please contact OT at [phone] if bed rest order is discontinued. OT will hold evaluation at this time and will check back as time allows pending increased activity orders.   Wynona Neat, OTR/L  Acute Rehabilitation Services Pager: [phone] Office: [phone] .  09/13/2020, 8:00 AM

## 2020-09-13 NOTE — Progress Notes (Signed)
RT NOTE: patient placed on CPAP/PSV of 12/5 at 0750.  Currently tolerating well.  Will continue to monitor.

## 2020-09-14 ENCOUNTER — Inpatient Hospital Stay (HOSPITAL_COMMUNITY): Payer: HMO

## 2020-09-14 DIAGNOSIS — I639 Cerebral infarction, unspecified: Secondary | ICD-10-CM | POA: Diagnosis not present

## 2020-09-14 DIAGNOSIS — I482 Chronic atrial fibrillation, unspecified: Secondary | ICD-10-CM | POA: Diagnosis not present

## 2020-09-14 DIAGNOSIS — I63511 Cerebral infarction due to unspecified occlusion or stenosis of right middle cerebral artery: Secondary | ICD-10-CM | POA: Diagnosis not present

## 2020-09-14 DIAGNOSIS — N179 Acute kidney failure, unspecified: Secondary | ICD-10-CM | POA: Diagnosis not present

## 2020-09-14 DIAGNOSIS — J9601 Acute respiratory failure with hypoxia: Secondary | ICD-10-CM

## 2020-09-14 LAB — CBC
HCT: 37.7 % — ABNORMAL LOW (ref 39.0–52.0)
Hemoglobin: 12.3 g/dL — ABNORMAL LOW (ref 13.0–17.0)
MCH: 32.1 pg (ref 26.0–34.0)
MCHC: 32.6 g/dL (ref 30.0–36.0)
MCV: 98.4 fL (ref 80.0–100.0)
Platelets: 121 10*3/uL — ABNORMAL LOW (ref 150–400)
RBC: 3.83 MIL/uL — ABNORMAL LOW (ref 4.22–5.81)
RDW: 13.2 % (ref 11.5–15.5)
WBC: 10.1 10*3/uL (ref 4.0–10.5)
nRBC: 0 % (ref 0.0–0.2)

## 2020-09-14 LAB — RENAL FUNCTION PANEL
Albumin: 2.4 g/dL — ABNORMAL LOW (ref 3.5–5.0)
Anion gap: 9 (ref 5–15)
BUN: 40 mg/dL — ABNORMAL HIGH (ref 8–23)
CO2: 22 mmol/L (ref 22–32)
Calcium: 8.3 mg/dL — ABNORMAL LOW (ref 8.9–10.3)
Chloride: 110 mmol/L (ref 98–111)
Creatinine, Ser: 2.17 mg/dL — ABNORMAL HIGH (ref 0.61–1.24)
GFR, Estimated: 29 mL/min — ABNORMAL LOW (ref >60)
Glucose, Bld: 112 mg/dL — ABNORMAL HIGH (ref 70–99)
Phosphorus: 3.4 mg/dL (ref 2.5–4.6)
Potassium: 4 mmol/L (ref 3.5–5.1)
Sodium: 141 mmol/L (ref 135–145)

## 2020-09-14 LAB — CULTURE, RESPIRATORY W GRAM STAIN
Culture: NORMAL
Special Requests: NORMAL

## 2020-09-14 LAB — TSH: TSH: 1.161 u[IU]/mL (ref 0.350–4.500)

## 2020-09-14 LAB — GLUCOSE, CAPILLARY
Glucose-Capillary: 100 mg/dL — ABNORMAL HIGH (ref 70–99)
Glucose-Capillary: 103 mg/dL — ABNORMAL HIGH (ref 70–99)
Glucose-Capillary: 112 mg/dL — ABNORMAL HIGH (ref 70–99)
Glucose-Capillary: 126 mg/dL — ABNORMAL HIGH (ref 70–99)
Glucose-Capillary: 152 mg/dL — ABNORMAL HIGH (ref 70–99)
Glucose-Capillary: 212 mg/dL — ABNORMAL HIGH (ref 70–99)

## 2020-09-14 LAB — MAGNESIUM: Magnesium: 1.8 mg/dL (ref 1.7–2.4)

## 2020-09-14 IMAGING — RF DG ABDOMEN 1V
2 series · 2 of 2 positions shown · non-contrast
Comparison: None.

CLINICAL DATA: Feeding tube placement

EXAM:
ABDOMEN - 1 VIEW fluoro time:
FLUOROSCOPY TIME:  1 minute, 12 seconds; 6.8 mGy

[Series 1: cp_standard · 0.27mm/px · 1 of 1 slices shown (1 of 2)]
[im 1/1]
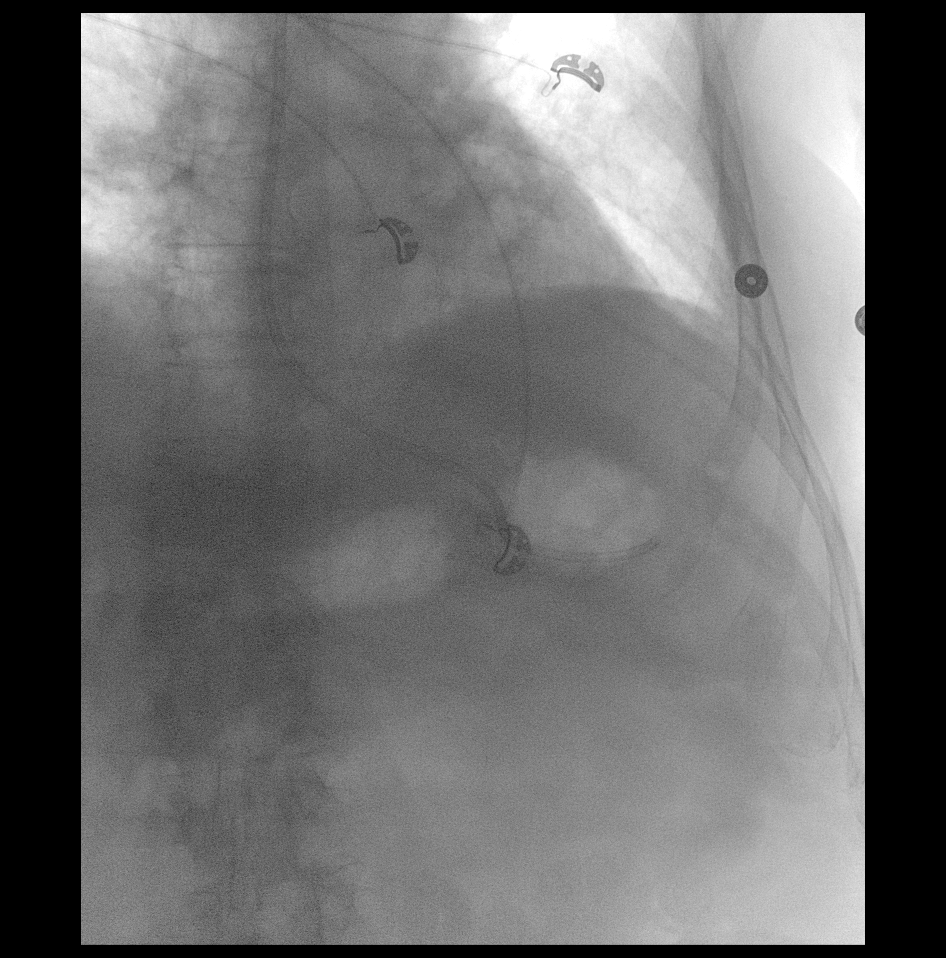

[Series 2: cp_standard · 0.27mm/px · 1 of 1 slices shown (2 of 2)]
[im 1/1]
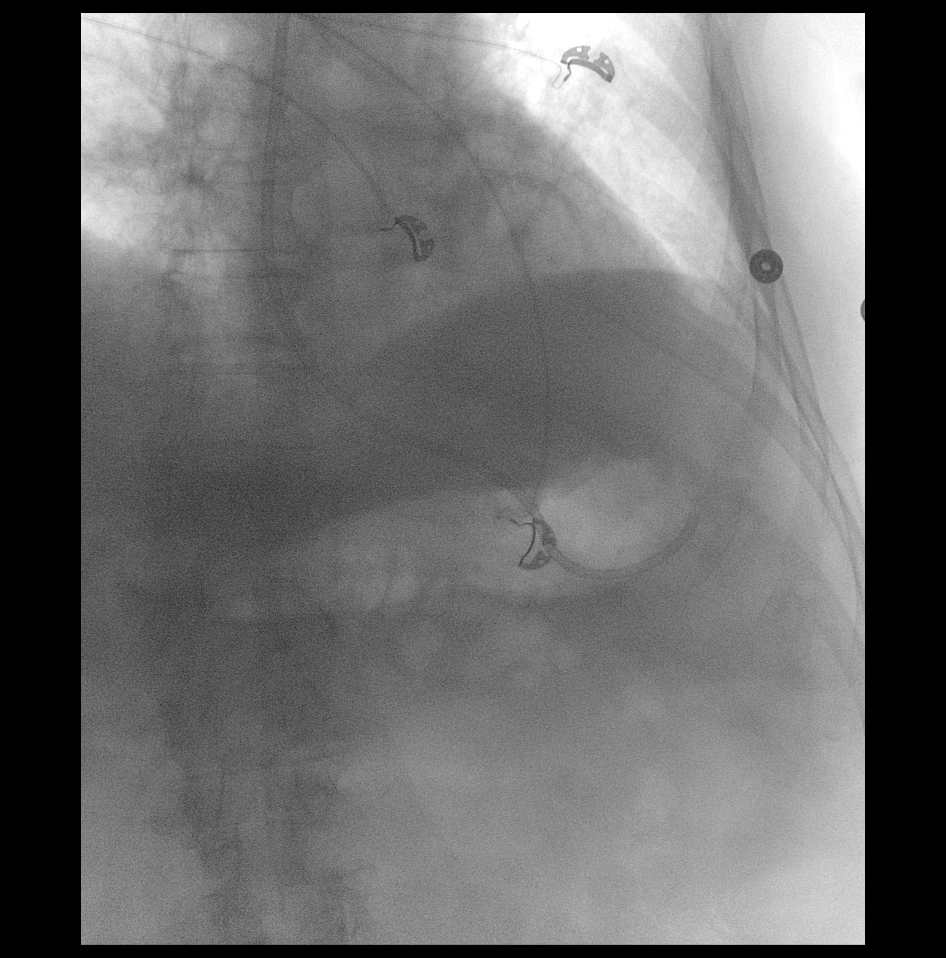

[2 of 2 positions shown; findings below may reference images not displayed]

FINDINGS: Enteric tube overlies the left upper quadrant. Injected air is
within the stomach lumen confirming placement.
IMPRESSION: Enteric tube within the stomach.

## 2020-09-14 MED ORDER — FENTANYL CITRATE (PF) 100 MCG/2ML IJ SOLN
25.0000 ug | INTRAMUSCULAR | Status: DC | PRN
  Administered 2020-09-14 – 2020-09-17 (×9): 25 ug via INTRAVENOUS
  Filled 2020-09-14 (×9): qty 2

## 2020-09-14 MED ORDER — PROSOURCE TF PO LIQD
45.0000 mL | Freq: Three times a day (TID) | ORAL | Status: DC
  Administered 2020-09-14 – 2020-09-17 (×7): 45 mL
  Filled 2020-09-14 (×8): qty 45

## 2020-09-14 MED ORDER — JEVITY 1.2 CAL PO LIQD
1000.0000 mL | ORAL | Status: DC
  Administered 2020-09-14 – 2020-09-16 (×3): 1000 mL
  Filled 2020-09-14 (×5): qty 1000

## 2020-09-14 MED ORDER — LIDOCAINE VISCOUS HCL 2 % MT SOLN
15.0000 mL | Freq: Once | OROMUCOSAL | Status: AC
  Administered 2020-09-14: 1 mL via OROMUCOSAL

## 2020-09-14 MED ORDER — METOPROLOL TARTRATE 5 MG/5ML IV SOLN
2.5000 mg | Freq: Once | INTRAVENOUS | Status: AC
  Administered 2020-09-14: 2.5 mg via INTRAVENOUS
  Filled 2020-09-14: qty 5

## 2020-09-14 MED ORDER — CHLORHEXIDINE GLUCONATE 0.12 % MT SOLN
OROMUCOSAL | Status: AC
  Administered 2020-09-14: 15 mL via OROMUCOSAL
  Filled 2020-09-14: qty 15

## 2020-09-14 MED ORDER — MAGNESIUM SULFATE 2 GM/50ML IV SOLN
2.0000 g | Freq: Once | INTRAVENOUS | Status: AC
  Administered 2020-09-14: 2 g via INTRAVENOUS
  Filled 2020-09-14: qty 50

## 2020-09-14 MED ORDER — SCOPOLAMINE 1 MG/3DAYS TD PT72
1.0000 | MEDICATED_PATCH | TRANSDERMAL | Status: DC
  Administered 2020-09-14 – 2020-09-17 (×2): 1.5 mg via TRANSDERMAL
  Filled 2020-09-14 (×2): qty 1

## 2020-09-14 NOTE — Progress Notes (Signed)
Patient transported to IR and back without complications. RN at bedside. ?

## 2020-09-14 NOTE — Progress Notes (Signed)
NAME:  Andre Adams, MRN:  [id number], DOB:  11/14/1933, LOS: 3 ADMISSION DATE:  09/11/2020, CONSULTATION DATE:  7/23 REFERRING MD:  Andre Cooley MD, CHIEF COMPLAINT:   fall and left sided weakness   Brief HPI:   Goes by Andre Adams"  71 yoM with prior hx of HTN, HL, Afib off AC due to GIB HX, CKD3, frequent falls presented 7/23 form [facility] nursing home with left sided weakness with flaccid L dide arm/face/leg and was nonverbal.  LKW 1445. NIHSS score 23. TPA was not give due to fall with head trauma. CTA head showed R MCA stroke secondary to a R M2 occlusion. He was taken to [facility] with Dr. Corliss Skains.  Returned to ICU intubated, PCCM following.   Pertinent  Medical History  HTN, HL, Afib off AC due to GIB HX, CKD3, frequent falls  Significant Hospital Events: Including procedures, antibiotic start and stop dates in addition to other pertinent events   7/23 Admitted with acute R M2 occlusion s/p NIR 7/24 loaded with keppra for R sided twitching/ EEG, MAE to noxious stimuli, followed LE command for RN; off cleviprex gtt early am; weaned all day PSV 5/5, mental status barrier to extuation   7/23 SARS/ flu > neg 7/23 MRSA > neg 7/24 trach asp >>  7/23 ancef (preop)  Interim History / Subjective:  UOP -1,650 last 24 hours Still having lots of oral secretions On PSV doing well Afib rate controlled in 90s Able to lift head off pillow; opens eyes to command; MAE  Objective   Blood pressure 125/77, pulse 93, temperature 97.6 F (36.4 C), temperature source Axillary, resp. rate 15, height 5\' 7"  (1.702 m), weight 69.7 kg, SpO2 100 %.    Vent Mode: PSV;CPAP FiO2 (%):  [30 %] 30 % Set Rate:  [16 bmp] 16 bmp Vt Set:  [520 mL] 520 mL PEEP:  [5 cmH20] 5 cmH20 Pressure Support:  [5 cmH20-10 cmH20] 10 cmH20 Plateau Pressure:  [13 cmH20] 13 cmH20   Intake/Output Summary (Last 24 hours) at 09/14/2020 0824 Last data filed at 09/14/2020 0700 Gross per 24 hour  Intake 282 ml  Output 1650 ml   Net -1368 ml    Filed Weights   09/11/20 1830 09/14/20 0239  Weight: 68 kg 69.7 kg   Examination: General:  critically ill appearing on mech vent HEENT: MM pink/moist; ETT in place Neuro: MAE to command; opens eyes occasionally to command; cough/gag reflex; PERRL CV: s1s2, afib rhythm with rate 90s, no m/r/g PULM:  dim clear BS bilaterally; on mech vent PSV 10/5 GI: soft, bsx4 active  Extremities: warm/dry, no edema  Skin: no rashes or lesions   Labs: Platelets 121 BUN 40 from 42 Creat 2.17 from 2.34  - 7/24 EEG showing left posterior quadrant epileptiform discharges but no seizure recorded   - 7/24 TTE >> LVEF 65-70%, no WMA, indeterminate diastolic, mild MVR/degenerative, mod AS/ calcification, borderline aortic root dilation   -7/24 MRI/ MRA brain >  1. Moderately large acute right MCA territory infarct with petechial hemorrhage. 2. Moderate chronic small vessel ischemic disease with chronic lacunar infarcts as above. 3. Motion degraded head MRA as detailed above. The revascularized right M2 division remains grossly patent   General:  Elderly frail appearing male comfortable on MV/ intubated HEENT: MM pink/moist, drooling out of left corner of mouth, will not open eyes, pupils 5/reactive, anicteric, some exophthalmos L> R, weak corneals, ETT/ OGT Neuro: will not open eyes to verbal/ noxious stimuli, is purposeful in  both upper extremities against gravity, will follow command in RUE/ RLE, withdrawals in LLE/ intermittently able to wiggle toes, some shakiness in RUE when provoked, no clear seizure activity  CV: afib, ir ir, no obvious murmur, right groin site with no palpable hematoma, some ecchymosis  PULM:  non labored, doing well on PSV, minimal clear/ white secretions, CTA GI: soft, bs+, ND/ NT, condom cath Extremities: warm/dry, no LE edema, left wrist remains in ace bandage Skin: no rashes    Resolved Hospital Problem list     Assessment & Plan:  R MCA stroke  secondary to a R M2 occlusion S/P thrombectomy: likely related to Afib off AC given prior GIB 10/2019 P: -Neuro primary -continue ASA per neurology; will start DOAC 7-10 days post stroke -SBP goal <160; continue lopressor -statin -PT/OT  Acute respiratory insufficiency related to above  P: -continue mech vent support PSV -VAP prevention in place -minimize sedating meds for RASS 0 -wean fio2 for sats >92% -ordered scopolamine patch for excessive oral secretions prior to extubation; consider giving robinul prior to extubation if oral secretions persist  -will hold on extubation this morning; GOC discussion with wife and daughter at bedside. Worried patient will not protect airway and require reintubation. Will reevaluate later this afternoon with family to see if any decisions have been made about reintubation vs. One way extubation  Concern for seizure: EEG no seizure P: - will stop keppra per neuro to prevent sedating effects of medicine  DM2 P: - SSI an CBG -hold basal insulin this morning -cor trak to be placed in IR at 1400 today; can restart basal insulin when tube feeds are restarted  HX Atrial fibrillation off AC due to GIB hx Afib on EKG P: -continue lopressor -ASA for [facility]; neuro recommends starting DOAC 7-10 days post stroke  CKD3 on AKI Creat 2.50 on admission. Unclear baseline. Improved with fluids P: -Trend BMP/Mag/UOP -replete Mag today -continue doxazosin -avoid nephrotoxic agents; renal dose meds   Best Practice (right click and "Reselect all SmartList Selections" daily)   Diet/type: NPO; TF  DVT prophylaxis: SCD; lovenox (monitor renal function closely)  GI prophylaxis: PPI Lines: N/A Foley:  N/A; monitor for urinary retention  Code Status:  full code Last date of multidisciplinary goals of care discussion: Patient's wife updated at bedside this morning, Dr. Roda Shutters also at bedside for discussion.  I did relay my concerns with extubation, air way protection,  high risk for aspiration and reintubation at this time.  We did approach GOCs.  Patient has a living will (from 89) being scanned on the chart, but wife needs some time to think and discuss with their children, but most likely, she does not want reintubation when we extubate.  Given his neuro exam has improved some over the last 24 hours, but yet not quite there, and we are trying to minimize his sedation given his reported sensitivities to optimize his best chance for likely a one-way extubation.  Will defer extubation today and hopefully he will continue to improve his mental status and chances for a successful extubation.   7/26: GOC discussion with wife and daughter at bedside. Daughter was not aware of discussion yesterday about no trach and no reintubation. Also discussed options for DNR. They currently want to think about this all since this is new information before they make a decision. Will re-evaluate later this afternoon to see if any decisions have been made.   Critical care time: 35 mins  JD Anselm Lis [facility] 09/14/2020, 10:33 AM  Please see [email or web address] for pager details.  From 7A-7P if no response, please call [phone]. After hours, please call ELink [phone].

## 2020-09-14 NOTE — Progress Notes (Signed)
SLP Cancellation Note  Patient Details Name: EUGEAN ARNOTT MRN: [id number] DOB: 08/28/1933   Cancelled treatment:       Reason Eval/Treat Not Completed: Patient not medically ready (remains on vent this am). Note that as of previous date, plan was for optimization prior to one-way extubation. Will continue to follow.    Mahala Menghini., M.A. CCC-SLP Acute Rehabilitation Services Pager [phone] Office [phone]  09/14/2020, 7:20 AM

## 2020-09-14 NOTE — Progress Notes (Signed)
Referring Physician(s): Xu,J  Supervising Physician: Julieanne Cottoneveshwar, Sanjeev  [facility]  Chief Complaint:  Left sided weakness, right MCA infarct  Subjective: Pt remains intubated, family in room; responds to voice, following some commands   Allergies: Baclofen  Medications: Prior to Admission medications   Medication Sig Start Date End Date Taking? Authorizing Provider  acetaminophen (TYLENOL) 500 MG tablet Take 500 mg by mouth 3 (three) times daily as needed for mild pain.   Yes [provider]  ergocalciferol (VITAMIN D2) 1.25 MG (50000 UT) capsule Take 50,000 Units by mouth once a week.   Yes [provider]  folic acid (FOLVITE) 1 MG tablet Take 1 mg by mouth daily.   Yes [provider]  LANTUS SOLOSTAR 100 UNIT/ML Solostar Pen Inject 6 Units into the skin 2 (two) times daily. 08/21/20  Yes [provider]  magnesium hydroxide (MILK OF MAGNESIA) 400 MG/5ML suspension Take 30 mLs by mouth 2 (two) times daily as needed for mild constipation.   Yes [provider]  metoprolol tartrate (LOPRESSOR) 50 MG tablet Take 50 mg by mouth 2 (two) times daily. 08/16/20  Yes [provider]  midodrine (PROAMATINE) 5 MG tablet Take 5 mg by mouth 3 (three) times daily. 08/08/20  Yes [provider]  NOVOLOG FLEXPEN 100 UNIT/ML FlexPen Inject 0-14 Units into the skin 2 (two) times daily. 0-200 0 units 201-250 2 units 251-300 4 units 301-350 6 units 351-400 8 units 401-450 10 units 451-500 12 units >500 14 units & recheck CBG in 2 hours- if CBG remains over 500 call MD 04/19/20  Yes [provider]  OZEMPIC, 0.25 OR 0.5 MG/DOSE, 2 MG/1.5ML SOPN Inject 0.25 mg into the skin once a week. Mondays 08/02/20  Yes [provider]  pantoprazole (PROTONIX) 40 MG tablet Take 40 mg by mouth daily. 09/02/20  Yes [provider]  polyethylene glycol (MIRALAX / GLYCOLAX) 17 g packet Take 17 g by mouth daily as  needed for mild constipation.   Yes [provider]  polyvinyl alcohol (LIQUIFILM TEARS) 1.4 % ophthalmic solution Place 2 drops into both eyes 4 (four) times daily.   Yes [provider]  PRESCRIPTION MEDICATION Take 120 mLs by mouth 3 (three) times daily. House stock supplement- may mix with ice cream   Yes [provider]  simethicone (MYLICON) 125 MG chewable tablet Chew 125 mg by mouth 2 (two) times daily as needed for flatulence.   Yes [provider]  tamsulosin (FLOMAX) 0.4 MG CAPS capsule Take 0.4 mg by mouth daily. 08/16/20  Yes [provider]  vitamin B-12 (CYANOCOBALAMIN) 1000 MCG tablet Take 1,000 mcg by mouth daily.   Yes [provider]  vitamin C (ASCORBIC ACID) 500 MG tablet Take 500 mg by mouth daily.   Yes [provider]     Vital Signs: BP 125/77   Pulse 93   Temp 98.4 F (36.9 C) (Axillary)   Resp 15   Ht 5\' 7"  (1.702 m)   Wt 153 lb 10.6 oz (69.7 kg)   SpO2 100%   BMI 24.07 kg/m   Physical Exam intubated, responding to voice, opens eyes to pain, FC, PEERL, purposeful movement noted all fours (4/5). Rt groin access site soft, mildly ecchymotic, no active bleeding or worsening hematoma  Imaging: DG Wrist Complete Left  Result Date: 09/11/2020 CLINICAL DATA:  fall EXAM: LEFT HAND - COMPLETE 3+ VIEW; LEFT WRIST - COMPLETE 3+ VIEW COMPARISON:  December 15, 2019 common March 13, 2020 FINDINGS: Revisualization of the sequela of an impacted fracture of the distal LEFT radius and ulna. There is mature osseous bridging with scattered residual areas of lucency from prior fracture sites. Fracture fragments are in unchanged alignment. No definitive acute fracture. Vascular calcifications. Osteopenia. Degenerative changes throughout the DIPs and PIPs. IMPRESSION: Revisualization of sequela of prior impacted fracture of the distal radius and ulna. Evaluation for superimposed acute fracture is limited due to osteopenia  and underlying chronic osseous remodeling. No definitive superimposed acute fracture is noted. If persistent clinical concern for scaphoid fracture, recommend immobilization and follow-up radiographs in 2 weeks versus MRI. Electronically Signed   By: Meda Klinefelter MD   On: 09/11/2020 19:17   CT HEAD WO CONTRAST  Result Date: 09/12/2020 CLINICAL DATA:  Status post thrombectomy EXAM: CT HEAD WITHOUT CONTRAST TECHNIQUE: Contiguous axial images were obtained from the base of the skull through the vertex without intravenous contrast. COMPARISON:  None. FINDINGS: Brain: There is hyperdensity over the right MCA territory, likely contrast staining. There is generalized atrophy without lobar predilection. There is periventricular hypoattenuation compatible with chronic microvascular disease. Vascular: No abnormal hyperdensity of the major intracranial arteries or dural venous sinuses. No intracranial atherosclerosis. Skull: The visualized skull base, calvarium and extracranial soft tissues are normal. Sinuses/Orbits: No fluid levels or advanced mucosal thickening of the visualized paranasal sinuses. No mastoid or middle ear effusion. The orbits are normal. IMPRESSION: Hyperdensity over the right MCA territory, likely contrast staining. Electronically Signed   By: Deatra Robinson M.D.   On: 09/12/2020 02:55   CT Cervical Spine Wo Contrast  Result Date: 09/11/2020 CLINICAL DATA:  Facial trauma. EXAM: CT CERVICAL SPINE WITHOUT CONTRAST TECHNIQUE: Multidetector CT imaging of the cervical spine was performed without intravenous contrast. Multiplanar CT image reconstructions were also generated. COMPARISON:  None. FINDINGS: Alignment: No evidence of acute traumatic subluxation. Skull base and vertebrae: No acute cervical spine fracture or suspicious osseous lesion. Mild superior endplate compression fracture at T3, likely chronic. Soft tissues and spinal canal: No prevertebral fluid or swelling. No visible canal  hematoma. Disc levels: Moderate to severe disc space narrowing from C4-5 to C6-7. Severe bilateral facet arthrosis at C3-4 with small subchondral cysts or erosions and joint widening on the right. Right facet ankylosis at C4-5. No evidence of high-grade spinal stenosis or high-grade neural foraminal stenosis. Upper chest: No apical lung consolidation or mass. Other: Subcentimeter thyroid nodules for which no imaging follow-up is recommended. Carotid atherosclerosis. IMPRESSION: 1. No acute cervical spine fracture. 2. Advanced cervical disc and facet degeneration. Electronically Signed   By: Sebastian Ache M.D.   On: 09/11/2020 19:01   MR ANGIO HEAD WO CONTRAST  Result Date: 09/12/2020 CLINICAL DATA:  Neuro deficit, acute, stroke suspected; Stroke, follow up. Status post right MCA thrombectomy. EXAM: MRI HEAD WITHOUT CONTRAST MRA HEAD WITHOUT CONTRAST TECHNIQUE: Multiplanar, multi-echo pulse sequences of the brain and surrounding structures were acquired without intravenous contrast. Angiographic images of the Circle of Willis were acquired using MRA technique without intravenous contrast. COMPARISON:  Head CT 09/12/2020. Head and neck CTA and CTP 09/11/2020. FINDINGS: MRI HEAD FINDINGS Brain: There is a moderately large acute right MCA territory infarct involving much of the lateral temporal lobe, insula, and small portions of the frontal and parietal lobes including the operculum. There is associated petechial hemorrhage. There is mild cytotoxic edema without significant mass effect. T2 hyperintensities elsewhere in the cerebral white matter bilaterally are nonspecific but compatible with moderate  chronic small vessel ischemic disease. There are small chronic infarcts in the left basal ganglia, left thalamus, and right greater than left cerebellum. There are chronic blood products associated with the left basal ganglia infarct, and there are also a few scattered chronic microhemorrhages in the cerebrum and as  well as one in the cerebellum. Mild chronic small vessel changes are noted in the pons. There is mild to moderate cerebral atrophy. No mass, midline shift, or extra-axial fluid collection is identified. Vascular: Arteries more fully evaluated below. Suspected slow flow in the right transverse and sigmoid sinuses. Skull and upper cervical spine: Unremarkable bone marrow signal. Sinuses/Orbits: Bilateral cataract extraction. Paranasal sinuses and mastoid air cells are clear. Other: None. MRA HEAD FINDINGS The study is moderately motion degraded. Anterior circulation: The internal carotid arteries are patent from skull base to carotid termini with detailed assessment of bilateral paraclinoid stenoses limited by motion. The ACAs are patent with a mild-to-moderate a left A1 stenosis again noted. The M1 segments are patent with motion artifact limiting detailed assessment of the distal right M1 segment and of bilateral MCA branch vessels, however the revascularized right M2 superior division remains grossly patent. No aneurysm is identified. Posterior circulation: The visualized distal vertebral arteries are patent to the basilar with the left being mildly dominant. The basilar artery is patent with a mild stenosis in its midportion, less severe than the appearance on yesterday's CTA. The PCAs are patent with a severe stenosis again noted near the left P1-P2 junction. There is only evidence of mild narrowing near the right P1-P2 junction in contrast to the appearance severe stenosis on yesterday's CTA. No aneurysm is identified. Anatomic variants: None. IMPRESSION: 1. Moderately large acute right MCA territory infarct with petechial hemorrhage. 2. Moderate chronic small vessel ischemic disease with chronic lacunar infarcts as above. 3. Motion degraded head MRA as detailed above. The revascularized right M2 division remains grossly patent. Electronically Signed   By: Sebastian Ache M.D.   On: 09/12/2020 15:00   MR BRAIN WO  CONTRAST  Result Date: 09/12/2020 CLINICAL DATA:  Neuro deficit, acute, stroke suspected; Stroke, follow up. Status post right MCA thrombectomy. EXAM: MRI HEAD WITHOUT CONTRAST MRA HEAD WITHOUT CONTRAST TECHNIQUE: Multiplanar, multi-echo pulse sequences of the brain and surrounding structures were acquired without intravenous contrast. Angiographic images of the Circle of Willis were acquired using MRA technique without intravenous contrast. COMPARISON:  Head CT 09/12/2020. Head and neck CTA and CTP 09/11/2020. FINDINGS: MRI HEAD FINDINGS Brain: There is a moderately large acute right MCA territory infarct involving much of the lateral temporal lobe, insula, and small portions of the frontal and parietal lobes including the operculum. There is associated petechial hemorrhage. There is mild cytotoxic edema without significant mass effect. T2 hyperintensities elsewhere in the cerebral white matter bilaterally are nonspecific but compatible with moderate chronic small vessel ischemic disease. There are small chronic infarcts in the left basal ganglia, left thalamus, and right greater than left cerebellum. There are chronic blood products associated with the left basal ganglia infarct, and there are also a few scattered chronic microhemorrhages in the cerebrum and as well as one in the cerebellum. Mild chronic small vessel changes are noted in the pons. There is mild to moderate cerebral atrophy. No mass, midline shift, or extra-axial fluid collection is identified. Vascular: Arteries more fully evaluated below. Suspected slow flow in the right transverse and sigmoid sinuses. Skull and upper cervical spine: Unremarkable bone marrow signal. Sinuses/Orbits: Bilateral cataract extraction. Paranasal sinuses and mastoid  air cells are clear. Other: None. MRA HEAD FINDINGS The study is moderately motion degraded. Anterior circulation: The internal carotid arteries are patent from skull base to carotid termini with detailed  assessment of bilateral paraclinoid stenoses limited by motion. The ACAs are patent with a mild-to-moderate a left A1 stenosis again noted. The M1 segments are patent with motion artifact limiting detailed assessment of the distal right M1 segment and of bilateral MCA branch vessels, however the revascularized right M2 superior division remains grossly patent. No aneurysm is identified. Posterior circulation: The visualized distal vertebral arteries are patent to the basilar with the left being mildly dominant. The basilar artery is patent with a mild stenosis in its midportion, less severe than the appearance on yesterday's CTA. The PCAs are patent with a severe stenosis again noted near the left P1-P2 junction. There is only evidence of mild narrowing near the right P1-P2 junction in contrast to the appearance severe stenosis on yesterday's CTA. No aneurysm is identified. Anatomic variants: None. IMPRESSION: 1. Moderately large acute right MCA territory infarct with petechial hemorrhage. 2. Moderate chronic small vessel ischemic disease with chronic lacunar infarcts as above. 3. Motion degraded head MRA as detailed above. The revascularized right M2 division remains grossly patent. Electronically Signed   By: Sebastian Ache M.D.   On: 09/12/2020 15:00   DG Pelvis Portable  Result Date: 09/11/2020 CLINICAL DATA:  trauma EXAM: PORTABLE PELVIS 1-2 VIEWS COMPARISON:  December 15, 2019 FINDINGS: Osteopenia. Excreted contrast limits evaluation of the sacrum. No acute displaced fracture seen on single view. No pelvic diastasis. Vascular calcifications. Degenerative changes of the lumbar spine. IMPRESSION: No acute fracture on single view. If persistent concern for nondisplaced hip or pelvic fracture, recommend dedicated pelvic MRI. Electronically Signed   By: Meda Klinefelter MD   On: 09/11/2020 19:13   DG CHEST PORT 1 VIEW  Result Date: 09/12/2020 CLINICAL DATA:  Code stroke. EXAM: PORTABLE CHEST 1 VIEW  COMPARISON:  09/11/2020 FINDINGS: 0629 hours. Endotracheal tube tip is 5.4 cm above the base of the carina. NG tube tip is in the distal esophagus with proximal side port in the mid esophagus. This tube could be advanced approximately 12-13 cm to place the proximal side port below the GE junction. Interstitial markings are diffusely coarsened with chronic features. Interval decrease in retrocardiac left base atelectasis. Bones are diffusely demineralized. Telemetry leads overlie the chest. IMPRESSION: 1. Endotracheal tube tip is 5.4 cm above the base of the carina. 2. NG tube tip is in the distal esophagus, see above. 3. Persistent chronic interstitial coarsening with interval improvement in retrocardiac left base atelectasis. Electronically Signed   By: Kennith Center M.D.   On: 09/12/2020 08:59   Portable Chest x-ray  Result Date: 09/11/2020 CLINICAL DATA:  87 year old male status post intubation. EXAM: PORTABLE CHEST 1 VIEW COMPARISON:  Earlier radiograph dated 09/11/2020. FINDINGS: Interval placement of an endotracheal tube with tip approximately 6 cm above the carina. Enteric tube with side-port in the distal esophagus and tip close to the GE junction. Recommend further advancing of the enteric tube by at least additional 10 cm. Diffuse interstitial coarsening and left lung base atelectasis similar to prior radiograph. Stable cardiomediastinal silhouette. Atherosclerotic calcification of the aorta. No acute osseous pathology. IMPRESSION: 1. Endotracheal tube above the carina. 2. Enteric tube with side-port in the distal esophagus and tip close to the GE junction. Recommend further advancing of the enteric tube by at least 10 cm. Electronically Signed   By: Elgie Collard  M.D.   On: 09/11/2020 23:08   DG Chest Portable 1 View  Result Date: 09/11/2020 CLINICAL DATA:  trauma EXAM: PORTABLE CHEST 1 VIEW COMPARISON:  October 23, 2019, October 03, 2019 FINDINGS: Evaluation is limited secondary to patient  rotation. The cardiomediastinal silhouette is grossly unchanged in contour. No pleural effusion. No pneumothorax. Diffuse interstitial prominence. Visualized abdomen is unremarkable. Compression fracture deformity of the T11, unchanged in comparison to prior. Compression fracture of T9 appears similar comparison to prior. IMPRESSION: Diffuse interstitial prominence likely reflecting underlying pulmonary edema. Differential considerations include atypical infection. Electronically Signed   By: Meda Klinefelter MD   On: 09/11/2020 19:12   DG Abd Portable 1V  Result Date: 09/12/2020 CLINICAL DATA:  Enteric tube placement EXAM: PORTABLE ABDOMEN - 1 VIEW COMPARISON:  September 12, 2020 FINDINGS: Incomplete assessment of majority of the abdomen. Enteric tube tip and side port project over the stomach. Scattered linear opacities, likely atelectasis. Coarse reticular opacities unchanged. Atherosclerotic calcifications of the aorta. Unchanged cardiomediastinal silhouette. ETT tip terminates 2.6 cm above the carina. Chronic compression fracture deformity at the thoracolumbar junction. IMPRESSION: Enteric tube tip and side port project over the stomach. Electronically Signed   By: Meda Klinefelter MD   On: 09/12/2020 14:39   DG Hand Complete Left  Result Date: 09/11/2020 CLINICAL DATA:  fall EXAM: LEFT HAND - COMPLETE 3+ VIEW; LEFT WRIST - COMPLETE 3+ VIEW COMPARISON:  December 15, 2019 common March 13, 2020 FINDINGS: Revisualization of the sequela of an impacted fracture of the distal LEFT radius and ulna. There is mature osseous bridging with scattered residual areas of lucency from prior fracture sites. Fracture fragments are in unchanged alignment. No definitive acute fracture. Vascular calcifications. Osteopenia. Degenerative changes throughout the DIPs and PIPs. IMPRESSION: Revisualization of sequela of prior impacted fracture of the distal radius and ulna. Evaluation for superimposed acute fracture is limited  due to osteopenia and underlying chronic osseous remodeling. No definitive superimposed acute fracture is noted. If persistent clinical concern for scaphoid fracture, recommend immobilization and follow-up radiographs in 2 weeks versus MRI. Electronically Signed   By: Meda Klinefelter MD   On: 09/11/2020 19:17   EEG adult  Result Date: 09/12/2020 Rejeana Brock, MD     09/12/2020  7:33 PM History: R sided twitching Sedation: none Technique: This is a 21 channel routine scalp EEG performed at the bedside with bipolar and monopolar montages arranged in accordance to the international 10/20 system of electrode placement. One channel was dedicated to EKG recording. Background: There is a posterior.rhythm seen at times with a frequency of 8 to 9 Hz.  In addition, there is diffuse alpha and beta range activities consistent with patient's sedated state.  There is diffuse irregular delta and theta which is also consistent with patient's sedation. There are sharply contoured theta and alpha range activity over the temporal lobes which could be consistent with that with the rhythm, but there are also intermittent sharply contoured waves which appear to disrupt the background consistent with sharp waves.  These are maximal at  P7, 01, P3. Photic stimulation: Physiologic driving is not performed EEG Abnormalities: 1) Left posterior quadrant epileptiform discharges Clinical Interpretation: This EEG is consistent with a left posterior quadrant potential area of epileptogenicity. There was no seizure recorded on this study. Ritta Slot, MD [facility] [phone] If 7pm- 7am, please page neurology on call as listed in AMION.   Overnight EEG with video  Result Date: 09/13/2020 Charlsie Quest, MD  09/13/2020 10:24 AM Patient Name: ZAILYN ROWSER MRN: [id number] Epilepsy Attending: Charlsie Quest Referring Physician/Provider: Dr Marvel Plan Duration: 09/12/2020 1702 to 09/13/2020 1022  Patient history: 87 year old male with right MCA stroke noted to have right-sided twitching.  EEG to evaluate for seizures. Level of alertness: Awake, asleep AEDs during EEG study: LEV, propofol Technical aspects: This EEG study was done with scalp electrodes positioned according to the 10-20 International system of electrode placement. Electrical activity was acquired at a sampling rate of 500Hz  and reviewed with a high frequency filter of 70Hz  and a low frequency filter of 1Hz . EEG data were recorded continuously and digitally stored. Description: The posterior dominant rhythm consists of 8-9Hz  activity of moderate voltage (25-35 uV) seen predominantly in posterior head regions, symmetric and reactive to eye opening and eye closing. Sleep was characterized by vertex waves, sleep spindles (12 to 14 Hz), maximal frontocentral region.  EEG also showed continuous 3 to 5 Hz theta-delta slowing in right frontotemporal region which at times appeared sharply contoured with triphasic morphology, predominantly when patient is awake/stimulated.  Hyperventilation and photic stimulation were not performed.   ABNORMALITY - Continuous slow, right frontotemporal region IMPRESSION: This study is suggestive of cortical dysfunction in right frontotemporal region likely secondary to underlying infarct.  No seizures or definite epileptiform discharges were seen throughout the recording.   ECHOCARDIOGRAM COMPLETE  Result Date: 09/12/2020    ECHOCARDIOGRAM REPORT   Patient Name:   MY MADARIAGA Date of Exam: 09/12/2020 Medical Rec #:  09/14/2020        Height:       67.0 in Accession #:    Elpidio Eric       Weight:       150.0 lb Date of Birth:  03/11/1933       BSA:          1.790 m Patient Age:    86 years         BP:           111/65 mmHg Patient Gender: M                HR:           81 bpm. Exam Location:  Inpatient Procedure: 2D Echo, Cardiac Doppler and Color Doppler Indications:    Stroke  History:         Patient has no prior history of Echocardiogram examinations.  Sonographer:    [id number] RDCS Referring Phys: [id number] 01/10/1934 STACK IMPRESSIONS  1. Left ventricular ejection fraction, by estimation, is 65 to 70%. The left ventricle has normal function. The left ventricle has no regional wall motion abnormalities. Left ventricular diastolic parameters are indeterminate.  2. RV-RA gradient normal at 23 mmHg. Right ventricular systolic function is normal. The right ventricular size is normal.  3. Left atrial size was severely dilated.  4. The mitral valve is degenerative. Mild mitral valve regurgitation.  5. The aortic valve is tricuspid. There is moderate calcification of the aortic valve. Aortic valve regurgitation is not visualized. Moderate aortic valve stenosis. Aortic valve mean gradient measures 11.0 mmHg. Dimenionless index 0.41.  6. Aortic dilatation noted. There is borderline dilatation of the aortic root, measuring 39 mm.  7. Unable to estimate CVP. FINDINGS  Left Ventricle: Left ventricular ejection fraction, by estimation, is 65 to 70%. The left ventricle has normal function. The left ventricle has no regional wall motion abnormalities. The left ventricular internal cavity size was normal in  size. There is  borderline left ventricular hypertrophy. Left ventricular diastolic parameters are indeterminate. Right Ventricle: RV-RA gradient normal at 23 mmHg. The right ventricular size is normal. No increase in right ventricular wall thickness. Right ventricular systolic function is normal. Left Atrium: Left atrial size was severely dilated. Right Atrium: Right atrial size was normal in size. Pericardium: There is no evidence of pericardial effusion. Mitral Valve: The mitral valve is degenerative in appearance. There is mild thickening of the mitral valve leaflet(s). There is mild calcification of the mitral valve leaflet(s). Mild mitral annular calcification. Mild mitral valve regurgitation. Tricuspid  Valve: The tricuspid valve is grossly normal. Tricuspid valve regurgitation is mild. Aortic Valve: The aortic valve is tricuspid. There is moderate calcification of the aortic valve. There is mild aortic valve annular calcification. Aortic valve regurgitation is not visualized. Moderate aortic stenosis is present. Aortic valve mean gradient measures 11.0 mmHg. Aortic valve peak gradient measures 16.9 mmHg. Aortic valve area, by VTI measures 1.42 cm. Pulmonic Valve: The pulmonic valve was grossly normal. Pulmonic valve regurgitation is mild. Aorta: Aortic dilatation noted. There is borderline dilatation of the aortic root, measuring 39 mm. Venous: Unable to estimate CVP. The inferior vena cava was not well visualized. IAS/Shunts: No atrial level shunt detected by color flow Doppler.  LEFT VENTRICLE PLAX 2D LVIDd:         3.20 cm LVIDs:         2.60 cm LV PW:         1.00 cm LV IVS:        1.00 cm LVOT diam:     2.10 cm LV SV:         54 LV SV Index:   30 LVOT Area:     3.46 cm  RIGHT VENTRICLE TAPSE (M-mode): 1.5 cm LEFT ATRIUM             Index       RIGHT ATRIUM           Index LA diam:        3.90 cm 2.18 cm/m  RA Area:     11.90 cm LA Vol (A2C):   82.1 ml 45.88 ml/m RA Volume:   25.10 ml  14.03 ml/m LA Vol (A4C):   87.0 ml 48.62 ml/m LA Biplane Vol: 89.1 ml 49.79 ml/m  AORTIC VALVE AV Area (Vmax):    1.34 cm AV Area (Vmean):   1.30 cm AV Area (VTI):     1.42 cm AV Vmax:           205.50 cm/s AV Vmean:          143.750 cm/s AV VTI:            0.383 m AV Peak Grad:      16.9 mmHg AV Mean Grad:      11.0 mmHg LVOT Vmax:         79.43 cm/s LVOT Vmean:        53.833 cm/s LVOT VTI:          0.157 m LVOT/AV VTI ratio: 0.41  AORTA Ao Root diam: 3.90 cm Ao Asc diam:  3.40 cm TRICUSPID VALVE TR Peak grad:   23.2 mmHg TR Vmax:        241.00 cm/s  SHUNTS Systemic VTI:  0.16 m Systemic Diam: 2.10 cm Nona Dell MD Electronically signed by Nona Dell MD Signature Date/Time: 09/12/2020/3:37:07 PM    Final     CT HEAD CODE STROKE WO  CONTRAST  Result Date: 09/11/2020 CLINICAL DATA:  Code stroke. Neuro deficit, acute, stroke suspected. EXAM: CT HEAD WITHOUT CONTRAST TECHNIQUE: Contiguous axial images were obtained from the base of the skull through the vertex without intravenous contrast. COMPARISON:  09/27/2019 FINDINGS: Brain: There is no evidence of an acute infarct, intracranial hemorrhage, mass, midline shift, or extra-axial fluid collection. There is mild-to-moderate cerebral atrophy. Patchy hypodensities in the cerebral white matter bilaterally are similar to the prior motion degraded CT and are nonspecific but compatible with moderate chronic small vessel ischemic disease. A chronic infarct is again noted in the left basal ganglia. There is also a small chronic right cerebellar infarct. Vascular: Calcified atherosclerosis at the skull base. No hyperdense vessel. Skull: No fracture or suspicious osseous lesion. Sinuses/Orbits: Paranasal sinuses and mastoid air cells are clear. Bilateral cataract extraction. Other: None. ASPECTS [facility] Stroke Program Early CT Score) - Ganglionic level infarction (caudate, lentiform nuclei, internal capsule, insula, M1-M3 cortex): 7 - Supraganglionic infarction (M4-M6 cortex): 3 Total score (0-10 with 10 being normal): 10 IMPRESSION: 1. No evidence of acute intracranial abnormality. 2. ASPECTS is 10. 3. Moderate chronic small vessel ischemic disease. These results were communicated to Dr. Selina Cooley at 6:21 pm on 09/11/2020 by text page via the [facility] messaging system. Electronically Signed   By: Sebastian Ache M.D.   On: 09/11/2020 18:23   CT ANGIO HEAD NECK W WO CM W PERF (CODE STROKE)  Result Date: 09/11/2020 CLINICAL DATA:  Neuro deficit, acute, stroke suspected. EXAM: CT ANGIOGRAPHY HEAD AND NECK CT PERFUSION BRAIN TECHNIQUE: Multidetector CT imaging of the head and neck was performed using the standard protocol during bolus administration of intravenous contrast. Multiplanar  CT image reconstructions and MIPs were obtained to evaluate the vascular anatomy. Carotid stenosis measurements (when applicable) are obtained utilizing NASCET criteria, using the distal internal carotid diameter as the denominator. Multiphase CT imaging of the brain was performed following IV bolus contrast injection. Subsequent parametric perfusion maps were calculated using RAPID software. CONTRAST:  40mL OMNIPAQUE IOHEXOL 350 MG/ML SOLN COMPARISON:  None. FINDINGS: CTA NECK FINDINGS Aortic arch: Normal variant aortic arch branching pattern with common origin of the brachiocephalic and left common carotid arteries. Mild atherosclerotic plaque without arch vessel origin stenosis. Right carotid system: Patent with predominantly calcified plaque at the carotid bifurcation. No evidence of a significant stenosis or dissection. Left carotid system: Patent with mixed calcified and soft plaque at the carotid bifurcation. No evidence of a significant stenosis or dissection. Vertebral arteries: Patent with the left being mildly dominant. Scattered atherosclerosis bilaterally resulting in severe proximal V1 and mild V3 stenoses on the left. Skeleton: See separate cervical spine CT report. Other neck: Subcentimeter thyroid nodules for which no imaging follow-up is recommended. Upper chest: Mild motion artifact. No apical lung consolidation or mass. Review of the MIP images confirms the above findings CTA HEAD FINDINGS Anterior circulation: The internal carotid arteries are patent from skull base to carotid termini with atherosclerotic plaque resulting in moderate bilateral paraclinoid stenoses. The right M1 segment is patent, however there is a proximal M2 occlusion without significant distal reconstitution. The left MCA is patent with a mild M1 stenosis noted. The ACAs are patent with mild-to-moderate stenosis of the left A1 segment as well as moderate irregular narrowing of both A2 segments. No aneurysm is identified.  Posterior circulation: The intracranial vertebral arteries are patent to the basilar with atherosclerosis resulting in irregularity bilaterally and a moderate stenosis on the right. The basilar artery is patent with a  severe stenosis in its midportion. The right PCA is patent with a severe stenosis near the P1-P2 junction. There are severe stenoses of both PCAs near the P1-P2 junctions with poor flow distally, particularly on the left. No aneurysm is identified. Venous sinuses: As permitted by contrast timing, patent. Anatomic variants: None. Review of the MIP images confirms the above findings CT Brain Perfusion Findings: ASPECTS: 10 CBF (<30%) Volume: 40 mL Perfusion (Tmax>6.0s) volume: 184 mL Mismatch Volume: 144 mL Infarction Location: Right frontal and temporal lobes primarily at the level of the operculum (MCA territory) IMPRESSION: 1. Proximal right M2 occlusion. 2. CTP demonstrates a right MCA core infarct with large penumbra as detailed above. 3. Advanced intracranial atherosclerosis including moderate bilateral ICA stenoses, a severe basilar artery stenosis, and severe bilateral proximal PCA stenoses. 4. Cervical carotid atherosclerosis without significant stenosis. 5. Severe proximal left vertebral artery stenosis. Emergent findings were communicated to Dr. Selina Cooley at 6:35 pm on 09/11/2020 by text page via the [facility] messaging system. Electronically Signed   By: Sebastian Ache M.D.   On: 09/11/2020 18:51   CT Maxillofacial Wo Contrast  Result Date: 09/11/2020 CLINICAL DATA:  Facial trauma. EXAM: CT MAXILLOFACIAL WITHOUT CONTRAST TECHNIQUE: Multidetector CT imaging of the maxillofacial structures was performed. Multiplanar CT image reconstructions were also generated. COMPARISON:  Head CT 09/27/2019 FINDINGS: Osseous: No acute fracture, mandibular dislocation, or destructive osseous process. Orbits: Bilateral cataract extraction and proptosis. No orbital hematoma or mass. Sinuses: Paranasal sinuses and  mastoid air cells are clear. Mild rightward nasal septal deviation. Soft tissues: Punctate left parotid calcification.  Atherosclerosis. Limited intracranial: More fully evaluated on separate head CT. IMPRESSION: No acute maxillofacial fracture. Electronically Signed   By: Sebastian Ache M.D.   On: 09/11/2020 18:55    Labs:  CBC: Recent Labs    09/12/20 0417 09/12/20 0432 09/13/20 1030 09/14/20 0605  WBC 11.4*  --  11.9* 10.1  HGB 13.3 13.3 12.5* 12.3*  HCT 40.7 39.0 37.5* 37.7*  PLT 134*  --  124* 121*    COAGS: Recent Labs    09/11/20 1800  INR 1.1  APTT 30    BMP: Recent Labs    09/11/20 1800 09/11/20 1801 09/11/20 2238 09/12/20 0417 09/12/20 0432 09/13/20 1030 09/14/20 0605  NA 136 136   < > 136 139 138 141  K 4.4 4.2   < > 4.3 4.4 3.9 4.0  CL 103 105  --  108  --  111 110  CO2 21*  --   --  18*  --  20* 22  GLUCOSE 279* 272*  --  247*  --  154* 112*  BUN 44* 44*  --  37*  --  42* 40*  CALCIUM 9.1  --   --  8.0*  --  8.1* 8.3*  CREATININE 2.57* 2.50*  --  2.27*  --  2.34* 2.17*  GFRNONAA 24*  --   --  28*  --  27* 29*   < > = values in this interval not displayed.    LIVER FUNCTION TESTS: Recent Labs    09/11/20 1800 09/13/20 1030 09/14/20 0605  BILITOT 0.9  --   --   AST 18  --   --   ALT 10  --   --   ALKPHOS 87  --   --   PROT 6.3*  --   --   ALBUMIN 3.2* 2.2* 2.4*    Assessment and Plan: 87 y.o. male with history of DM2, CKD  stage 3a, HTN, HL, hx a fib off AC 2/2 hx GIB, mild cognitive impairment and frequent falls who presented after a fall, non verbal and L sided weakness at ALF. He did not receive IV t-PA due to head trauma with fall. Rt MCA CVA noted on imaging; s/p  RT common carotid arteriogram 09/11/20 RT CFA approach. S/P revascularization of occluded dup division Rt MCA  with x 1 pass woth solitaire x 40 mm retriever and contact aspiration achieving a TICI 2C revascularization. Post CT brain.contrast stain in the Rt peri insular and  ant parietal cortical and subcortical regions.  Afebrile, BP 125/77; WBC 10.1, hgb 12.3, creat 2.17(2.34), MR ANGIO HEAD 7/24: 1. Moderately large acute right MCA territory infarct with petechial hemorrhage. 2. Moderate chronic small vessel ischemic disease with chronic lacunar infarcts as above. 3. Motion degraded head MRA as detailed above. The revascularized right M2 division remains grossly patent  Pt on lovenox/ASA; further plans as per neuro  (PT MARKED FOR MERGER WITH ADDITIONAL HX UNDER MRN [id number])   Electronically Signed: D. Jeananne Rama, PA-C 09/14/2020, 10:09 AM   I spent a total of 15 Minutes at the the patient's bedside AND on the patient's hospital floor or unit, greater than 50% of which was counseling/coordinating care for cerebral arteriogram with endovascular intervention    Patient ID: Andre Adams, male   DOB: 1933/07/22, 87 y.o.   MRN: [id number]

## 2020-09-14 NOTE — Progress Notes (Addendum)
STROKE TEAM PROGRESS NOTE   INTERVAL HISTORY Wife and daughter at bedside. Lightly discussed GOC in relation to extubation and possible trach. Family stated that they needed more time to think and discuss. Patient MS had improved from yesterday. Discussed with CCM d/c Keppra to see if MS continues to improve.    OBJECTIVE Vitals:   09/14/20 0600 09/14/20 0700 09/14/20 0731 09/14/20 0800  BP: 116/77 124/85  125/77  Pulse: 89 94 (!) 106 93  Resp: 16 16 17 15   Temp:    98.4 F (36.9 C)  TempSrc:    Axillary  SpO2: 100% 100% 98% 100%  Weight:      Height:        CBC:  Recent Labs  Lab 09/13/20 1030 09/14/20 0605  WBC 11.9* 10.1  HGB 12.5* 12.3*  HCT 37.5* 37.7*  MCV 98.2 98.4  PLT 124* 121*    Basic Metabolic Panel:  Recent Labs  Lab 09/13/20 1030 09/14/20 0605  NA 138 141  K 3.9 4.0  CL 111 110  CO2 20* 22  GLUCOSE 154* 112*  BUN 42* 40*  CREATININE 2.34* 2.17*  CALCIUM 8.1* 8.3*  MG  --  1.8  PHOS 3.3 3.4    Lipid Panel:     Component Value Date/Time   CHOL 147 09/12/2020 0417   TRIG 92 09/13/2020 0540   HDL 27 (L) 09/12/2020 0417   CHOLHDL 5.4 09/12/2020 0417   VLDL 20 09/12/2020 0417   LDLCALC 100 (H) 09/12/2020 0417   HgbA1c:  Lab Results  Component Value Date   HGBA1C 7.8 (H) 09/12/2020   Urine Drug Screen: No results found for: LABOPIA, COCAINSCRNUR, LABBENZ, AMPHETMU, THCU, LABBARB  Alcohol Level     Component Value Date/Time   ETH <10 09/11/2020 1845    IMAGING  No results found.   DG Wrist Complete Left  Result Date: 09/11/2020 CLINICAL DATA:  fall EXAM: LEFT HAND - COMPLETE 3+ VIEW; LEFT WRIST - COMPLETE 3+ VIEW COMPARISON:  December 15, 2019 common March 13, 2020 FINDINGS: Revisualization of the sequela of an impacted fracture of the distal LEFT radius and ulna. There is mature osseous bridging with scattered residual areas of lucency from prior fracture sites. Fracture fragments are in unchanged alignment. No definitive acute  fracture. Vascular calcifications. Osteopenia. Degenerative changes throughout the DIPs and PIPs. IMPRESSION: Revisualization of sequela of prior impacted fracture of the distal radius and ulna. Evaluation for superimposed acute fracture is limited due to osteopenia and underlying chronic osseous remodeling. No definitive superimposed acute fracture is noted. If persistent clinical concern for scaphoid fracture, recommend immobilization and follow-up radiographs in 2 weeks versus MRI. Electronically Signed   By: March 15, 2020 MD   On: 09/11/2020 19:17   CT HEAD WO CONTRAST  Result Date: 09/12/2020 CLINICAL DATA:  Status post thrombectomy EXAM: CT HEAD WITHOUT CONTRAST TECHNIQUE: Contiguous axial images were obtained from the base of the skull through the vertex without intravenous contrast. COMPARISON:  None. FINDINGS: Brain: There is hyperdensity over the right MCA territory, likely contrast staining. There is generalized atrophy without lobar predilection. There is periventricular hypoattenuation compatible with chronic microvascular disease. Vascular: No abnormal hyperdensity of the major intracranial arteries or dural venous sinuses. No intracranial atherosclerosis. Skull: The visualized skull base, calvarium and extracranial soft tissues are normal. Sinuses/Orbits: No fluid levels or advanced mucosal thickening of the visualized paranasal sinuses. No mastoid or middle ear effusion. The orbits are normal. IMPRESSION: Hyperdensity over the right MCA territory, likely contrast  staining. Electronically Signed   By: Deatra Robinson M.D.   On: 09/12/2020 02:55   CT Cervical Spine Wo Contrast  Result Date: 09/11/2020 CLINICAL DATA:  Facial trauma. EXAM: CT CERVICAL SPINE WITHOUT CONTRAST TECHNIQUE: Multidetector CT imaging of the cervical spine was performed without intravenous contrast. Multiplanar CT image reconstructions were also generated. COMPARISON:  None. FINDINGS: Alignment: No evidence of acute  traumatic subluxation. Skull base and vertebrae: No acute cervical spine fracture or suspicious osseous lesion. Mild superior endplate compression fracture at T3, likely chronic. Soft tissues and spinal canal: No prevertebral fluid or swelling. No visible canal hematoma. Disc levels: Moderate to severe disc space narrowing from C4-5 to C6-7. Severe bilateral facet arthrosis at C3-4 with small subchondral cysts or erosions and joint widening on the right. Right facet ankylosis at C4-5. No evidence of high-grade spinal stenosis or high-grade neural foraminal stenosis. Upper chest: No apical lung consolidation or mass. Other: Subcentimeter thyroid nodules for which no imaging follow-up is recommended. Carotid atherosclerosis. IMPRESSION: 1. No acute cervical spine fracture. 2. Advanced cervical disc and facet degeneration. Electronically Signed   By: Sebastian Ache M.D.   On: 09/11/2020 19:01   DG Pelvis Portable  Result Date: 09/11/2020 CLINICAL DATA:  trauma EXAM: PORTABLE PELVIS 1-2 VIEWS COMPARISON:  December 15, 2019 FINDINGS: Osteopenia. Excreted contrast limits evaluation of the sacrum. No acute displaced fracture seen on single view. No pelvic diastasis. Vascular calcifications. Degenerative changes of the lumbar spine. IMPRESSION: No acute fracture on single view. If persistent concern for nondisplaced hip or pelvic fracture, recommend dedicated pelvic MRI. Electronically Signed   By: Meda Klinefelter MD   On: 09/11/2020 19:13   Portable Chest x-ray  Result Date: 09/11/2020 CLINICAL DATA:  87 year old male status post intubation. EXAM: PORTABLE CHEST 1 VIEW COMPARISON:  Earlier radiograph dated 09/11/2020. FINDINGS: Interval placement of an endotracheal tube with tip approximately 6 cm above the carina. Enteric tube with side-port in the distal esophagus and tip close to the GE junction. Recommend further advancing of the enteric tube by at least additional 10 cm. Diffuse interstitial coarsening and  left lung base atelectasis similar to prior radiograph. Stable cardiomediastinal silhouette. Atherosclerotic calcification of the aorta. No acute osseous pathology. IMPRESSION: 1. Endotracheal tube above the carina. 2. Enteric tube with side-port in the distal esophagus and tip close to the GE junction. Recommend further advancing of the enteric tube by at least 10 cm. Electronically Signed   By: Elgie Collard M.D.   On: 09/11/2020 23:08   DG Chest Portable 1 View  Result Date: 09/11/2020 CLINICAL DATA:  trauma EXAM: PORTABLE CHEST 1 VIEW COMPARISON:  October 23, 2019, October 03, 2019 FINDINGS: Evaluation is limited secondary to patient rotation. The cardiomediastinal silhouette is grossly unchanged in contour. No pleural effusion. No pneumothorax. Diffuse interstitial prominence. Visualized abdomen is unremarkable. Compression fracture deformity of the T11, unchanged in comparison to prior. Compression fracture of T9 appears similar comparison to prior. IMPRESSION: Diffuse interstitial prominence likely reflecting underlying pulmonary edema. Differential considerations include atypical infection. Electronically Signed   By: Meda Klinefelter MD   On: 09/11/2020 19:12   DG Hand Complete Left  Result Date: 09/11/2020 CLINICAL DATA:  fall EXAM: LEFT HAND - COMPLETE 3+ VIEW; LEFT WRIST - COMPLETE 3+ VIEW COMPARISON:  December 15, 2019 common March 13, 2020 FINDINGS: Revisualization of the sequela of an impacted fracture of the distal LEFT radius and ulna. There is mature osseous bridging with scattered residual areas of lucency from prior fracture  sites. Fracture fragments are in unchanged alignment. No definitive acute fracture. Vascular calcifications. Osteopenia. Degenerative changes throughout the DIPs and PIPs. IMPRESSION: Revisualization of sequela of prior impacted fracture of the distal radius and ulna. Evaluation for superimposed acute fracture is limited due to osteopenia and underlying chronic  osseous remodeling. No definitive superimposed acute fracture is noted. If persistent clinical concern for scaphoid fracture, recommend immobilization and follow-up radiographs in 2 weeks versus MRI. Electronically Signed   By: Meda KlinefelterStephanie  Peacock MD   On: 09/11/2020 19:17   CT HEAD CODE STROKE WO CONTRAST  Result Date: 09/11/2020 CLINICAL DATA:  Code stroke. Neuro deficit, acute, stroke suspected. EXAM: CT HEAD WITHOUT CONTRAST TECHNIQUE: Contiguous axial images were obtained from the base of the skull through the vertex without intravenous contrast. COMPARISON:  09/27/2019 FINDINGS: Brain: There is no evidence of an acute infarct, intracranial hemorrhage, mass, midline shift, or extra-axial fluid collection. There is mild-to-moderate cerebral atrophy. Patchy hypodensities in the cerebral white matter bilaterally are similar to the prior motion degraded CT and are nonspecific but compatible with moderate chronic small vessel ischemic disease. A chronic infarct is again noted in the left basal ganglia. There is also a small chronic right cerebellar infarct. Vascular: Calcified atherosclerosis at the skull base. No hyperdense vessel. Skull: No fracture or suspicious osseous lesion. Sinuses/Orbits: Paranasal sinuses and mastoid air cells are clear. Bilateral cataract extraction. Other: None. ASPECTS [facility]([place] Stroke Program Early CT Score) - Ganglionic level infarction (caudate, lentiform nuclei, internal capsule, insula, M1-M3 cortex): 7 - Supraganglionic infarction (M4-M6 cortex): 3 Total score (0-10 with 10 being normal): 10 IMPRESSION: 1. No evidence of acute intracranial abnormality. 2. ASPECTS is 10. 3. Moderate chronic small vessel ischemic disease. These results were communicated to Dr. Selina CooleyStack at 6:21 pm on 09/11/2020 by text page via the [facility] messaging system. Electronically Signed   By: Sebastian AcheAllen  Grady M.D.   On: 09/11/2020 18:23   CT ANGIO HEAD NECK W WO CM W PERF (CODE STROKE)  Result Date:  09/11/2020 CLINICAL DATA:  Neuro deficit, acute, stroke suspected. EXAM: CT ANGIOGRAPHY HEAD AND NECK CT PERFUSION BRAIN TECHNIQUE: Multidetector CT imaging of the head and neck was performed using the standard protocol during bolus administration of intravenous contrast. Multiplanar CT image reconstructions and MIPs were obtained to evaluate the vascular anatomy. Carotid stenosis measurements (when applicable) are obtained utilizing NASCET criteria, using the distal internal carotid diameter as the denominator. Multiphase CT imaging of the brain was performed following IV bolus contrast injection. Subsequent parametric perfusion maps were calculated using RAPID software. CONTRAST:  40mL OMNIPAQUE IOHEXOL 350 MG/ML SOLN COMPARISON:  None. FINDINGS: CTA NECK FINDINGS Aortic arch: Normal variant aortic arch branching pattern with common origin of the brachiocephalic and left common carotid arteries. Mild atherosclerotic plaque without arch vessel origin stenosis. Right carotid system: Patent with predominantly calcified plaque at the carotid bifurcation. No evidence of a significant stenosis or dissection. Left carotid system: Patent with mixed calcified and soft plaque at the carotid bifurcation. No evidence of a significant stenosis or dissection. Vertebral arteries: Patent with the left being mildly dominant. Scattered atherosclerosis bilaterally resulting in severe proximal V1 and mild V3 stenoses on the left. Skeleton: See separate cervical spine CT report. Other neck: Subcentimeter thyroid nodules for which no imaging follow-up is recommended. Upper chest: Mild motion artifact. No apical lung consolidation or mass. Review of the MIP images confirms the above findings CTA HEAD FINDINGS Anterior circulation: The internal carotid arteries are patent from skull base to carotid  termini with atherosclerotic plaque resulting in moderate bilateral paraclinoid stenoses. The right M1 segment is patent, however there is a  proximal M2 occlusion without significant distal reconstitution. The left MCA is patent with a mild M1 stenosis noted. The ACAs are patent with mild-to-moderate stenosis of the left A1 segment as well as moderate irregular narrowing of both A2 segments. No aneurysm is identified. Posterior circulation: The intracranial vertebral arteries are patent to the basilar with atherosclerosis resulting in irregularity bilaterally and a moderate stenosis on the right. The basilar artery is patent with a severe stenosis in its midportion. The right PCA is patent with a severe stenosis near the P1-P2 junction. There are severe stenoses of both PCAs near the P1-P2 junctions with poor flow distally, particularly on the left. No aneurysm is identified. Venous sinuses: As permitted by contrast timing, patent. Anatomic variants: None. Review of the MIP images confirms the above findings CT Brain Perfusion Findings: ASPECTS: 10 CBF (<30%) Volume: 40 mL Perfusion (Tmax>6.0s) volume: 184 mL Mismatch Volume: 144 mL Infarction Location: Right frontal and temporal lobes primarily at the level of the operculum (MCA territory) IMPRESSION: 1. Proximal right M2 occlusion. 2. CTP demonstrates a right MCA core infarct with large penumbra as detailed above. 3. Advanced intracranial atherosclerosis including moderate bilateral ICA stenoses, a severe basilar artery stenosis, and severe bilateral proximal PCA stenoses. 4. Cervical carotid atherosclerosis without significant stenosis. 5. Severe proximal left vertebral artery stenosis. Emergent findings were communicated to Dr. Selina Cooley at 6:35 pm on 09/11/2020 by text page via the [facility] messaging system. Electronically Signed   By: Sebastian Ache M.D.   On: 09/11/2020 18:51   CT Maxillofacial Wo Contrast  Result Date: 09/11/2020 CLINICAL DATA:  Facial trauma. EXAM: CT MAXILLOFACIAL WITHOUT CONTRAST TECHNIQUE: Multidetector CT imaging of the maxillofacial structures was performed. Multiplanar CT image  reconstructions were also generated. COMPARISON:  Head CT 09/27/2019 FINDINGS: Osseous: No acute fracture, mandibular dislocation, or destructive osseous process. Orbits: Bilateral cataract extraction and proptosis. No orbital hematoma or mass. Sinuses: Paranasal sinuses and mastoid air cells are clear. Mild rightward nasal septal deviation. Soft tissues: Punctate left parotid calcification.  Atherosclerosis. Limited intracranial: More fully evaluated on separate head CT. IMPRESSION: No acute maxillofacial fracture. Electronically Signed   By: Sebastian Ache M.D.   On: 09/11/2020 18:55    ECG - atrial fibrillation - ventricular response 94 BPM (See cardiology reading for complete details)  PHYSICAL EXAM  Temp:  [97.6 F (36.4 C)-99.6 F (37.6 C)] 98.4 F (36.9 C) (07/26 0800) Pulse Rate:  [68-106] 93 (07/26 0800) Resp:  [15-24] 15 (07/26 0800) BP: (95-133)/(63-98) 125/77 (07/26 0800) SpO2:  [98 %-100 %] 100 % (07/26 0800) Arterial Line BP: (109-123)/(55-56) 123/56 (07/25 1600) FiO2 (%):  [30 %] 30 % (07/26 0731) Weight:  [69.7 kg] 69.7 kg (07/26 0239)  General - Well nourished, well developed, intubated off sedation.  Ophthalmologic - fundi not visualized due to noncooperation.  Cardiovascular - irregularly irregular heart rate and rhythm.  Neuro - intubated off sedation, eyes closed, opens widely voice, able to follow simple commands on the right hand and b/l feet. With forced eye opening, eyes in mid position, chronic left eye propotosis, not blinking to visual threat, not tracking, PERRL.  gag and cough present. Breathing over the vent.  Facial symmetry not able to test due to ET tube.  Tongue protrusion not cooperative. BUEs and BLEs spontaneous movement, BUE against gravity at least 3+/5, BLEs able to move spontaneously 3+/5, not against gravity.  No babinski. Sensation, coordination and gait not tested.   ASSESSMENT/PLAN Andre Adams is a 87 y.o. male with history of DM2, CKD  stage 3a, HTN, HL, hx a fib off AC 2/2 hx GIB, mild cognitive impairment and frequent falls who presented after a fall, non verbal and L sided weakness at ALF. He did not receive IV t-PA due to head trauma with fall. The pt was taken to IR for intervention for thrombectomy Rt M2 lesion.  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 CT Head - No evidence of acute intracranial abnormality.  CTA H&N - Proximal right M2 occlusion. Advanced intracranial atherosclerosis including moderate bilateral ICA stenoses, a severe basilar artery stenosis, and severe bilateral proximal PCA stenoses.  CTP 40/144 cc, positive for penumbra MRI brain - right MCA large infarct with petechial hemorrhage MRA head - patent right M2 2D Echo EF 65-70% LDL - 100 HgbA1c 7.8 VTE prophylaxis -Lovenox No antithrombotics prior to admission, now on aspirin 325. Ongoing aggressive stroke risk factor management Therapy recommendations:  pending Disposition:  Pending  Chronic Afib Was on Coumadin in the past Coumadin discontinued in 10/2019 due to GI bleeding Follow-up with cardiology, wife and pt declined watchman device  Now on ASA Will consider AC with eliquis in 7 to 10 days post stroke (see below)  Hx of GIB Per discharge in 10/2019 "Of note, patient had recent GI bleed last month with no source of bleeding found on colonoscopy although did have a polypack to me for multiple submucosal polyps. Was plan for further polyp removal outpatient but has not done so yet. Patient had episode of large-volume hematochezia on 10/26/2019. GI was consulted and originally reccommended observation. Serial CBCs were obtained with downtrending Hgbs, GI performed colonoscopy on 10/29/19 which revealed large fecal impaction with underlying deep ulcers in the rectum (nonbleeding) consistent with stercoral ulcers. This was thought to be the cause of the  bleeding. Large borders solid stool was removed in combination with digital disimpaction, irrigation, and snare and rat-tooth forceps. Patient received Fleet enema x1 and increase MiraLAX daily 34 mg with continuing daily fiber supplementation. Important to maintain a daily aggressive bowel regimen ondischarge. Do not feel above GI findings are contraindicated for [facility], especially now pt had stroke due to not on [facility] Will consider eliquis in 7-10 days post stroke Continue ASA for now  Respiratory failure Intubated for procedure, still intubated post procedure due to mental status and not able to protect airway CCM on board Off sedation, on weaning now Extubate as able  ? Seizure-like activity Post procedure twitching of right arm and foot noted NOT fit to pt right sided infarct Pt loaded with Keppra x 1, on keppra 500mg  bid -> d/c due to drowsy sleepy LTM EEG no seizure -> d/c  Hypertension Stable at the low end Off IVF On metoprolol and doxazosin Long-term BP goal normotensive  Hyperlipidemia Home Lipid lowering medication: unknown LDL 100, goal < 70 Current lipid lowering medication: lipitor 40 Continue statin at discharge  Uncontrolled Diabetes Home diabetic meds: unknown Uncontrolled  On lantus SSI  CBG monitoring HgbA1c - 7.8, goal < 7.0 PCP close follow up   Other Stroke Risk Factors Advanced age  Other Active Problems Code status - Full code CKD - stage 3b - Cre - 2.5->2.27->2.17 Chronic fractures related to fall - hip, pelvic and LEFT radius and ulna fxs  Mild leukocytosis -  WBCs - 11.4 ->10.1 (afebrile) Frequent falls (2 last year, 1 this year)   Hospital day # 3  Valentina Lucks, MSN, NP-C [facility] [phone]   ATTENDING NOTE: I reviewed above note and agree with the assessment and plan. Pt was seen and examined.   Wife and daughter are at bedside.  Patient still intubated, lethargic, eyes closed, however able to open eyes wider on  voice than yesterday, however inconsistent from time to time.  Still follows some commands on and right hand and the foot, but did not follow commands on the left hand, however left arm spontaneous movement against gravity.  Left lower extremity slight withdraw to pain, right lower extremity 2+/5 withdrawal to pain.   Patient no seizure activity, EEG negative, still on Keppra 500 twice daily.  Given patient drowsiness and sleepiness, will DC Keppra to help patient arousal.  Patient is tolerating weaning so far however not quite sure whether he can protect his airway after extubation. Discussed with wife and daughter at bedside, they still need time to discuss among themselves for one-way extubation versus reintubation if patient did not tolerating extubation.  Discussed with CCM, they do not feel patient is candidate for extubation today but would like to have family decision before considering extubation.  Continue aspirin and Lipitor at this time, supportive care, pending family decision can potential extubation by CCM.  For detailed assessment and plan, please refer to above as I have made changes wherever appropriate.   Marvel Plan, MD PhD Stroke Neurology 09/14/2020 3:28 PM  This patient is critically ill due to right MCA large infarct, A. fib not on AC, history of GI bleeding, and seizure activity at significant risk of neurological worsening, death form recurrent stroke, hemorrhagic conversion, GI bleeding, seizure, heart failure. This patient's care requires constant monitoring of vital signs, hemodynamics, respiratory and cardiac monitoring, review of multiple databases, neurological assessment, discussion with family, other specialists and medical decision making of high complexity. I spent 45 minutes of neurocritical care time in the care of this patient. I had long discussion with wife and daughter at bedside, updated pt current condition, treatment plan and potential prognosis, and answered  all the questions.  They expressed understanding and appreciation.  I also discussed with CCM NP and Dr. Gaynell Face.     To contact Stroke Continuity provider, please refer to [email or web address]. After hours, contact General Neurology

## 2020-09-14 NOTE — Progress Notes (Signed)
Initial Nutrition Assessment  DOCUMENTATION CODES:   Not applicable  INTERVENTION:   Initiate tube feeding via small bore feeding tube: Jevity 1.2 at 60 ml/h (1440 ml per day) Prosource TF 45 ml TID  Provides 1848 kcal, 112 gm protein, 1167 ml free water daily   NUTRITION DIAGNOSIS:   Moderate Malnutrition related to chronic illness as evidenced by moderate fat depletion, moderate muscle depletion, severe muscle depletion.  GOAL:   Patient will meet greater than or equal to 90% of their needs  MONITOR:   TF tolerance  REASON FOR ASSESSMENT:   Consult, Ventilator Enteral/tube feeding initiation and management  ASSESSMENT:   Pt with PMH of uncontrolled DM, CKD stage 3, HTN, HLD, afib, GIB, and frequent falls admitted from ALF with  large R MCA stroke.    Pt discussed during ICU rounds and with RN. OG and cortrak attempts were unsuccessful, plans for tube placement in IR.   7/23 s/p IR for thrombectomy    Spoke with pt's wife and daughter. Daughter provided most of the hx and reports that pt had few falls in 2021 that resulted in pt going to ALF. She report that pt did not like the food and lost about 40 lb. Pt uses cane to get around, he uses a wheelchair when he goes out.    Medications reviewed and include: SSI, lantus, protonix  Labs reviewed:  CBG's: 100-126  NUTRITION - FOCUSED PHYSICAL EXAM:  Flowsheet Row Most Recent Value  Orbital Region Moderate depletion  Upper Arm Region Moderate depletion  Thoracic and Lumbar Region Moderate depletion  Buccal Region Unable to assess  Temple Region Moderate depletion  Clavicle Bone Region Moderate depletion  Clavicle and Acromion Bone Region Moderate depletion  Scapular Bone Region Moderate depletion  Dorsal Hand Unable to assess  Patellar Region Severe depletion  Anterior Thigh Region Severe depletion  Posterior Calf Region Severe depletion  Edema (RD Assessment) None  Hair Reviewed  Eyes Unable to assess   Mouth Unable to assess  Skin Reviewed  [dry]  Nails Unable to assess       Diet Order:   Diet Order             Diet NPO time specified  Diet effective now                   EDUCATION NEEDS:   Not appropriate for education at this time  Skin:  Skin Assessment: Reviewed RN Assessment  Last BM:  7/26  medium  Height:   Ht Readings from Last 1 Encounters:  09/11/20 5\' 7"  (1.702 m)    Weight:   Wt Readings from Last 1 Encounters:  09/14/20 69.7 kg     BMI:  Body mass index is 24.07 kg/m.  Estimated Nutritional Needs:   Kcal:  1800  Protein:  100-120 grams  Fluid:  >1.8 L/day  09/16/20., RD, LDN, CNSC See AMiON for contact information

## 2020-09-14 NOTE — Progress Notes (Signed)
PT Cancellation Note  Patient Details Name: Andre Adams MRN: [id number] DOB: 08/25/1933   Cancelled Treatment:    Reason Eval/Treat Not Completed: Active bedrest order  Lillia Pauls, PT, DPT Acute Rehabilitation Services Pager [phone] Office [phone]    Norval Morton 09/14/2020, 11:03 AM

## 2020-09-14 NOTE — Progress Notes (Signed)
Patient pulled small bore panda tube; informed Elink MD . To be evaluated in am per [facility]

## 2020-09-14 NOTE — Plan of Care (Signed)
Copy of Dr. Corliss Skains procedure note on 7/23 9:44pm  "S/P RT common carotid arteriogram RT CFA approach. S/P revascularization of occluded dup division Rt MCA  with x 1 pass woth solitaire x 40 mm retriever and contact aspiration achieving a TICI 2C revascularization. Post CT brain.contrast stain in the Rt peri insular and ant parietal cortical and subcortical regions.   Manual compression and quick clot  applied for hemostasis at groin puncture site for 20 mins . Patient left intubated to protect airway. Distal pulses all dopplerable . Pupils 2 to 3 mm Rt = Lt reactive. S.Deveshwar MD"

## 2020-09-14 NOTE — Progress Notes (Signed)
.  OT Cancellation Note  Patient Details Name: Andre Adams MRN: [id number] DOB: 1933-07-08   Cancelled Treatment:    Reason Eval/Treat Not Completed: Patient not medically ready;Active bedrest order OT order received and appreciated however this conflicts with current bedrest order set. Please increase activity tolerance as appropriate and remove bedrest from orders. . Please contact OT at [phone] if bed rest order is discontinued. OT will hold evaluation at this time and will check back as time allows pending increased activity orders.    Wynona Neat, OTR/L  Acute Rehabilitation Services Pager: [phone] Office: [phone] .  09/14/2020, 9:54 AM

## 2020-09-14 NOTE — Progress Notes (Signed)
eLink Physician-Brief Progress Note Patient Name: Andre Adams DOB: 12/29/1933 MRN: [id number]   Date of Service  09/14/2020  HPI/Events of Note  Patient pulled out feeding tube and is not able to get Metoprolol per tube. BP = 123/72 and HR = 73.   eICU Interventions  Plan: Metoprolol 2.5 mg IV X 1 now.      Intervention Category Major Interventions: Other:  Lenell Antu 09/14/2020, 10:29 PM

## 2020-09-15 ENCOUNTER — Inpatient Hospital Stay (HOSPITAL_COMMUNITY): Payer: HMO

## 2020-09-15 DIAGNOSIS — I482 Chronic atrial fibrillation, unspecified: Secondary | ICD-10-CM | POA: Diagnosis not present

## 2020-09-15 DIAGNOSIS — J9601 Acute respiratory failure with hypoxia: Secondary | ICD-10-CM

## 2020-09-15 DIAGNOSIS — I639 Cerebral infarction, unspecified: Secondary | ICD-10-CM | POA: Diagnosis not present

## 2020-09-15 DIAGNOSIS — N179 Acute kidney failure, unspecified: Secondary | ICD-10-CM | POA: Diagnosis not present

## 2020-09-15 DIAGNOSIS — I63511 Cerebral infarction due to unspecified occlusion or stenosis of right middle cerebral artery: Secondary | ICD-10-CM | POA: Diagnosis not present

## 2020-09-15 LAB — CBC
HCT: 38.2 % — ABNORMAL LOW (ref 39.0–52.0)
Hemoglobin: 12.3 g/dL — ABNORMAL LOW (ref 13.0–17.0)
MCH: 32.5 pg (ref 26.0–34.0)
MCHC: 32.2 g/dL (ref 30.0–36.0)
MCV: 101.1 fL — ABNORMAL HIGH (ref 80.0–100.0)
Platelets: 120 10*3/uL — ABNORMAL LOW (ref 150–400)
RBC: 3.78 MIL/uL — ABNORMAL LOW (ref 4.22–5.81)
RDW: 13.1 % (ref 11.5–15.5)
WBC: 10.4 10*3/uL (ref 4.0–10.5)
nRBC: 0 % (ref 0.0–0.2)

## 2020-09-15 LAB — GLUCOSE, CAPILLARY
Glucose-Capillary: 110 mg/dL — ABNORMAL HIGH (ref 70–99)
Glucose-Capillary: 162 mg/dL — ABNORMAL HIGH (ref 70–99)
Glucose-Capillary: 73 mg/dL (ref 70–99)
Glucose-Capillary: 76 mg/dL (ref 70–99)
Glucose-Capillary: 77 mg/dL (ref 70–99)
Glucose-Capillary: 97 mg/dL (ref 70–99)

## 2020-09-15 LAB — RENAL FUNCTION PANEL
Albumin: 2.3 g/dL — ABNORMAL LOW (ref 3.5–5.0)
Anion gap: 9 (ref 5–15)
BUN: 42 mg/dL — ABNORMAL HIGH (ref 8–23)
CO2: 21 mmol/L — ABNORMAL LOW (ref 22–32)
Calcium: 8.4 mg/dL — ABNORMAL LOW (ref 8.9–10.3)
Chloride: 111 mmol/L (ref 98–111)
Creatinine, Ser: 2.07 mg/dL — ABNORMAL HIGH (ref 0.61–1.24)
GFR, Estimated: 31 mL/min — ABNORMAL LOW (ref >60)
Glucose, Bld: 105 mg/dL — ABNORMAL HIGH (ref 70–99)
Phosphorus: 3 mg/dL (ref 2.5–4.6)
Potassium: 4.1 mmol/L (ref 3.5–5.1)
Sodium: 141 mmol/L (ref 135–145)

## 2020-09-15 IMAGING — DX DG ABD PORTABLE 1V
1 series · 1 of 1 positions shown · non-contrast
Comparison: None.

CLINICAL DATA: Feeding tube placement

EXAM:
PORTABLE ABDOMEN - 1 VIEW

[abdomen]
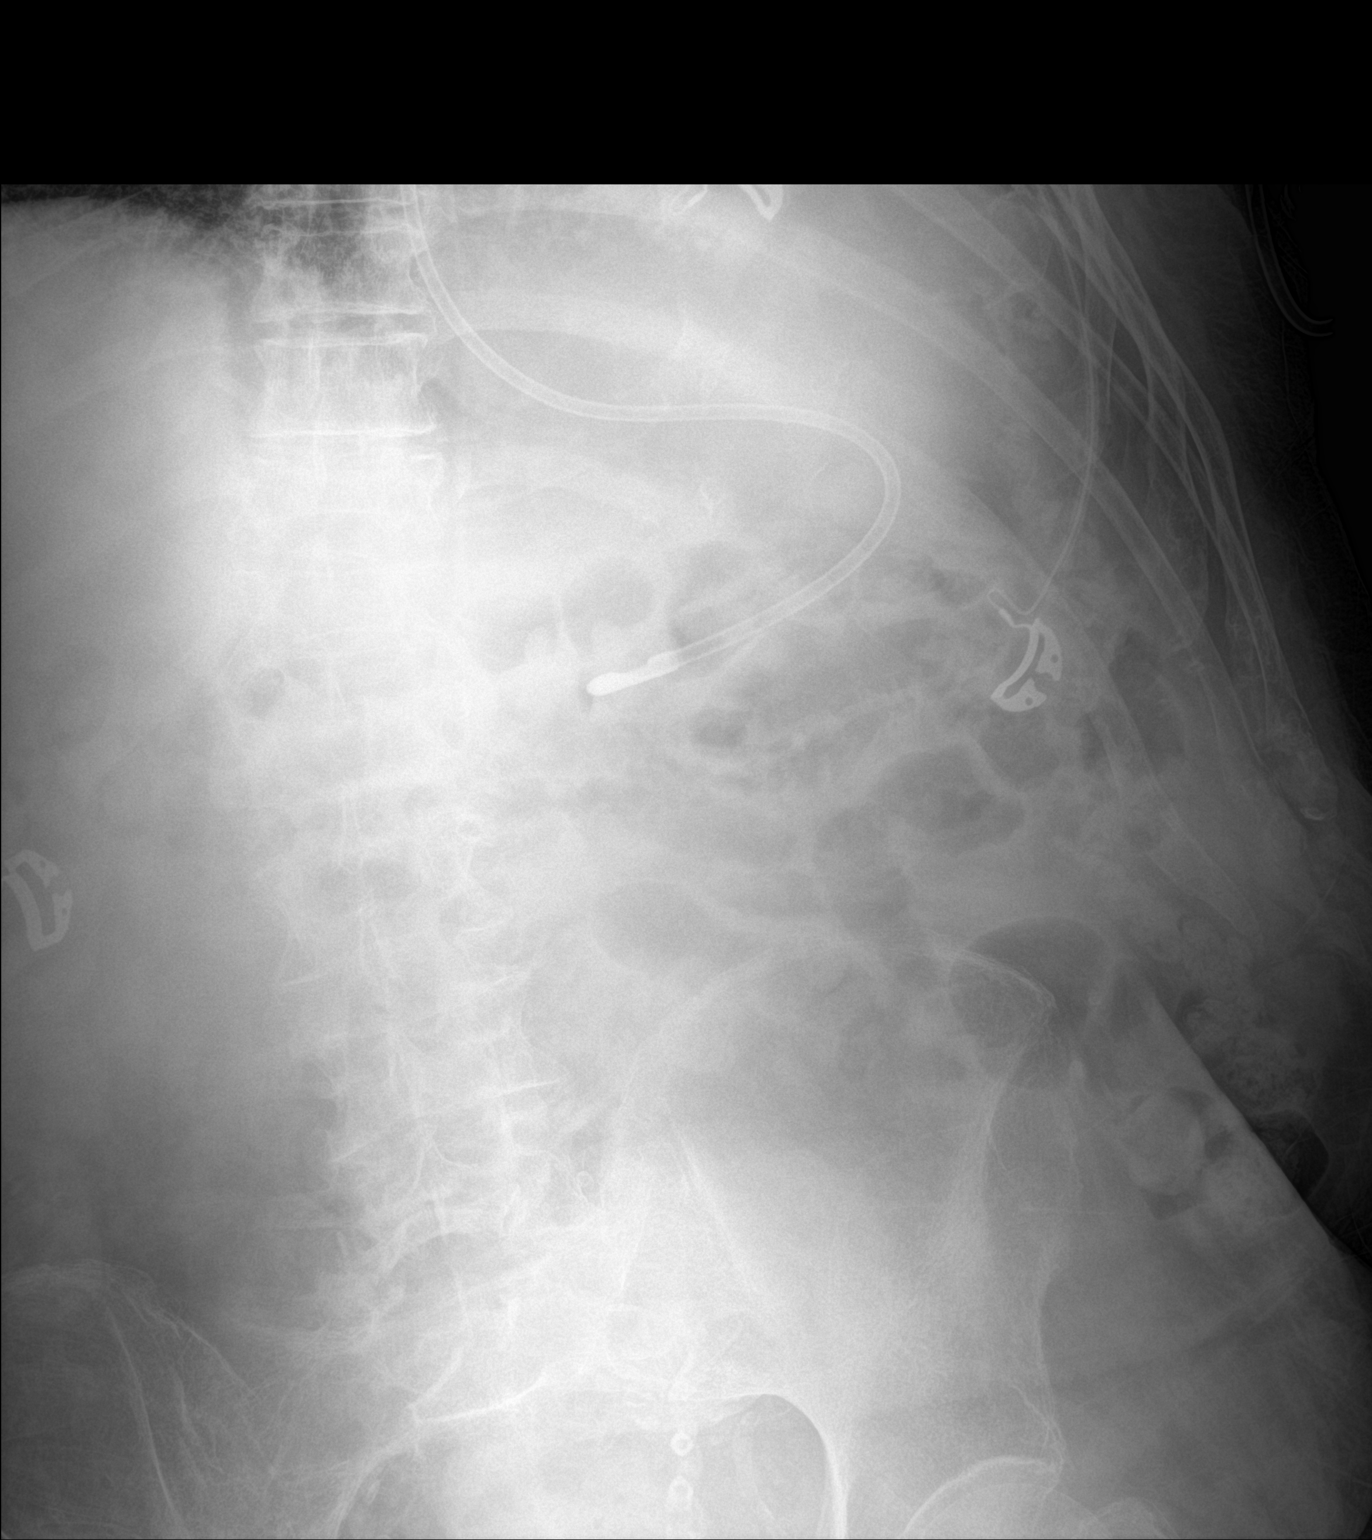

[1 of 1 positions shown; findings below may reference images not displayed]

FINDINGS: Feeding tube tip in the mid stomach. Nonobstructive bowel gas
pattern.
IMPRESSION: Feeding tube tip in the mid stomach.

## 2020-09-15 MED ORDER — METOPROLOL TARTRATE 5 MG/5ML IV SOLN: 2.5000 mg | INTRAVENOUS | Status: DC | PRN

## 2020-09-15 NOTE — Progress Notes (Signed)
RT note:  Pt placed on PS/CPAP 10/5 and is tolerating well. RT will continue to monitor.

## 2020-09-15 NOTE — Progress Notes (Addendum)
NAME:  Andre Adams, MRN:  [id number], DOB:  March 18, 1933, LOS: 4 ADMISSION DATE:  09/11/2020, CONSULTATION DATE:  7/23 REFERRING MD:  Selina Cooley MD, CHIEF COMPLAINT:   fall and left sided weakness   Brief HPI:   Goes by Andre Adams"  60 yoM with prior hx of HTN, HL, Afib off AC due to GIB HX, CKD3, frequent falls presented 7/23 form [facility] nursing home with left sided weakness with flaccid L dide arm/face/leg and was nonverbal.  LKW 1445. NIHSS score 23. TPA was not give due to fall with head trauma. CTA head showed R MCA stroke secondary to a R M2 occlusion. He was taken to [facility] with Dr. Corliss Skains.  Returned to ICU intubated, PCCM following.   Pertinent  Medical History  HTN, HL, Afib off AC due to GIB HX, CKD3, frequent falls  Significant Hospital Events: Including procedures, antibiotic start and stop dates in addition to other pertinent events   7/23 Admitted with acute R M2 occlusion s/p NIR 7/24 loaded with keppra for R sided twitching/ EEG, MAE to noxious stimuli, followed LE command for RN; off cleviprex gtt early am; weaned all day PSV 5/5, mental status barrier to extuation   Interim History / Subjective:  UOP -1,250 last 24 hours On PRVC on mech vent; weaned all day on PSV yesterday Afib rate controlled Able to lift head off pillow; stick tongue out; opens eyes weakly to command; MAE Oral Secretions still thick; scopolamine started yesterday Cortrak placed yesterday which was pulled out by patient overnight  Objective   Blood pressure (!) 126/94, pulse 78, temperature 98.4 F (36.9 C), temperature source Axillary, resp. rate 15, height 5\' 7"  (1.702 m), weight 68.1 kg, SpO2 100 %.    Vent Mode: PRVC FiO2 (%):  [30 %-40 %] 40 % Set Rate:  [16 bmp] 16 bmp Vt Set:  [520 mL] 520 mL PEEP:  [5 cmH20] 5 cmH20 Pressure Support:  [10 cmH20] 10 cmH20 Plateau Pressure:  [14 cmH20-16 cmH20] 15 cmH20   Intake/Output Summary (Last 24 hours) at 09/15/2020 0718 Last data filed  at 09/15/2020 09/17/2020 Gross per 24 hour  Intake 376 ml  Output 1250 ml  Net -874 ml    Filed Weights   09/11/20 1830 09/14/20 0239 09/15/20 0407  Weight: 68 kg 69.7 kg 68.1 kg   Examination: General:  critically ill appearing on mech vent HEENT: MM pink/moist; ETT in place Neuro: MAE to command; opens eyes occasionally to command; cough/gag reflex; PERRL CV: s1s2, afib rhythm with rate 90s, no m/r/g PULM:  dim clear BS bilaterally; on mech vent PSV 10/5 GI: soft, bsx4 active  Extremities: warm/dry, no edema  Skin: no rashes or lesions   Labs: Platelets 120 BUN 42; creat 2.07 from 2.17    Resolved Hospital Problem list   Concern for seizure: EEG no seizure; keppra stopped per neuro  Assessment & Plan:  R MCA stroke secondary to a R M2 occlusion S/P thrombectomy: likely related to Afib off AC given prior GIB 10/2019 P: -Neuro primary -continue ASA per neurology; will start DOAC 7-10 days post stroke -SBP goal <160; continue lopressor -statin -PT/OT  Acute respiratory insufficiency related to above  P: -continue mech vent support PSV -VAP prevention in place -minimize sedating meds for RASS 0 -wean fio2 for sats >92% -ordered scopolamine patch for excessive oral secretions prior to extubation; consider giving robinul prior to extubation if oral secretions persist  -GOC today pending -consider palliative care  DM2 P: -  SSI an CBG -continue holding basal insulin this morning -consider placing cor trak today to allow tube feeding  HX Atrial fibrillation off AC due to GIB hx Afib on EKG P: -continue lopressor -ASA for [facility]; neuro recommends starting DOAC 7-10 days post stroke  CKD3 on AKI Creat 2.50 on admission. Unclear baseline. Improved with fluids P: -Trend BMP/Mag/UOP -continue doxazosin -avoid nephrotoxic agents; renal dose meds   Best Practice (right click and "Reselect all SmartList Selections" daily)   Diet/type: NPO; TF  DVT prophylaxis: SCD;  lovenox (monitor renal function closely)  GI prophylaxis: PPI Lines: N/A Foley:  N/A; monitor for urinary retention  Code Status:  full code Last date of multidisciplinary goals of care discussion: Patient's wife updated at bedside this morning, Dr. Roda Shutters also at bedside for discussion.  I did relay my concerns with extubation, air way protection, high risk for aspiration and reintubation at this time.  We did approach GOCs.  Patient has a living will (from 95) being scanned on the chart, but wife needs some time to think and discuss with their children, but most likely, she does not want reintubation when we extubate.  Given his neuro exam has improved some over the last 24 hours, but yet not quite there, and we are trying to minimize his sedation given his reported sensitivities to optimize his best chance for likely a one-way extubation.  Will defer extubation today and hopefully he will continue to improve his mental status and chances for a successful extubation.   7/26: GOC discussion with wife and daughter at bedside. Daughter was not aware of discussion yesterday about no trach and no reintubation. Also discussed options for DNR. They currently want to think about this all since this is new information before they make a decision. Will re-evaluate later this afternoon to see if any decisions have been made.   Critical care time: 35 mins      JD Anselm Lis [facility] Pulmonary & Critical [facility] 09/15/2020, 7:18 AM  Please see [email or web address] for pager details.  From 7A-7P if no response, please call [phone]. After hours, please call ELink [phone].

## 2020-09-15 NOTE — Progress Notes (Signed)
OT Cancellation Note  Patient Details Name: Andre Adams MRN: [id number] DOB: Sep 15, 1933   Cancelled Treatment:    Reason Eval/Treat Not Completed: Patient at procedure or test/ unavailable (placing cortrak currently and potential extubation pending with one way)  Wynona Neat, OTR/L  Acute Rehabilitation Services Pager: [phone] Office: [phone] .  09/15/2020, 2:23 PM

## 2020-09-15 NOTE — Progress Notes (Signed)
PT Cancellation Note  Patient Details Name: Andre Adams MRN: [id number] DOB: 02-14-1934   Cancelled Treatment:    Reason Eval/Treat Not Completed: Patient not medically ready (potential extubation today; currently getting Cortrak placed).  Lillia Pauls, PT, DPT Acute Rehabilitation Services Pager [phone] Office [phone]    Norval Morton 09/15/2020, 2:22 PM

## 2020-09-15 NOTE — Progress Notes (Signed)
STROKE TEAM PROGRESS NOTE   INTERVAL HISTORY Wife at bedside.  Patient still intubated, lethargic, however able to open eyes on voice, able to maintain eye opening for longer period time than yesterday.  Still follow commands around right arm in the bilateral feet.  Did not follow commands on the left hand however, able to lift left arm against gravity.   OBJECTIVE Vitals:   09/15/20 0600 09/15/20 0700 09/15/20 0754 09/15/20 0800  BP: (!) 122/16 (!) 126/94 (!) 126/94 104/75  Pulse: 85 78 83 84  Resp: Temp:      TempSrc:      SpO2: 100% 100% 100% 100%  Weight:      Height:        CBC:  Recent Labs  Lab 09/14/20 0605 09/15/20 0248  WBC 10.1 10.4  HGB 12.3* 12.3*  HCT 37.7* 38.2*  MCV 98.4 101.1*  PLT 121* 120*    Basic Metabolic Panel:  Recent Labs  Lab 09/14/20 0605 09/15/20 0248  NA 141 141  K 4.0 4.1  CL 110 111  CO2 22 21*  GLUCOSE 112* 105*  BUN 40* 42*  CREATININE 2.17* 2.07*  CALCIUM 8.3* 8.4*  MG 1.8  --   PHOS 3.4 3.0    Lipid Panel:     Component Value Date/Time   CHOL 147 09/12/2020 0417   TRIG 92 09/13/2020 0540   HDL 27 (L) 09/12/2020 0417   CHOLHDL 5.4 09/12/2020 0417   VLDL 20 09/12/2020 0417   LDLCALC 100 (H) 09/12/2020 0417   HgbA1c:  Lab Results  Component Value Date   HGBA1C 7.8 (H) 09/12/2020   Urine Drug Screen: No results found for: LABOPIA, COCAINSCRNUR, LABBENZ, AMPHETMU, THCU, LABBARB  Alcohol Level     Component Value Date/Time   ETH <10 09/11/2020 1845    IMAGING  DG Abd 1 View  Result Date: 09/14/2020 CLINICAL DATA:  Feeding tube placement EXAM: ABDOMEN - 1 VIEW fluoro time: FLUOROSCOPY TIME:  1 minute, 12 seconds; 6.8 mGy COMPARISON:  None. FINDINGS: Enteric tube overlies the left upper quadrant. Injected air is within the stomach lumen confirming placement. IMPRESSION: Enteric tube within the stomach. Electronically Signed   By: Guadlupe Spanish M.D.   On: 09/14/2020 18:56     DG Wrist Complete  Left  Result Date: 09/11/2020 CLINICAL DATA:  fall EXAM: LEFT HAND - COMPLETE 3+ VIEW; LEFT WRIST - COMPLETE 3+ VIEW COMPARISON:  December 15, 2019 common March 13, 2020 FINDINGS: Revisualization of the sequela of an impacted fracture of the distal LEFT radius and ulna. There is mature osseous bridging with scattered residual areas of lucency from prior fracture sites. Fracture fragments are in unchanged alignment. No definitive acute fracture. Vascular calcifications. Osteopenia. Degenerative changes throughout the DIPs and PIPs. IMPRESSION: Revisualization of sequela of prior impacted fracture of the distal radius and ulna. Evaluation for superimposed acute fracture is limited due to osteopenia and underlying chronic osseous remodeling. No definitive superimposed acute fracture is noted. If persistent clinical concern for scaphoid fracture, recommend immobilization and follow-up radiographs in 2 weeks versus MRI. Electronically Signed   By: Meda Klinefelter MD   On: 09/11/2020 19:17   CT HEAD WO CONTRAST  Result Date: 09/12/2020 CLINICAL DATA:  Status post thrombectomy EXAM: CT HEAD WITHOUT CONTRAST TECHNIQUE: Contiguous axial images were obtained from the base of the skull through the vertex without intravenous contrast. COMPARISON:  None. FINDINGS: Brain: There is hyperdensity over the right MCA territory, likely contrast  staining. There is generalized atrophy without lobar predilection. There is periventricular hypoattenuation compatible with chronic microvascular disease. Vascular: No abnormal hyperdensity of the major intracranial arteries or dural venous sinuses. No intracranial atherosclerosis. Skull: The visualized skull base, calvarium and extracranial soft tissues are normal. Sinuses/Orbits: No fluid levels or advanced mucosal thickening of the visualized paranasal sinuses. No mastoid or middle ear effusion. The orbits are normal. IMPRESSION: Hyperdensity over the right MCA territory, likely  contrast staining. Electronically Signed   By: Deatra Robinson M.D.   On: 09/12/2020 02:55   CT Cervical Spine Wo Contrast  Result Date: 09/11/2020 CLINICAL DATA:  Facial trauma. EXAM: CT CERVICAL SPINE WITHOUT CONTRAST TECHNIQUE: Multidetector CT imaging of the cervical spine was performed without intravenous contrast. Multiplanar CT image reconstructions were also generated. COMPARISON:  None. FINDINGS: Alignment: No evidence of acute traumatic subluxation. Skull base and vertebrae: No acute cervical spine fracture or suspicious osseous lesion. Mild superior endplate compression fracture at T3, likely chronic. Soft tissues and spinal canal: No prevertebral fluid or swelling. No visible canal hematoma. Disc levels: Moderate to severe disc space narrowing from C4-5 to C6-7. Severe bilateral facet arthrosis at C3-4 with small subchondral cysts or erosions and joint widening on the right. Right facet ankylosis at C4-5. No evidence of high-grade spinal stenosis or high-grade neural foraminal stenosis. Upper chest: No apical lung consolidation or mass. Other: Subcentimeter thyroid nodules for which no imaging follow-up is recommended. Carotid atherosclerosis. IMPRESSION: 1. No acute cervical spine fracture. 2. Advanced cervical disc and facet degeneration. Electronically Signed   By: Sebastian Ache M.D.   On: 09/11/2020 19:01   DG Pelvis Portable  Result Date: 09/11/2020 CLINICAL DATA:  trauma EXAM: PORTABLE PELVIS 1-2 VIEWS COMPARISON:  December 15, 2019 FINDINGS: Osteopenia. Excreted contrast limits evaluation of the sacrum. No acute displaced fracture seen on single view. No pelvic diastasis. Vascular calcifications. Degenerative changes of the lumbar spine. IMPRESSION: No acute fracture on single view. If persistent concern for nondisplaced hip or pelvic fracture, recommend dedicated pelvic MRI. Electronically Signed   By: Meda Klinefelter MD   On: 09/11/2020 19:13   Portable Chest x-ray  Result Date:  09/11/2020 CLINICAL DATA:  87 year old male status post intubation. EXAM: PORTABLE CHEST 1 VIEW COMPARISON:  Earlier radiograph dated 09/11/2020. FINDINGS: Interval placement of an endotracheal tube with tip approximately 6 cm above the carina. Enteric tube with side-port in the distal esophagus and tip close to the GE junction. Recommend further advancing of the enteric tube by at least additional 10 cm. Diffuse interstitial coarsening and left lung base atelectasis similar to prior radiograph. Stable cardiomediastinal silhouette. Atherosclerotic calcification of the aorta. No acute osseous pathology. IMPRESSION: 1. Endotracheal tube above the carina. 2. Enteric tube with side-port in the distal esophagus and tip close to the GE junction. Recommend further advancing of the enteric tube by at least 10 cm. Electronically Signed   By: Elgie Collard M.D.   On: 09/11/2020 23:08   DG Chest Portable 1 View  Result Date: 09/11/2020 CLINICAL DATA:  trauma EXAM: PORTABLE CHEST 1 VIEW COMPARISON:  October 23, 2019, October 03, 2019 FINDINGS: Evaluation is limited secondary to patient rotation. The cardiomediastinal silhouette is grossly unchanged in contour. No pleural effusion. No pneumothorax. Diffuse interstitial prominence. Visualized abdomen is unremarkable. Compression fracture deformity of the T11, unchanged in comparison to prior. Compression fracture of T9 appears similar comparison to prior. IMPRESSION: Diffuse interstitial prominence likely reflecting underlying pulmonary edema. Differential considerations include atypical infection. Electronically Signed  By: Meda Klinefelter MD   On: 09/11/2020 19:12   DG Hand Complete Left  Result Date: 09/11/2020 CLINICAL DATA:  fall EXAM: LEFT HAND - COMPLETE 3+ VIEW; LEFT WRIST - COMPLETE 3+ VIEW COMPARISON:  December 15, 2019 common March 13, 2020 FINDINGS: Revisualization of the sequela of an impacted fracture of the distal LEFT radius and ulna. There is  mature osseous bridging with scattered residual areas of lucency from prior fracture sites. Fracture fragments are in unchanged alignment. No definitive acute fracture. Vascular calcifications. Osteopenia. Degenerative changes throughout the DIPs and PIPs. IMPRESSION: Revisualization of sequela of prior impacted fracture of the distal radius and ulna. Evaluation for superimposed acute fracture is limited due to osteopenia and underlying chronic osseous remodeling. No definitive superimposed acute fracture is noted. If persistent clinical concern for scaphoid fracture, recommend immobilization and follow-up radiographs in 2 weeks versus MRI. Electronically Signed   By: Meda Klinefelter MD   On: 09/11/2020 19:17   CT HEAD CODE STROKE WO CONTRAST  Result Date: 09/11/2020 CLINICAL DATA:  Code stroke. Neuro deficit, acute, stroke suspected. EXAM: CT HEAD WITHOUT CONTRAST TECHNIQUE: Contiguous axial images were obtained from the base of the skull through the vertex without intravenous contrast. COMPARISON:  09/27/2019 FINDINGS: Brain: There is no evidence of an acute infarct, intracranial hemorrhage, mass, midline shift, or extra-axial fluid collection. There is mild-to-moderate cerebral atrophy. Patchy hypodensities in the cerebral white matter bilaterally are similar to the prior motion degraded CT and are nonspecific but compatible with moderate chronic small vessel ischemic disease. A chronic infarct is again noted in the left basal ganglia. There is also a small chronic right cerebellar infarct. Vascular: Calcified atherosclerosis at the skull base. No hyperdense vessel. Skull: No fracture or suspicious osseous lesion. Sinuses/Orbits: Paranasal sinuses and mastoid air cells are clear. Bilateral cataract extraction. Other: None. ASPECTS [facility] Stroke Program Early CT Score) - Ganglionic level infarction (caudate, lentiform nuclei, internal capsule, insula, M1-M3 cortex): 7 - Supraganglionic infarction (M4-M6  cortex): 3 Total score (0-10 with 10 being normal): 10 IMPRESSION: 1. No evidence of acute intracranial abnormality. 2. ASPECTS is 10. 3. Moderate chronic small vessel ischemic disease. These results were communicated to Dr. Selina Cooley at 6:21 pm on 09/11/2020 by text page via the [facility] messaging system. Electronically Signed   By: Sebastian Ache M.D.   On: 09/11/2020 18:23   CT ANGIO HEAD NECK W WO CM W PERF (CODE STROKE)  Result Date: 09/11/2020 CLINICAL DATA:  Neuro deficit, acute, stroke suspected. EXAM: CT ANGIOGRAPHY HEAD AND NECK CT PERFUSION BRAIN TECHNIQUE: Multidetector CT imaging of the head and neck was performed using the standard protocol during bolus administration of intravenous contrast. Multiplanar CT image reconstructions and MIPs were obtained to evaluate the vascular anatomy. Carotid stenosis measurements (when applicable) are obtained utilizing NASCET criteria, using the distal internal carotid diameter as the denominator. Multiphase CT imaging of the brain was performed following IV bolus contrast injection. Subsequent parametric perfusion maps were calculated using RAPID software. CONTRAST:  59mL OMNIPAQUE IOHEXOL 350 MG/ML SOLN COMPARISON:  None. FINDINGS: CTA NECK FINDINGS Aortic arch: Normal variant aortic arch branching pattern with common origin of the brachiocephalic and left common carotid arteries. Mild atherosclerotic plaque without arch vessel origin stenosis. Right carotid system: Patent with predominantly calcified plaque at the carotid bifurcation. No evidence of a significant stenosis or dissection. Left carotid system: Patent with mixed calcified and soft plaque at the carotid bifurcation. No evidence of a significant stenosis or dissection. Vertebral arteries: Patent  with the left being mildly dominant. Scattered atherosclerosis bilaterally resulting in severe proximal V1 and mild V3 stenoses on the left. Skeleton: See separate cervical spine CT report. Other neck: Subcentimeter  thyroid nodules for which no imaging follow-up is recommended. Upper chest: Mild motion artifact. No apical lung consolidation or mass. Review of the MIP images confirms the above findings CTA HEAD FINDINGS Anterior circulation: The internal carotid arteries are patent from skull base to carotid termini with atherosclerotic plaque resulting in moderate bilateral paraclinoid stenoses. The right M1 segment is patent, however there is a proximal M2 occlusion without significant distal reconstitution. The left MCA is patent with a mild M1 stenosis noted. The ACAs are patent with mild-to-moderate stenosis of the left A1 segment as well as moderate irregular narrowing of both A2 segments. No aneurysm is identified. Posterior circulation: The intracranial vertebral arteries are patent to the basilar with atherosclerosis resulting in irregularity bilaterally and a moderate stenosis on the right. The basilar artery is patent with a severe stenosis in its midportion. The right PCA is patent with a severe stenosis near the P1-P2 junction. There are severe stenoses of both PCAs near the P1-P2 junctions with poor flow distally, particularly on the left. No aneurysm is identified. Venous sinuses: As permitted by contrast timing, patent. Anatomic variants: None. Review of the MIP images confirms the above findings CT Brain Perfusion Findings: ASPECTS: 10 CBF (<30%) Volume: 40 mL Perfusion (Tmax>6.0s) volume: 184 mL Mismatch Volume: 144 mL Infarction Location: Right frontal and temporal lobes primarily at the level of the operculum (MCA territory) IMPRESSION: 1. Proximal right M2 occlusion. 2. CTP demonstrates a right MCA core infarct with large penumbra as detailed above. 3. Advanced intracranial atherosclerosis including moderate bilateral ICA stenoses, a severe basilar artery stenosis, and severe bilateral proximal PCA stenoses. 4. Cervical carotid atherosclerosis without significant stenosis. 5. Severe proximal left vertebral  artery stenosis. Emergent findings were communicated to Dr. Selina CooleyStack at 6:35 pm on 09/11/2020 by text page via the [facility] messaging system. Electronically Signed   By: Sebastian AcheAllen  Grady M.D.   On: 09/11/2020 18:51   CT Maxillofacial Wo Contrast  Result Date: 09/11/2020 CLINICAL DATA:  Facial trauma. EXAM: CT MAXILLOFACIAL WITHOUT CONTRAST TECHNIQUE: Multidetector CT imaging of the maxillofacial structures was performed. Multiplanar CT image reconstructions were also generated. COMPARISON:  Head CT 09/27/2019 FINDINGS: Osseous: No acute fracture, mandibular dislocation, or destructive osseous process. Orbits: Bilateral cataract extraction and proptosis. No orbital hematoma or mass. Sinuses: Paranasal sinuses and mastoid air cells are clear. Mild rightward nasal septal deviation. Soft tissues: Punctate left parotid calcification.  Atherosclerosis. Limited intracranial: More fully evaluated on separate head CT. IMPRESSION: No acute maxillofacial fracture. Electronically Signed   By: Sebastian AcheAllen  Grady M.D.   On: 09/11/2020 18:55    ECG - atrial fibrillation - ventricular response 94 BPM (See cardiology reading for complete details)  PHYSICAL EXAM  Temp:  [97.4 F (36.3 C)-98.8 F (37.1 C)] 98.4 F (36.9 C) (07/27 0400) Pulse Rate:  [73-98] 84 (07/27 0800) Resp:  [12-18] 14 (07/27 0800) BP: (104-132)/(16-95) 104/75 (07/27 0800) SpO2:  [100 %] 100 % (07/27 0800) FiO2 (%):  [30 %-40 %] 30 % (07/27 0754) Weight:  [68.1 kg] 68.1 kg (07/27 0407)  General - Well nourished, well developed, intubated off sedation.  Ophthalmologic - fundi not visualized due to noncooperation.  Cardiovascular - irregularly irregular heart rate and rhythm.  Neuro - intubated off sedation, eyes open to voice, able to follow simple commands on the right hand  and b/l feet. With eye opening, eyes in mid position, chronic left eye propotosis, not blinking to visual threat, not tracking, PERRL.  gag and cough present. Breathing over the vent.   Facial symmetry not able to test due to ET tube.  Tongue protrusion not cooperative. BUEs and BLEs spontaneous movement, BUE against gravity at least 3+/5, BLEs able to move spontaneously 3-/5, not against gravity. No babinski. Sensation, coordination and gait not tested.   ASSESSMENT/PLAN Mr. Andre Adams is a 87 y.o. male with history of DM2, CKD stage 3a, HTN, HL, hx a fib off AC 2/2 hx GIB, mild cognitive impairment and frequent falls who presented after a fall, non verbal and L sided weakness at ALF. He did not receive IV t-PA due to head trauma with fall. The pt was taken to IR for intervention for thrombectomy Rt M2 lesion.  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 CT Head - No evidence of acute intracranial abnormality.  CTA H&N - Proximal right M2 occlusion. Advanced intracranial atherosclerosis including moderate bilateral ICA stenoses, a severe basilar artery stenosis, and severe bilateral proximal PCA stenoses.  CTP 40/144 cc, positive for penumbra MRI brain - right MCA large infarct with petechial hemorrhage MRA head - patent right M2 2D Echo EF 65-70% LDL - 100 HgbA1c 7.8 VTE prophylaxis -Lovenox No antithrombotics prior to admission, now on aspirin 325. Ongoing aggressive stroke risk factor management Therapy recommendations:  pending Disposition:  Pending  Chronic Afib Was on Coumadin in the past Coumadin discontinued in 10/2019 due to GI bleeding Follow-up with cardiology, wife and pt declined watchman device  Now on ASA Will consider AC with eliquis in 7 to 10 days post stroke (see below)  Hx of GIB Per discharge in 10/2019 "Of note, patient had recent GI bleed last month with no source of bleeding found on colonoscopy although did have a polypack to me for multiple submucosal polyps. Was plan for further polyp removal outpatient but has not done so yet. Patient had  episode of large-volume hematochezia on 10/26/2019. GI was consulted and originally reccommended observation. Serial CBCs were obtained with downtrending Hgbs, GI performed colonoscopy on 10/29/19 which revealed large fecal impaction with underlying deep ulcers in the rectum (nonbleeding) consistent with stercoral ulcers. This was thought to be the cause of the bleeding. Large borders solid stool was removed in combination with digital disimpaction, irrigation, and snare and rat-tooth forceps. Patient received Fleet enema x1 and increase MiraLAX daily 34 mg with continuing daily fiber supplementation. Important to maintain a daily aggressive bowel regimen ondischarge. Do not feel above GI findings are contraindicated for [facility], especially now pt had stroke due to not on [facility] Will consider eliquis in 7-10 days post stroke Continue ASA for now  Respiratory failure Intubated for procedure, still intubated post procedure due to mental status and not able to protect airway CCM on board Off sedation, on weaning now Extubate as able  ? Seizure-like activity Post procedure twitching of right arm and foot noted NOT fit to pt right sided infarct Pt loaded with Keppra x 1, on keppra 500mg  bid -> d/c due to drowsy sleepy LTM EEG no seizure -> d/c  Hypertension Stable at the low end Off IVF On metoprolol and doxazosin Long-term BP goal normotensive  Hyperlipidemia Home Lipid lowering medication: unknown LDL 100, goal < 70 Current lipid lowering medication: lipitor 40 Continue statin at discharge  Uncontrolled Diabetes Home diabetic meds: unknown Uncontrolled  On lantus SSI  CBG monitoring HgbA1c - 7.8, goal < 7.0 PCP close follow up   Other Stroke Risk Factors Advanced age  Other Active Problems Code status - Full code CKD - stage 3b - Cre - 2.5->2.27->2.17->2.07 Chronic fractures related to fall - hip, pelvic and LEFT radius and ulna fxs  Mild leukocytosis - WBCs - 11.4 ->10.1->10.4  (afebrile) Frequent falls (2 last year, 1 this year)   Hospital day # 4  This patient is critically ill due to large right MCA stroke, A. fib not on AC, seizure-like activity, respiratory failure and at significant risk of neurological worsening, death form recurrent stroke, heart failure, status epilepticus, respiratory failure, encephalopathy. This patient's care requires constant monitoring of vital signs, hemodynamics, respiratory and cardiac monitoring, review of multiple databases, neurological assessment, discussion with family, other specialists and medical decision making of high complexity. I spent 40 minutes of neurocritical care time in the care of this patient. I had long discussion with wife at bedside, updated pt current condition, treatment plan and potential prognosis, and answered all the questions.  I encouraged her to discuss with daughter for decision on plan if patient not tolerating extubation. She expressed understanding and appreciation.   Marvel Plan, MD PhD Stroke Neurology 09/15/2020 3:26 PM   To contact Stroke Continuity provider, please refer to [email or web address]. After hours, contact General Neurology

## 2020-09-15 NOTE — Progress Notes (Signed)
  Interdisciplinary Goals of Care Family Meeting   Date carried out:: 09/15/2020  Location of the meeting: Conference room  Member's involved: Nurse Practitioner, Family Member or next of kin, and Other: PA-C  Durable Power of Insurance risk surveyor: Wife    Discussion: We discussed goals of care for [employer] .  Wife, daughter, and granddaughter present for the meeting. Posey Boyer and myself spoke with family regarding goals of care regarding Jayshawn Colston. Discussed with family that he is a high risk for reintubation if we extubate given his prognosis and thick secretions. Discussed with family about their wishes if they would want the patient to receive intubation if we were to extubate and if they would want tracheostomy. Also discussed that [name] would be receiving rehab and possibly a long recovery. Family decided that for now they would like everything done at this time and they will re-evaluate their decision at a later time if [facility] were to worsen.   Code status: Full Code  Disposition: Continue current acute care  Time spent for the meeting: 15 minutes  JD Anselm Lis [facility] 09/15/2020, 2:38 PM  Please see [email or web address] for pager details.  From 7A-7P if no response, please call [phone]. After hours, please call ELink [phone].

## 2020-09-15 NOTE — Procedures (Signed)
Cortrak  Person Inserting Tube:  Damisha Wolff, Verdon Cummins, RD Tube Type:  Cortrak - 43 inches Tube Size:  10 Tube Location:  Right nare Initial Placement:  Stomach Secured by: Bridle Technique Used to Measure Tube Placement:  Marking at nare/corner of mouth Cortrak Secured At:  61 cm Cortrak Tube Team Note:  Consult received to place a Cortrak feeding tube.   X-ray is required, abdominal x-ray has been ordered by the Cortrak team. Please confirm tube placement before using the Cortrak tube.   If the tube becomes dislodged please keep the tube and contact the Cortrak team at [email or web address] ([email or web address]) for replacement.  If after hours and replacement cannot be delayed, place a NG tube and confirm placement with an abdominal x-ray.     Eugene Gavia, MS, RD, LDN (she/her/hers) RD pager number and weekend/on-call pager number located in Amion.

## 2020-09-15 NOTE — Progress Notes (Signed)
SLP Cancellation Note  Patient Details Name: Andre Adams MRN: [id number] DOB: 1933/08/13   Cancelled treatment:       Reason Eval/Treat Not Completed: Patient not medically ready (remains on vent this am). Will f/u as able.     Mahala Menghini., M.A. CCC-SLP Acute Rehabilitation Services Pager [phone] Office [phone]  09/15/2020, 7:39 AM

## 2020-09-16 DIAGNOSIS — I639 Cerebral infarction, unspecified: Secondary | ICD-10-CM | POA: Diagnosis not present

## 2020-09-16 DIAGNOSIS — N179 Acute kidney failure, unspecified: Secondary | ICD-10-CM | POA: Diagnosis not present

## 2020-09-16 DIAGNOSIS — I63511 Cerebral infarction due to unspecified occlusion or stenosis of right middle cerebral artery: Secondary | ICD-10-CM | POA: Diagnosis not present

## 2020-09-16 DIAGNOSIS — Z978 Presence of other specified devices: Secondary | ICD-10-CM | POA: Diagnosis not present

## 2020-09-16 DIAGNOSIS — I482 Chronic atrial fibrillation, unspecified: Secondary | ICD-10-CM | POA: Diagnosis not present

## 2020-09-16 LAB — CBC
HCT: 38.9 % — ABNORMAL LOW (ref 39.0–52.0)
Hemoglobin: 12.5 g/dL — ABNORMAL LOW (ref 13.0–17.0)
MCH: 32.6 pg (ref 26.0–34.0)
MCHC: 32.1 g/dL (ref 30.0–36.0)
MCV: 101.3 fL — ABNORMAL HIGH (ref 80.0–100.0)
Platelets: 154 10*3/uL (ref 150–400)
RBC: 3.84 MIL/uL — ABNORMAL LOW (ref 4.22–5.81)
RDW: 13.1 % (ref 11.5–15.5)
WBC: 10.1 10*3/uL (ref 4.0–10.5)
nRBC: 0 % (ref 0.0–0.2)

## 2020-09-16 LAB — GLUCOSE, CAPILLARY
Glucose-Capillary: 254 mg/dL — ABNORMAL HIGH (ref 70–99)
Glucose-Capillary: 225 mg/dL — ABNORMAL HIGH (ref 70–99)
Glucose-Capillary: 175 mg/dL — ABNORMAL HIGH (ref 70–99)
Glucose-Capillary: 155 mg/dL — ABNORMAL HIGH (ref 70–99)
Glucose-Capillary: 186 mg/dL — ABNORMAL HIGH (ref 70–99)
Glucose-Capillary: 185 mg/dL — ABNORMAL HIGH (ref 70–99)

## 2020-09-16 LAB — RENAL FUNCTION PANEL
Albumin: 2.4 g/dL — ABNORMAL LOW (ref 3.5–5.0)
Anion gap: 6 (ref 5–15)
BUN: 46 mg/dL — ABNORMAL HIGH (ref 8–23)
CO2: 24 mmol/L (ref 22–32)
Calcium: 8.5 mg/dL — ABNORMAL LOW (ref 8.9–10.3)
Chloride: 111 mmol/L (ref 98–111)
Creatinine, Ser: 2.07 mg/dL — ABNORMAL HIGH (ref 0.61–1.24)
GFR, Estimated: 31 mL/min — ABNORMAL LOW (ref >60)
Glucose, Bld: 270 mg/dL — ABNORMAL HIGH (ref 70–99)
Phosphorus: 3.3 mg/dL (ref 2.5–4.6)
Potassium: 4.3 mmol/L (ref 3.5–5.1)
Sodium: 141 mmol/L (ref 135–145)

## 2020-09-16 LAB — TRIGLYCERIDES: Triglycerides: 94 mg/dL (ref <150)

## 2020-09-16 LAB — MAGNESIUM: Magnesium: 2.2 mg/dL (ref 1.7–2.4)

## 2020-09-16 MED ORDER — INSULIN ASPART 100 UNIT/ML IJ SOLN
2.0000 [IU] | INTRAMUSCULAR | Status: DC
  Administered 2020-09-16 – 2020-09-17 (×6): 2 [IU] via SUBCUTANEOUS

## 2020-09-16 NOTE — Evaluation (Signed)
SLP Cancellation Note  Patient Details Name: Andre Adams MRN: [id number] DOB: [birth year]   Cancelled treatment:       Reason Eval/Treat Not Completed: Other (comment) (pt)  Rolena Infante, MS [facility] SLP Acute Rehab Services Office [phone] Pager [phone]  Chales Abrahams 09/16/2020, 7:45 AM

## 2020-09-16 NOTE — Evaluation (Signed)
Physical Therapy Evaluation Patient Details Name: Andre Adams MRN: [id number] DOB: December 06, 1933 Today's Date: 09/16/2020   History of Present Illness  Pt is an 87 y.o. male who presented from an ALF on 09/11/20 s/p fall with L-sided weakness. NIHSS = 20. No tPA administered 2/2 fall with head trauma. 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 PMH: DM2, CKD stage 3a, HTN, HL, hx a fib off AC 2/2 hx GIB   Clinical Impression  Pt presents with condition above and deficits mentioned below, see PT Problem List. PTA, he was living at an ALF, ambulating with a RW with mod I - SUP and needing assistance for ADL's. Family reports the last time he fell prior to this time was about 1 year ago. Pt's BP was low this date and he was still intubated, thus deferred from EOB or OOB mobility, but rather elevated HOB to obtain semi-chair like position. Pt was able to lift his head and rotate it to the R, but he tends to rest with his neck laterally flexed and rotated to the L, propped up on his L UE. Pt demonstrating gross weakness, primarily on his L side, but an ability to lift all extremities against gravity. Pt follows simple cues, but with a delay, especially on his L side. Recommending follow-up with skilled PT in a SNF setting to maximize his safety and independence with all functional mobility. Will continue to follow acutely.    Follow Up Recommendations SNF    Equipment Recommendations  Other (comment) (defer to post acute venue)    Recommendations for Other Services       Precautions / Restrictions Precautions Precautions: Fall Precaution Comments: ETT; bil mittens; monitor vitals Restrictions Weight Bearing Restrictions: No      Mobility  Bed Mobility Overal bed mobility: Needs Assistance Bed Mobility: Rolling Rolling: Mod assist         General bed mobility comments: Deferred EOB mobility due to low BP, thus monitored  vitals with changes in bed position with BP increasing as HOB increased. Pt automatically attempts to reposition self to prop his head on his L UE, wife reports he does this at baseline.    Transfers                 General transfer comment: Deferred due to low BP  Ambulation/Gait             General Gait Details: Deferred this date  Stairs            Wheelchair Mobility    Modified Rankin (Stroke Patients Only) Modified Rankin (Stroke Patients Only) Pre-Morbid Rankin Score: Moderate disability Modified Rankin: Severe disability     Balance                                             Pertinent Vitals/Pain Pain Assessment: Faces Faces Pain Scale: Hurts little more Pain Location: grimacing with ETT Pain Descriptors / Indicators: Grimacing Pain Intervention(s): Limited activity within patient's tolerance;Monitored during session;Repositioned    Home Living Family/patient expects to be discharged to:: Assisted living               Home Equipment: Walker - 2 wheels;Wheelchair - manual      Prior Function Level of Independence: Needs assistance   Gait / Transfers Assistance Needed: Pt  was able to ambulate in his room with mod I using the RW and was ambulating in hallways with supervision.  ADL's / Homemaking Assistance Needed: Pt needed assistance for ADLs.  Comments: Family reports most recent fall prior to this one was last year.     Hand Dominance   Dominant Hand: Right    Extremity/Trunk Assessment   Upper Extremity Assessment Upper Extremity Assessment: Defer to OT evaluation    Lower Extremity Assessment Lower Extremity Assessment: Generalized weakness;LLE deficits/detail LLE Deficits / Details: decreased knee extension PROM (limited by ~10 degrees), likely baseline though; decreased speed with initiation of movement compared to R; generalized weakness LLE Coordination: decreased gross motor    Cervical / Trunk  Assessment Cervical / Trunk Assessment: Kyphotic (with L neck lateral flexion/rotation in resting)  Communication   Communication: Other (comment) (intubated)  Cognition Arousal/Alertness: Lethargic Behavior During Therapy: Restless;Flat affect Overall Cognitive Status: Impaired/Different from baseline Area of Impairment: Attention;Memory;Following commands;Safety/judgement;Awareness;Problem solving;Rancho level               Rancho Levels of Cognitive Functioning Rancho Los Amigos Scales of Cognitive Functioning: Confused/appropriate   Current Attention Level: Sustained Memory: Decreased short-term memory Following Commands: Follows one step commands inconsistently;Follows one step commands with increased time Safety/Judgement: Decreased awareness of safety;Decreased awareness of deficits Awareness: Intellectual Problem Solving: Slow processing;Decreased initiation;Requires verbal cues;Requires tactile cues General Comments: Pt resting propped up on L UE, repositioning self to gain this position often throughout session. Pt lethargic needing cues intermittently to wake up. Follows multi-modal simple cues with delayed initiation. Pt uses head shakes/nods to communicate.      General Comments General comments (skin integrity, edema, etc.): SBP 90-100 throughout session, increasing with elevation of HOB    Exercises     Assessment/Plan    PT Assessment Patient needs continued PT services  PT Problem List Decreased strength;Decreased range of motion;Decreased activity tolerance;Decreased balance;Decreased mobility;Decreased coordination;Decreased knowledge of use of DME;Decreased cognition;Decreased safety awareness       PT Treatment Interventions DME instruction;Gait training;Functional mobility training;Therapeutic activities;Therapeutic exercise;Balance training;Neuromuscular re-education;Cognitive remediation;Patient/family education;Wheelchair mobility training    PT  Goals (Current goals can be found in the Care Plan section)  Acute Rehab PT Goals Patient Stated Goal: did not state; family wishes for pt to improve PT Goal Formulation: With patient/family Time For Goal Achievement: 09/30/20 Potential to Achieve Goals: Good    Frequency Min 3X/week   Barriers to discharge        Co-evaluation PT/OT/SLP Co-Evaluation/Treatment: Yes Reason for Co-Treatment: For patient/therapist safety;Necessary to address cognition/behavior during functional activity;To address functional/ADL transfers PT goals addressed during session: Mobility/safety with mobility         AM-PAC PT "6 Clicks" Mobility  Outcome Measure Help needed turning from your back to your side while in a flat bed without using bedrails?: A Lot Help needed moving from lying on your back to sitting on the side of a flat bed without using bedrails?: A Lot Help needed moving to and from a bed to a chair (including a wheelchair)?: Total Help needed standing up from a chair using your arms (e.g., wheelchair or bedside chair)?: Total Help needed to walk in hospital room?: Total Help needed climbing 3-5 steps with a railing? : Total 6 Click Score: 8    End of Session Equipment Utilized During Treatment: Oxygen Activity Tolerance: Patient limited by lethargy Patient left: in bed;with call bell/phone within reach;with bed alarm set;with family/visitor present   PT Visit Diagnosis: Muscle weakness (generalized) (  M62.81);History of falling (Z91.81);Difficulty in walking, not elsewhere classified (R26.2);Other symptoms and signs involving the nervous system (R29.898);Apraxia (R48.2)    Time: 1062-6948 PT Time Calculation (min) (ACUTE ONLY): 21 min   Charges:   PT Evaluation $PT Eval Moderate Complexity: 1 Mod          Raymond Gurney, PT, DPT Acute Rehabilitation Services  Pager: [phone] Office: [phone]   [name] 09/16/2020, 4:34 PM

## 2020-09-16 NOTE — Progress Notes (Signed)
PT Cancellation Note  Patient Details Name: Andre Adams MRN: [id number] DOB: 1933-07-31   Cancelled Treatment:    Reason Eval/Treat Not Completed: Active bedrest order. Coordinated with RN to check if bedrest orders can be discharged this date. Will plan to follow-up as time permits and as appropriate.   Raymond Gurney, PT, DPT Acute Rehabilitation Services  Pager: [phone] Office: [phone]    [name] 09/16/2020, 8:30 AM

## 2020-09-16 NOTE — Care Management Important Message (Signed)
Important Message  Patient Details  Name: Andre Adams MRN: [id number] Date of Birth: [birth year]   Medicare Important Message Given:  Yes     Dorena Bodo 09/16/2020, 2:34 PM

## 2020-09-16 NOTE — Progress Notes (Signed)
NAME:  Andre Adams, MRN:  [id number], DOB:  07-28-1933, LOS: 5 ADMISSION DATE:  09/11/2020, CONSULTATION DATE:  7/23 REFERRING MD:  Selina Cooley MD, CHIEF COMPLAINT:   fall and left sided weakness   Brief HPI:  Goes by Andre Adams"  33 yoM with prior hx of HTN, HL, Afib off AC due to GIB HX, CKD3, frequent falls presented 7/23 form [facility] nursing home with left sided weakness with flaccid L dide arm/face/leg and was nonverbal.  LKW 1445. NIHSS score 23. TPA was not give due to fall with head trauma. CTA head showed R MCA stroke secondary to a R M2 occlusion. He was taken to [facility] with Dr. Corliss Skains.  Returned to ICU intubated, PCCM following.   Pertinent  Medical History  HTN, HL, Afib off AC due to GIB HX, CKD3, frequent falls  Significant Hospital Events:  7/23 Admitted with acute R M2 occlusion s/p NIR 7/24 loaded with keppra for R sided twitching/ EEG, MAE to noxious stimuli, followed LE command for RN; off cleviprex gtt early am; weaned all day PSV 5/5, mental status barrier to extuation 7/28  Interim History / Subjective:  No acute events overnight  Able to follow commands  Secretions seem improved   Objective   Blood pressure (!) 134/104, pulse 74, temperature 98.5 F (36.9 C), temperature source Axillary, resp. rate 17, height 5\' 7"  (1.702 m), weight 68 kg, SpO2 91 %.    Vent Mode: PRVC FiO2 (%):  [30 %] 30 % Set Rate:  [16 bmp] 16 bmp Vt Set:  [520 mL] 520 mL PEEP:  [5 cmH20] 5 cmH20 Pressure Support:  [5 cmH20-10 cmH20] 5 cmH20 Plateau Pressure:  [12 cmH20-16 cmH20] 12 cmH20   Intake/Output Summary (Last 24 hours) at 09/16/2020 0914 Last data filed at 09/16/2020 0800 Gross per 24 hour  Intake 1151 ml  Output 1450 ml  Net -299 ml    Filed Weights   09/14/20 0239 09/15/20 0407 09/16/20 0500  Weight: 69.7 kg 68.1 kg 68 kg   Examination: General: Acute on chronically ill appearing elderly male lying in bed on mechanical ventilation, in NAD HEENT: ETT, MM  pink/moist, PERRL,  Neuro: Alert and able to follow simple commands CV: s1s2 regular rate and rhythm, no murmur, rubs, or gallops,  PULM:  Clear to ascultation bilaterally, no increased work of breathing, no added breath sounds  GI: soft, bowel sounds active in all 4 quadrants, non-tender, non-distended, tolerating TF Extremities: warm/dry, no edema  Skin: no rashes or lesions  Resolved Hospital Problem list   Concern for seizure: EEG no seizure; keppra stopped per neuro  Assessment & Plan:  R MCA stroke secondary to a R M2 occlusion S/P thrombectomy -Likely related to Afib off AC given prior GIB 10/2019 P: Management per neurology  Maintain neuro protective measures; goal for eurothermia, euglycemia, eunatermia, normoxia, and PCO2 goal of 35-40 Nutrition and bowel regiment  Seizure precautions  Aspirations precautions Continue ASA, plan to start DOAC 7-10 days post stroke  Continue statin  PT/OT  SBP goal < 160  Acute respiratory insufficiency related to above  P: Continue ventilator support with lung protective strategies  Continues to tolerated SBT  Can consider trial of extubation soon but he re,mains high risk for reintubation  Wean PEEP and FiO2 for sats greater than 90%. Head of bed elevated 30 degrees. Plateau pressures less than 30 cm H20.  Follow intermittent chest x-ray and ABG.   Ensure adequate pulmonary hygiene  VAP bundle in place  PAD protocol  DM2 P: Continue SSI CBG checks q4 Cortrak in place   HX Atrial fibrillation off AC due to GIB hx -Afib on EKG P: Continue Bet blocker  Continuous telemetry  ASA and AC plan as above   CKD3 on AKI -Creat 2.50 on admission. Unclear baseline. Improved with fluids P: Follow renal function  Monitor urine output Trend Bmet Avoid nephrotoxins Ensure adequate renal perfusion   Best Practice:   Diet/type: NPO; TF  DVT prophylaxis: SCD; lovenox (monitor renal function closely)  GI prophylaxis: PPI Lines:  N/A Foley:  N/A; monitor for urinary retention  Code Status:  full code Last date of multidisciplinary goals of care discussion: Ongoing GOC. Last formal meeting 7/28 with decision made to pursue aggressive measures    Critical care time:   Performed by: Zai Chmiel D. Harris  Total critical care time: 37 minutes  Critical care time was exclusive of separately billable procedures and treating other patients.  Critical care was necessary to treat or prevent imminent or life-threatening deterioration.  Critical care was time spent personally by me on the following activities: development of treatment plan with patient and/or surrogate as well as nursing, discussions with consultants, evaluation of patient's response to treatment, examination of patient, obtaining history from patient or surrogate, ordering and performing treatments and interventions, ordering and review of laboratory studies, ordering and review of radiographic studies, pulse oximetry and re-evaluation of patient's condition.  Dianah Pruett D. Tiburcio Pea, NP-C [facility] Personal contact information can be found on Amion  09/16/2020, 9:20 AM

## 2020-09-16 NOTE — Care Management Important Message (Signed)
Important Message  Patient Details  Name: Andre Adams MRN: [id number] Date of Birth: 08/04/1933   Medicare Important Message Given:  Yes  CORRECTION MO IM GIVEN TO THE PATIANT PLEASE DISREGARD PREVIOUS NOTES    Dorena Bodo 09/16/2020, 2:36 PM

## 2020-09-16 NOTE — Progress Notes (Signed)
Inpatient Diabetes Program Recommendations  AACE/ADA: New Consensus Statement on Inpatient Glycemic Control (2015)  Target Ranges:  Prepandial:   less than 140 mg/dL      Peak postprandial:   less than 180 mg/dL (1-2 hours)      Critically ill patients:  140 - 180 mg/dL   Lab Results  Component Value Date   GLUCAP 225 (H) 09/16/2020   HGBA1C 7.8 (H) 09/12/2020    Review of Glycemic Control  Diabetes history: DM 2 OutpatieResults for LAWRANCE, WIEDEMANN (MRN [id number]) as of 09/16/2020 10:27  Ref. Range 09/15/2020 08:30 09/15/2020 12:06 09/15/2020 15:42 09/15/2020 19:50 09/15/2020 23:50 09/16/2020 03:54 09/16/2020 08:03  Glucose-Capillary Latest Ref Range: 70 - 99 mg/dL 77 76 73 631 (H) 497 (H) 254 (H) 225 (H)  nt Diabetes medications: Lantus 6 units bid, Novolog 0-14 units bid Current orders for Inpatient glycemic control:  Novolog 0-15 units Q4 hours  A1c 7.8% on 7/24 Jevity 60 ml/hour  Inpatient Diabetes Program Recommendations:    If in plan of care consider:  - Novolog 2 units Q4 hours Tube Feed Coverage   Thanks, Christena Deem RN, MSN, BC-ADM Inpatient Diabetes Coordinator Team Pager [phone] (8a-5p)

## 2020-09-16 NOTE — Care Management Important Message (Signed)
Important Message  Patient Details  Name: Andre Adams MRN: [id number] Date of Birth: 04-04-1933   Medicare Important Message Given:  Yes     Dorena Bodo 09/16/2020, 2:36 PM

## 2020-09-16 NOTE — Progress Notes (Signed)
STROKE TEAM PROGRESS NOTE   INTERVAL HISTORY Wife and daughter are at bedside.  Patient still intubated, but eyes open, more awake alert than yesterday, following simple commands on the hands and feet but seems to have right hand weakness. Plan to extubate today per CCM.    OBJECTIVE Vitals:   09/16/20 0755 09/16/20 0800 09/16/20 0900 09/16/20 1000  BP:  (!) 134/104 109/85 117/77  Pulse: 88 74 79 82  Resp: Temp:  98.5 F (36.9 C)    TempSrc:  Axillary    SpO2: 100% 91% 100% 100%  Weight:      Height:        CBC:  Recent Labs  Lab 09/15/20 0248 09/16/20 0750  WBC 10.4 10.1  HGB 12.3* 12.5*  HCT 38.2* 38.9*  MCV 101.1* 101.3*  PLT 120* 154    Basic Metabolic Panel:  Recent Labs  Lab 09/14/20 0605 09/15/20 0248 09/16/20 0750  NA 141 141 141  K 4.0 4.1 4.3  CL 110 111 111  CO2 22 21* 24  GLUCOSE 112* 105* 270*  BUN 40* 42* 46*  CREATININE 2.17* 2.07* 2.07*  CALCIUM 8.3* 8.4* 8.5*  MG 1.8  --  2.2  PHOS 3.4 3.0 3.3    Lipid Panel:     Component Value Date/Time   CHOL 147 09/12/2020 0417   TRIG 94 09/16/2020 0750   HDL 27 (L) 09/12/2020 0417   CHOLHDL 5.4 09/12/2020 0417   VLDL 20 09/12/2020 0417   LDLCALC 100 (H) 09/12/2020 0417   HgbA1c:  Lab Results  Component Value Date   HGBA1C 7.8 (H) 09/12/2020   Urine Drug Screen: No results found for: LABOPIA, COCAINSCRNUR, LABBENZ, AMPHETMU, THCU, LABBARB  Alcohol Level     Component Value Date/Time   ETH <10 09/11/2020 1845    IMAGING  DG Abd Portable 1V  Result Date: 09/15/2020 CLINICAL DATA:  Feeding tube placement EXAM: PORTABLE ABDOMEN - 1 VIEW COMPARISON:  None. FINDINGS: Feeding tube tip in the mid stomach. Nonobstructive bowel gas pattern. IMPRESSION: Feeding tube tip in the mid stomach. Electronically Signed   By: Charlett Nose M.D.   On: 09/15/2020 15:51     DG Wrist Complete Left  Result Date: 09/11/2020 CLINICAL DATA:  fall EXAM: LEFT HAND - COMPLETE 3+ VIEW; LEFT WRIST -  COMPLETE 3+ VIEW COMPARISON:  December 15, 2019 common March 13, 2020 FINDINGS: Revisualization of the sequela of an impacted fracture of the distal LEFT radius and ulna. There is mature osseous bridging with scattered residual areas of lucency from prior fracture sites. Fracture fragments are in unchanged alignment. No definitive acute fracture. Vascular calcifications. Osteopenia. Degenerative changes throughout the DIPs and PIPs. IMPRESSION: Revisualization of sequela of prior impacted fracture of the distal radius and ulna. Evaluation for superimposed acute fracture is limited due to osteopenia and underlying chronic osseous remodeling. No definitive superimposed acute fracture is noted. If persistent clinical concern for scaphoid fracture, recommend immobilization and follow-up radiographs in 2 weeks versus MRI. Electronically Signed   By: Meda Klinefelter MD   On: 09/11/2020 19:17   CT HEAD WO CONTRAST  Result Date: 09/12/2020 CLINICAL DATA:  Status post thrombectomy EXAM: CT HEAD WITHOUT CONTRAST TECHNIQUE: Contiguous axial images were obtained from the base of the skull through the vertex without intravenous contrast. COMPARISON:  None. FINDINGS: Brain: There is hyperdensity over the right MCA territory, likely contrast staining. There is generalized atrophy without lobar predilection. There is periventricular hypoattenuation compatible with  chronic microvascular disease. Vascular: No abnormal hyperdensity of the major intracranial arteries or dural venous sinuses. No intracranial atherosclerosis. Skull: The visualized skull base, calvarium and extracranial soft tissues are normal. Sinuses/Orbits: No fluid levels or advanced mucosal thickening of the visualized paranasal sinuses. No mastoid or middle ear effusion. The orbits are normal. IMPRESSION: Hyperdensity over the right MCA territory, likely contrast staining. Electronically Signed   By: Deatra RobinsonKevin  Herman M.D.   On: 09/12/2020 02:55   CT Cervical  Spine Wo Contrast  Result Date: 09/11/2020 CLINICAL DATA:  Facial trauma. EXAM: CT CERVICAL SPINE WITHOUT CONTRAST TECHNIQUE: Multidetector CT imaging of the cervical spine was performed without intravenous contrast. Multiplanar CT image reconstructions were also generated. COMPARISON:  None. FINDINGS: Alignment: No evidence of acute traumatic subluxation. Skull base and vertebrae: No acute cervical spine fracture or suspicious osseous lesion. Mild superior endplate compression fracture at T3, likely chronic. Soft tissues and spinal canal: No prevertebral fluid or swelling. No visible canal hematoma. Disc levels: Moderate to severe disc space narrowing from C4-5 to C6-7. Severe bilateral facet arthrosis at C3-4 with small subchondral cysts or erosions and joint widening on the right. Right facet ankylosis at C4-5. No evidence of high-grade spinal stenosis or high-grade neural foraminal stenosis. Upper chest: No apical lung consolidation or mass. Other: Subcentimeter thyroid nodules for which no imaging follow-up is recommended. Carotid atherosclerosis. IMPRESSION: 1. No acute cervical spine fracture. 2. Advanced cervical disc and facet degeneration. Electronically Signed   By: Sebastian AcheAllen  Grady M.D.   On: 09/11/2020 19:01   DG Pelvis Portable  Result Date: 09/11/2020 CLINICAL DATA:  trauma EXAM: PORTABLE PELVIS 1-2 VIEWS COMPARISON:  December 15, 2019 FINDINGS: Osteopenia. Excreted contrast limits evaluation of the sacrum. No acute displaced fracture seen on single view. No pelvic diastasis. Vascular calcifications. Degenerative changes of the lumbar spine. IMPRESSION: No acute fracture on single view. If persistent concern for nondisplaced hip or pelvic fracture, recommend dedicated pelvic MRI. Electronically Signed   By: Meda KlinefelterStephanie  Peacock MD   On: 09/11/2020 19:13   Portable Chest x-ray  Result Date: 09/11/2020 CLINICAL DATA:  87 year old male status post intubation. EXAM: PORTABLE CHEST 1 VIEW COMPARISON:   Earlier radiograph dated 09/11/2020. FINDINGS: Interval placement of an endotracheal tube with tip approximately 6 cm above the carina. Enteric tube with side-port in the distal esophagus and tip close to the GE junction. Recommend further advancing of the enteric tube by at least additional 10 cm. Diffuse interstitial coarsening and left lung base atelectasis similar to prior radiograph. Stable cardiomediastinal silhouette. Atherosclerotic calcification of the aorta. No acute osseous pathology. IMPRESSION: 1. Endotracheal tube above the carina. 2. Enteric tube with side-port in the distal esophagus and tip close to the GE junction. Recommend further advancing of the enteric tube by at least 10 cm. Electronically Signed   By: Elgie CollardArash  Radparvar M.D.   On: 09/11/2020 23:08   DG Chest Portable 1 View  Result Date: 09/11/2020 CLINICAL DATA:  trauma EXAM: PORTABLE CHEST 1 VIEW COMPARISON:  October 23, 2019, October 03, 2019 FINDINGS: Evaluation is limited secondary to patient rotation. The cardiomediastinal silhouette is grossly unchanged in contour. No pleural effusion. No pneumothorax. Diffuse interstitial prominence. Visualized abdomen is unremarkable. Compression fracture deformity of the T11, unchanged in comparison to prior. Compression fracture of T9 appears similar comparison to prior. IMPRESSION: Diffuse interstitial prominence likely reflecting underlying pulmonary edema. Differential considerations include atypical infection. Electronically Signed   By: Meda KlinefelterStephanie  Peacock MD   On: 09/11/2020 19:12   DG  Hand Complete Left  Result Date: 09/11/2020 CLINICAL DATA:  fall EXAM: LEFT HAND - COMPLETE 3+ VIEW; LEFT WRIST - COMPLETE 3+ VIEW COMPARISON:  December 15, 2019 common March 13, 2020 FINDINGS: Revisualization of the sequela of an impacted fracture of the distal LEFT radius and ulna. There is mature osseous bridging with scattered residual areas of lucency from prior fracture sites. Fracture fragments  are in unchanged alignment. No definitive acute fracture. Vascular calcifications. Osteopenia. Degenerative changes throughout the DIPs and PIPs. IMPRESSION: Revisualization of sequela of prior impacted fracture of the distal radius and ulna. Evaluation for superimposed acute fracture is limited due to osteopenia and underlying chronic osseous remodeling. No definitive superimposed acute fracture is noted. If persistent clinical concern for scaphoid fracture, recommend immobilization and follow-up radiographs in 2 weeks versus MRI. Electronically Signed   By: Meda Klinefelter MD   On: 09/11/2020 19:17   CT HEAD CODE STROKE WO CONTRAST  Result Date: 09/11/2020 CLINICAL DATA:  Code stroke. Neuro deficit, acute, stroke suspected. EXAM: CT HEAD WITHOUT CONTRAST TECHNIQUE: Contiguous axial images were obtained from the base of the skull through the vertex without intravenous contrast. COMPARISON:  09/27/2019 FINDINGS: Brain: There is no evidence of an acute infarct, intracranial hemorrhage, mass, midline shift, or extra-axial fluid collection. There is mild-to-moderate cerebral atrophy. Patchy hypodensities in the cerebral white matter bilaterally are similar to the prior motion degraded CT and are nonspecific but compatible with moderate chronic small vessel ischemic disease. A chronic infarct is again noted in the left basal ganglia. There is also a small chronic right cerebellar infarct. Vascular: Calcified atherosclerosis at the skull base. No hyperdense vessel. Skull: No fracture or suspicious osseous lesion. Sinuses/Orbits: Paranasal sinuses and mastoid air cells are clear. Bilateral cataract extraction. Other: None. ASPECTS [facility] Stroke Program Early CT Score) - Ganglionic level infarction (caudate, lentiform nuclei, internal capsule, insula, M1-M3 cortex): 7 - Supraganglionic infarction (M4-M6 cortex): 3 Total score (0-10 with 10 being normal): 10 IMPRESSION: 1. No evidence of acute intracranial  abnormality. 2. ASPECTS is 10. 3. Moderate chronic small vessel ischemic disease. These results were communicated to Dr. Selina Cooley at 6:21 pm on 09/11/2020 by text page via the [facility] messaging system. Electronically Signed   By: Sebastian Ache M.D.   On: 09/11/2020 18:23   CT ANGIO HEAD NECK W WO CM W PERF (CODE STROKE)  Result Date: 09/11/2020 CLINICAL DATA:  Neuro deficit, acute, stroke suspected. EXAM: CT ANGIOGRAPHY HEAD AND NECK CT PERFUSION BRAIN TECHNIQUE: Multidetector CT imaging of the head and neck was performed using the standard protocol during bolus administration of intravenous contrast. Multiplanar CT image reconstructions and MIPs were obtained to evaluate the vascular anatomy. Carotid stenosis measurements (when applicable) are obtained utilizing NASCET criteria, using the distal internal carotid diameter as the denominator. Multiphase CT imaging of the brain was performed following IV bolus contrast injection. Subsequent parametric perfusion maps were calculated using RAPID software. CONTRAST:  40mL OMNIPAQUE IOHEXOL 350 MG/ML SOLN COMPARISON:  None. FINDINGS: CTA NECK FINDINGS Aortic arch: Normal variant aortic arch branching pattern with common origin of the brachiocephalic and left common carotid arteries. Mild atherosclerotic plaque without arch vessel origin stenosis. Right carotid system: Patent with predominantly calcified plaque at the carotid bifurcation. No evidence of a significant stenosis or dissection. Left carotid system: Patent with mixed calcified and soft plaque at the carotid bifurcation. No evidence of a significant stenosis or dissection. Vertebral arteries: Patent with the left being mildly dominant. Scattered atherosclerosis bilaterally resulting in severe proximal  V1 and mild V3 stenoses on the left. Skeleton: See separate cervical spine CT report. Other neck: Subcentimeter thyroid nodules for which no imaging follow-up is recommended. Upper chest: Mild motion artifact. No  apical lung consolidation or mass. Review of the MIP images confirms the above findings CTA HEAD FINDINGS Anterior circulation: The internal carotid arteries are patent from skull base to carotid termini with atherosclerotic plaque resulting in moderate bilateral paraclinoid stenoses. The right M1 segment is patent, however there is a proximal M2 occlusion without significant distal reconstitution. The left MCA is patent with a mild M1 stenosis noted. The ACAs are patent with mild-to-moderate stenosis of the left A1 segment as well as moderate irregular narrowing of both A2 segments. No aneurysm is identified. Posterior circulation: The intracranial vertebral arteries are patent to the basilar with atherosclerosis resulting in irregularity bilaterally and a moderate stenosis on the right. The basilar artery is patent with a severe stenosis in its midportion. The right PCA is patent with a severe stenosis near the P1-P2 junction. There are severe stenoses of both PCAs near the P1-P2 junctions with poor flow distally, particularly on the left. No aneurysm is identified. Venous sinuses: As permitted by contrast timing, patent. Anatomic variants: None. Review of the MIP images confirms the above findings CT Brain Perfusion Findings: ASPECTS: 10 CBF (<30%) Volume: 40 mL Perfusion (Tmax>6.0s) volume: 184 mL Mismatch Volume: 144 mL Infarction Location: Right frontal and temporal lobes primarily at the level of the operculum (MCA territory) IMPRESSION: 1. Proximal right M2 occlusion. 2. CTP demonstrates a right MCA core infarct with large penumbra as detailed above. 3. Advanced intracranial atherosclerosis including moderate bilateral ICA stenoses, a severe basilar artery stenosis, and severe bilateral proximal PCA stenoses. 4. Cervical carotid atherosclerosis without significant stenosis. 5. Severe proximal left vertebral artery stenosis. Emergent findings were communicated to Dr. Selina Cooley at 6:35 pm on 09/11/2020 by text page  via the [facility] messaging system. Electronically Signed   By: Sebastian Ache M.D.   On: 09/11/2020 18:51   CT Maxillofacial Wo Contrast  Result Date: 09/11/2020 CLINICAL DATA:  Facial trauma. EXAM: CT MAXILLOFACIAL WITHOUT CONTRAST TECHNIQUE: Multidetector CT imaging of the maxillofacial structures was performed. Multiplanar CT image reconstructions were also generated. COMPARISON:  Head CT 09/27/2019 FINDINGS: Osseous: No acute fracture, mandibular dislocation, or destructive osseous process. Orbits: Bilateral cataract extraction and proptosis. No orbital hematoma or mass. Sinuses: Paranasal sinuses and mastoid air cells are clear. Mild rightward nasal septal deviation. Soft tissues: Punctate left parotid calcification.  Atherosclerosis. Limited intracranial: More fully evaluated on separate head CT. IMPRESSION: No acute maxillofacial fracture. Electronically Signed   By: Sebastian Ache M.D.   On: 09/11/2020 18:55    ECG - atrial fibrillation - ventricular response 94 BPM (See cardiology reading for complete details)  PHYSICAL EXAM  Temp:  [97.7 F (36.5 C)-98.8 F (37.1 C)] 98.5 F (36.9 C) (07/28 0800) Pulse Rate:  [67-99] 82 (07/28 1000) Resp:  [14-22] 20 (07/28 1000) BP: (97-135)/(66-119) 117/77 (07/28 1000) SpO2:  [91 %-100 %] 100 % (07/28 1000) FiO2 (%):  [30 %] 30 % (07/28 0800) Weight:  [68 kg] 68 kg (07/28 0500)  General - Well nourished, well developed, intubated off sedation.  Ophthalmologic - fundi not visualized due to noncooperation.  Cardiovascular - irregularly irregular heart rate and rhythm.  Neuro - intubated off sedation, eyes open spontaneously, able to follow simple commands on the hands and feet. Eyes in mid position, chronic left eye propotosis, not blinking to visual threat,  but able to track bilaterally, PERRL. Corneal present, gag and cough present. Breathing over the vent.  Facial symmetry not able to test due to ET tube.  Tongue protrusion not cooperative. BUEs and  BLEs spontaneous movement, BUE against gravity at least 3+/5, however, left hand movement deficit. BLEs able to move spontaneously 3-/5, not against gravity. No babinski. Sensation, coordination and gait not tested.   ASSESSMENT/PLAN Mr. Andre Adams is a 87 y.o. male with history of DM2, CKD stage 3a, HTN, HL, hx a fib off AC 2/2 hx GIB, mild cognitive impairment and frequent falls who presented after a fall, non verbal and L sided weakness at ALF. He did not receive IV t-PA due to head trauma with fall. The pt was taken to IR for intervention for thrombectomy Rt M2 lesion.  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 CT Head - No evidence of acute intracranial abnormality.  CTA H&N - Proximal right M2 occlusion. Advanced intracranial atherosclerosis including moderate bilateral ICA stenoses, a severe basilar artery stenosis, and severe bilateral proximal PCA stenoses.  CTP 40/144 cc, positive for penumbra MRI brain - right MCA large infarct with petechial hemorrhage MRA head - patent right M2 2D Echo EF 65-70% LDL - 100 HgbA1c 7.8 VTE prophylaxis -Lovenox No antithrombotics prior to admission, now on aspirin 325. Ongoing aggressive stroke risk factor management Therapy recommendations:  pending Disposition:  Pending  Chronic Afib Was on Coumadin in the past Coumadin discontinued in 10/2019 due to GI bleeding Follow-up with cardiology, wife and pt declined watchman device  Now on ASA Will consider AC with eliquis in 7 to 10 days post stroke (see below)  Hx of GIB Per discharge in 10/2019 "Of note, patient had recent GI bleed last month with no source of bleeding found on colonoscopy although did have a polypack to me for multiple submucosal polyps. Was plan for further polyp removal outpatient but has not done so yet. Patient had episode of large-volume hematochezia on 10/26/2019. GI was  consulted and originally reccommended observation. Serial CBCs were obtained with downtrending Hgbs, GI performed colonoscopy on 10/29/19 which revealed large fecal impaction with underlying deep ulcers in the rectum (nonbleeding) consistent with stercoral ulcers. This was thought to be the cause of the bleeding. Large borders solid stool was removed in combination with digital disimpaction, irrigation, and snare and rat-tooth forceps. Patient received Fleet enema x1 and increase MiraLAX daily 34 mg with continuing daily fiber supplementation. Important to maintain a daily aggressive bowel regimen ondischarge. Do not feel above GI findings are contraindicated for [facility], especially now pt had stroke due to not on [facility] Will consider eliquis in 7-10 days post stroke Continue ASA for now  Respiratory failure Intubated for procedure, still intubated post procedure due to mental status and not able to protect airway CCM on board Off sedation, on weaning now Plan to extubate today   ? Seizure-like activity Post procedure twitching of right arm and foot noted NOT fit to pt right sided infarct Pt loaded with Keppra x 1, on keppra 500mg  bid -> d/c due to drowsy sleepy LTM EEG no seizure -> d/c  Hypertension Stable at the low end Off IVF On metoprolol and doxazosin Long-term BP goal normotensive  Hyperlipidemia Home Lipid lowering medication: unknown LDL 100, goal < 70 Current lipid lowering medication: lipitor 40 Continue statin at discharge  Uncontrolled Diabetes Home diabetic meds: unknown Uncontrolled  On lantus and novolog 2U q4h SSI  CBG monitoring HgbA1c - 7.8, goal < 7.0 Diabetes coordinator on board PCP close follow up   Other Stroke Risk Factors Advanced age  Other Active Problems Code status - Full code CKD - stage 3b - Cre - 2.5->2.27->2.17->2.07->2.07 Chronic fractures related to fall - hip, pelvic and LEFT radius and ulna fxs  Mild leukocytosis - WBCs - 11.4  ->10.1->10.4->10.1 (afebrile) Frequent falls (2 last year, 1 this year)   Hospital day # 5  This patient is critically ill due to large right MCA infarct, afib, seizure like activity, respiratory failure and at significant risk of neurological worsening, death form recurrent stroke, status epilepticus, encephalopathy. This patient's care requires constant monitoring of vital signs, hemodynamics, respiratory and cardiac monitoring, review of multiple databases, neurological assessment, discussion with family, other specialists and medical decision making of high complexity. I spent 35 minutes of neurocritical care time in the care of this patient. I had long discussion with wife and daughter at bedside, updated pt current condition, treatment plan and potential prognosis, and answered all the questions. They expressed understanding and appreciation. I also discussed with CCM Dr. Gaynell Face.   Marvel Plan, MD PhD Stroke Neurology 09/16/2020 11:18 AM   To contact Stroke Continuity provider, please refer to [email or web address]. After hours, contact General Neurology

## 2020-09-16 NOTE — Progress Notes (Signed)
Patient only voided 300 ml since 2000. Intermittently restless throughout night. When asked if needed to urinate he nodded yes. Bladder scan showed so I/O cath performed with amber clear urine drained.Will continue to monitor.

## 2020-09-17 DIAGNOSIS — I63511 Cerebral infarction due to unspecified occlusion or stenosis of right middle cerebral artery: Secondary | ICD-10-CM | POA: Diagnosis not present

## 2020-09-17 DIAGNOSIS — N179 Acute kidney failure, unspecified: Secondary | ICD-10-CM | POA: Diagnosis not present

## 2020-09-17 DIAGNOSIS — I482 Chronic atrial fibrillation, unspecified: Secondary | ICD-10-CM | POA: Diagnosis not present

## 2020-09-17 DIAGNOSIS — R9389 Abnormal findings on diagnostic imaging of other specified body structures: Secondary | ICD-10-CM

## 2020-09-17 DIAGNOSIS — J9601 Acute respiratory failure with hypoxia: Secondary | ICD-10-CM | POA: Diagnosis not present

## 2020-09-17 DIAGNOSIS — Z7189 Other specified counseling: Secondary | ICD-10-CM

## 2020-09-17 LAB — GLUCOSE, CAPILLARY
Glucose-Capillary: 241 mg/dL — ABNORMAL HIGH (ref 70–99)
Glucose-Capillary: 165 mg/dL — ABNORMAL HIGH (ref 70–99)
Glucose-Capillary: 127 mg/dL — ABNORMAL HIGH (ref 70–99)
Glucose-Capillary: 136 mg/dL — ABNORMAL HIGH (ref 70–99)
Glucose-Capillary: 178 mg/dL — ABNORMAL HIGH (ref 70–99)
Glucose-Capillary: 165 mg/dL — ABNORMAL HIGH (ref 70–99)

## 2020-09-17 LAB — RENAL FUNCTION PANEL
Albumin: 2.4 g/dL — ABNORMAL LOW (ref 3.5–5.0)
Anion gap: 10 (ref 5–15)
BUN: 51 mg/dL — ABNORMAL HIGH (ref 8–23)
CO2: 22 mmol/L (ref 22–32)
Calcium: 8.5 mg/dL — ABNORMAL LOW (ref 8.9–10.3)
Chloride: 108 mmol/L (ref 98–111)
Creatinine, Ser: 2.1 mg/dL — ABNORMAL HIGH (ref 0.61–1.24)
GFR, Estimated: 30 mL/min — ABNORMAL LOW (ref >60)
Glucose, Bld: 223 mg/dL — ABNORMAL HIGH (ref 70–99)
Phosphorus: 3 mg/dL (ref 2.5–4.6)
Potassium: 4 mmol/L (ref 3.5–5.1)
Sodium: 140 mmol/L (ref 135–145)

## 2020-09-17 LAB — CBC
HCT: 37.8 % — ABNORMAL LOW (ref 39.0–52.0)
Hemoglobin: 12.3 g/dL — ABNORMAL LOW (ref 13.0–17.0)
MCH: 32.6 pg (ref 26.0–34.0)
MCHC: 32.5 g/dL (ref 30.0–36.0)
MCV: 100.3 fL — ABNORMAL HIGH (ref 80.0–100.0)
Platelets: 161 10*3/uL (ref 150–400)
RBC: 3.77 MIL/uL — ABNORMAL LOW (ref 4.22–5.81)
RDW: 12.9 % (ref 11.5–15.5)
WBC: 10.9 10*3/uL — ABNORMAL HIGH (ref 4.0–10.5)
nRBC: 0 % (ref 0.0–0.2)

## 2020-09-17 MED ORDER — GLYCOPYRROLATE 0.2 MG/ML IJ SOLN
0.1000 mg | INTRAMUSCULAR | Status: DC | PRN
  Administered 2020-09-17: 0.1 mg via INTRAVENOUS
  Filled 2020-09-17: qty 1

## 2020-09-17 MED ORDER — INSULIN ASPART 100 UNIT/ML IJ SOLN
5.0000 [IU] | INTRAMUSCULAR | Status: DC
  Administered 2020-09-17 – 2020-09-21 (×23): 5 [IU] via SUBCUTANEOUS

## 2020-09-17 MED ORDER — JEVITY 1.2 CAL PO LIQD
1000.0000 mL | ORAL | Status: DC
  Administered 2020-09-17 – 2020-09-22 (×6): 1000 mL
  Filled 2020-09-17 (×9): qty 1000

## 2020-09-17 MED ORDER — CHLORHEXIDINE GLUCONATE 0.12 % MT SOLN
15.0000 mL | Freq: Two times a day (BID) | OROMUCOSAL | Status: DC
  Administered 2020-09-18 – 2020-09-23 (×11): 15 mL via OROMUCOSAL
  Filled 2020-09-17 (×9): qty 15

## 2020-09-17 MED ORDER — PROSOURCE TF PO LIQD
45.0000 mL | Freq: Three times a day (TID) | ORAL | Status: DC
  Administered 2020-09-17 – 2020-09-23 (×18): 45 mL
  Filled 2020-09-17 (×17): qty 45

## 2020-09-17 MED ORDER — ORAL CARE MOUTH RINSE
15.0000 mL | Freq: Two times a day (BID) | OROMUCOSAL | Status: DC
  Administered 2020-09-17 – 2020-09-23 (×12): 15 mL via OROMUCOSAL

## 2020-09-17 NOTE — Progress Notes (Signed)
STROKE TEAM PROGRESS NOTE   INTERVAL HISTORY Wife and granddaughter are at bedside.  Patient still intubated, lethargic today, not as responsive as yesterday but still able to follow simple commands on the right hand and b/ feet, slower than yesterday. Still pending extubation. Family will like to discuss with CCM again    OBJECTIVE Vitals:   09/17/20 1000 09/17/20 1100 09/17/20 1200 09/17/20 1240  BP: 110/67 (!) 91/57 94/71 94/71   Pulse: 80 78 76 85  Resp: 15 18 15 14   Temp:   98.8 F (37.1 C)   TempSrc:   Axillary   SpO2: 97% 98% 97% 100%  Weight:      Height:        CBC:  Recent Labs  Lab 09/16/20 0750 09/17/20 0200  WBC 10.1 10.9*  HGB 12.5* 12.3*  HCT 38.9* 37.8*  MCV 101.3* 100.3*  PLT 154 161    Basic Metabolic Panel:  Recent Labs  Lab 09/14/20 0605 09/15/20 0248 09/16/20 0750 09/17/20 0200  NA 141   < > 141 140  K 4.0   < > 4.3 4.0  CL 110   < > 111 108  CO2 22   < > 24 22  GLUCOSE 112*   < > 270* 223*  BUN 40*   < > 46* 51*  CREATININE 2.17*   < > 2.07* 2.10*  CALCIUM 8.3*   < > 8.5* 8.5*  MG 1.8  --  2.2  --   PHOS 3.4   < > 3.3 3.0   < > = values in this interval not displayed.    Lipid Panel:     Component Value Date/Time   CHOL 147 09/12/2020 0417   TRIG 94 09/16/2020 0750   HDL 27 (L) 09/12/2020 0417   CHOLHDL 5.4 09/12/2020 0417   VLDL 20 09/12/2020 0417   LDLCALC 100 (H) 09/12/2020 0417   HgbA1c:  Lab Results  Component Value Date   HGBA1C 7.8 (H) 09/12/2020   Urine Drug Screen: No results found for: LABOPIA, COCAINSCRNUR, LABBENZ, AMPHETMU, THCU, LABBARB  Alcohol Level     Component Value Date/Time   ETH <10 09/11/2020 1845    IMAGING  No results found.   DG Wrist Complete Left  Result Date: 09/11/2020 CLINICAL DATA:  fall EXAM: LEFT HAND - COMPLETE 3+ VIEW; LEFT WRIST - COMPLETE 3+ VIEW COMPARISON:  December 15, 2019 common March 13, 2020 FINDINGS: Revisualization of the sequela of an impacted fracture of the distal  LEFT radius and ulna. There is mature osseous bridging with scattered residual areas of lucency from prior fracture sites. Fracture fragments are in unchanged alignment. No definitive acute fracture. Vascular calcifications. Osteopenia. Degenerative changes throughout the DIPs and PIPs. IMPRESSION: Revisualization of sequela of prior impacted fracture of the distal radius and ulna. Evaluation for superimposed acute fracture is limited due to osteopenia and underlying chronic osseous remodeling. No definitive superimposed acute fracture is noted. If persistent clinical concern for scaphoid fracture, recommend immobilization and follow-up radiographs in 2 weeks versus MRI. Electronically Signed   By: December 17, 2019 MD   On: 09/11/2020 19:17   CT HEAD WO CONTRAST  Result Date: 09/12/2020 CLINICAL DATA:  Status post thrombectomy EXAM: CT HEAD WITHOUT CONTRAST TECHNIQUE: Contiguous axial images were obtained from the base of the skull through the vertex without intravenous contrast. COMPARISON:  None. FINDINGS: Brain: There is hyperdensity over the right MCA territory, likely contrast staining. There is generalized atrophy without lobar predilection. There is periventricular hypoattenuation  compatible with chronic microvascular disease. Vascular: No abnormal hyperdensity of the major intracranial arteries or dural venous sinuses. No intracranial atherosclerosis. Skull: The visualized skull base, calvarium and extracranial soft tissues are normal. Sinuses/Orbits: No fluid levels or advanced mucosal thickening of the visualized paranasal sinuses. No mastoid or middle ear effusion. The orbits are normal. IMPRESSION: Hyperdensity over the right MCA territory, likely contrast staining. Electronically Signed   By: Deatra RobinsonKevin  Herman M.D.   On: 09/12/2020 02:55   CT Cervical Spine Wo Contrast  Result Date: 09/11/2020 CLINICAL DATA:  Facial trauma. EXAM: CT CERVICAL SPINE WITHOUT CONTRAST TECHNIQUE: Multidetector CT  imaging of the cervical spine was performed without intravenous contrast. Multiplanar CT image reconstructions were also generated. COMPARISON:  None. FINDINGS: Alignment: No evidence of acute traumatic subluxation. Skull base and vertebrae: No acute cervical spine fracture or suspicious osseous lesion. Mild superior endplate compression fracture at T3, likely chronic. Soft tissues and spinal canal: No prevertebral fluid or swelling. No visible canal hematoma. Disc levels: Moderate to severe disc space narrowing from C4-5 to C6-7. Severe bilateral facet arthrosis at C3-4 with small subchondral cysts or erosions and joint widening on the right. Right facet ankylosis at C4-5. No evidence of high-grade spinal stenosis or high-grade neural foraminal stenosis. Upper chest: No apical lung consolidation or mass. Other: Subcentimeter thyroid nodules for which no imaging follow-up is recommended. Carotid atherosclerosis. IMPRESSION: 1. No acute cervical spine fracture. 2. Advanced cervical disc and facet degeneration. Electronically Signed   By: Sebastian AcheAllen  Grady M.D.   On: 09/11/2020 19:01   DG Pelvis Portable  Result Date: 09/11/2020 CLINICAL DATA:  trauma EXAM: PORTABLE PELVIS 1-2 VIEWS COMPARISON:  December 15, 2019 FINDINGS: Osteopenia. Excreted contrast limits evaluation of the sacrum. No acute displaced fracture seen on single view. No pelvic diastasis. Vascular calcifications. Degenerative changes of the lumbar spine. IMPRESSION: No acute fracture on single view. If persistent concern for nondisplaced hip or pelvic fracture, recommend dedicated pelvic MRI. Electronically Signed   By: Meda KlinefelterStephanie  Peacock MD   On: 09/11/2020 19:13   Portable Chest x-ray  Result Date: 09/11/2020 CLINICAL DATA:  87 year old male status post intubation. EXAM: PORTABLE CHEST 1 VIEW COMPARISON:  Earlier radiograph dated 09/11/2020. FINDINGS: Interval placement of an endotracheal tube with tip approximately 6 cm above the carina. Enteric  tube with side-port in the distal esophagus and tip close to the GE junction. Recommend further advancing of the enteric tube by at least additional 10 cm. Diffuse interstitial coarsening and left lung base atelectasis similar to prior radiograph. Stable cardiomediastinal silhouette. Atherosclerotic calcification of the aorta. No acute osseous pathology. IMPRESSION: 1. Endotracheal tube above the carina. 2. Enteric tube with side-port in the distal esophagus and tip close to the GE junction. Recommend further advancing of the enteric tube by at least 10 cm. Electronically Signed   By: Elgie CollardArash  Radparvar M.D.   On: 09/11/2020 23:08   DG Chest Portable 1 View  Result Date: 09/11/2020 CLINICAL DATA:  trauma EXAM: PORTABLE CHEST 1 VIEW COMPARISON:  October 23, 2019, October 03, 2019 FINDINGS: Evaluation is limited secondary to patient rotation. The cardiomediastinal silhouette is grossly unchanged in contour. No pleural effusion. No pneumothorax. Diffuse interstitial prominence. Visualized abdomen is unremarkable. Compression fracture deformity of the T11, unchanged in comparison to prior. Compression fracture of T9 appears similar comparison to prior. IMPRESSION: Diffuse interstitial prominence likely reflecting underlying pulmonary edema. Differential considerations include atypical infection. Electronically Signed   By: Meda KlinefelterStephanie  Peacock MD   On: 09/11/2020 19:12  DG Hand Complete Left  Result Date: 09/11/2020 CLINICAL DATA:  fall EXAM: LEFT HAND - COMPLETE 3+ VIEW; LEFT WRIST - COMPLETE 3+ VIEW COMPARISON:  December 15, 2019 common March 13, 2020 FINDINGS: Revisualization of the sequela of an impacted fracture of the distal LEFT radius and ulna. There is mature osseous bridging with scattered residual areas of lucency from prior fracture sites. Fracture fragments are in unchanged alignment. No definitive acute fracture. Vascular calcifications. Osteopenia. Degenerative changes throughout the DIPs and PIPs.  IMPRESSION: Revisualization of sequela of prior impacted fracture of the distal radius and ulna. Evaluation for superimposed acute fracture is limited due to osteopenia and underlying chronic osseous remodeling. No definitive superimposed acute fracture is noted. If persistent clinical concern for scaphoid fracture, recommend immobilization and follow-up radiographs in 2 weeks versus MRI. Electronically Signed   By: Meda Klinefelter MD   On: 09/11/2020 19:17   CT HEAD CODE STROKE WO CONTRAST  Result Date: 09/11/2020 CLINICAL DATA:  Code stroke. Neuro deficit, acute, stroke suspected. EXAM: CT HEAD WITHOUT CONTRAST TECHNIQUE: Contiguous axial images were obtained from the base of the skull through the vertex without intravenous contrast. COMPARISON:  09/27/2019 FINDINGS: Brain: There is no evidence of an acute infarct, intracranial hemorrhage, mass, midline shift, or extra-axial fluid collection. There is mild-to-moderate cerebral atrophy. Patchy hypodensities in the cerebral white matter bilaterally are similar to the prior motion degraded CT and are nonspecific but compatible with moderate chronic small vessel ischemic disease. A chronic infarct is again noted in the left basal ganglia. There is also a small chronic right cerebellar infarct. Vascular: Calcified atherosclerosis at the skull base. No hyperdense vessel. Skull: No fracture or suspicious osseous lesion. Sinuses/Orbits: Paranasal sinuses and mastoid air cells are clear. Bilateral cataract extraction. Other: None. ASPECTS [facility] Stroke Program Early CT Score) - Ganglionic level infarction (caudate, lentiform nuclei, internal capsule, insula, M1-M3 cortex): 7 - Supraganglionic infarction (M4-M6 cortex): 3 Total score (0-10 with 10 being normal): 10 IMPRESSION: 1. No evidence of acute intracranial abnormality. 2. ASPECTS is 10. 3. Moderate chronic small vessel ischemic disease. These results were communicated to Dr. Selina Cooley at 6:21 pm on 09/11/2020 by  text page via the [facility] messaging system. Electronically Signed   By: Sebastian Ache M.D.   On: 09/11/2020 18:23   CT ANGIO HEAD NECK W WO CM W PERF (CODE STROKE)  Result Date: 09/11/2020 CLINICAL DATA:  Neuro deficit, acute, stroke suspected. EXAM: CT ANGIOGRAPHY HEAD AND NECK CT PERFUSION BRAIN TECHNIQUE: Multidetector CT imaging of the head and neck was performed using the standard protocol during bolus administration of intravenous contrast. Multiplanar CT image reconstructions and MIPs were obtained to evaluate the vascular anatomy. Carotid stenosis measurements (when applicable) are obtained utilizing NASCET criteria, using the distal internal carotid diameter as the denominator. Multiphase CT imaging of the brain was performed following IV bolus contrast injection. Subsequent parametric perfusion maps were calculated using RAPID software. CONTRAST:  52mL OMNIPAQUE IOHEXOL 350 MG/ML SOLN COMPARISON:  None. FINDINGS: CTA NECK FINDINGS Aortic arch: Normal variant aortic arch branching pattern with common origin of the brachiocephalic and left common carotid arteries. Mild atherosclerotic plaque without arch vessel origin stenosis. Right carotid system: Patent with predominantly calcified plaque at the carotid bifurcation. No evidence of a significant stenosis or dissection. Left carotid system: Patent with mixed calcified and soft plaque at the carotid bifurcation. No evidence of a significant stenosis or dissection. Vertebral arteries: Patent with the left being mildly dominant. Scattered atherosclerosis bilaterally resulting in severe  proximal V1 and mild V3 stenoses on the left. Skeleton: See separate cervical spine CT report. Other neck: Subcentimeter thyroid nodules for which no imaging follow-up is recommended. Upper chest: Mild motion artifact. No apical lung consolidation or mass. Review of the MIP images confirms the above findings CTA HEAD FINDINGS Anterior circulation: The internal carotid arteries  are patent from skull base to carotid termini with atherosclerotic plaque resulting in moderate bilateral paraclinoid stenoses. The right M1 segment is patent, however there is a proximal M2 occlusion without significant distal reconstitution. The left MCA is patent with a mild M1 stenosis noted. The ACAs are patent with mild-to-moderate stenosis of the left A1 segment as well as moderate irregular narrowing of both A2 segments. No aneurysm is identified. Posterior circulation: The intracranial vertebral arteries are patent to the basilar with atherosclerosis resulting in irregularity bilaterally and a moderate stenosis on the right. The basilar artery is patent with a severe stenosis in its midportion. The right PCA is patent with a severe stenosis near the P1-P2 junction. There are severe stenoses of both PCAs near the P1-P2 junctions with poor flow distally, particularly on the left. No aneurysm is identified. Venous sinuses: As permitted by contrast timing, patent. Anatomic variants: None. Review of the MIP images confirms the above findings CT Brain Perfusion Findings: ASPECTS: 10 CBF (<30%) Volume: 40 mL Perfusion (Tmax>6.0s) volume: 184 mL Mismatch Volume: 144 mL Infarction Location: Right frontal and temporal lobes primarily at the level of the operculum (MCA territory) IMPRESSION: 1. Proximal right M2 occlusion. 2. CTP demonstrates a right MCA core infarct with large penumbra as detailed above. 3. Advanced intracranial atherosclerosis including moderate bilateral ICA stenoses, a severe basilar artery stenosis, and severe bilateral proximal PCA stenoses. 4. Cervical carotid atherosclerosis without significant stenosis. 5. Severe proximal left vertebral artery stenosis. Emergent findings were communicated to Dr. Selina Cooley at 6:35 pm on 09/11/2020 by text page via the [facility] messaging system. Electronically Signed   By: Sebastian Ache M.D.   On: 09/11/2020 18:51   CT Maxillofacial Wo Contrast  Result Date:  09/11/2020 CLINICAL DATA:  Facial trauma. EXAM: CT MAXILLOFACIAL WITHOUT CONTRAST TECHNIQUE: Multidetector CT imaging of the maxillofacial structures was performed. Multiplanar CT image reconstructions were also generated. COMPARISON:  Head CT 09/27/2019 FINDINGS: Osseous: No acute fracture, mandibular dislocation, or destructive osseous process. Orbits: Bilateral cataract extraction and proptosis. No orbital hematoma or mass. Sinuses: Paranasal sinuses and mastoid air cells are clear. Mild rightward nasal septal deviation. Soft tissues: Punctate left parotid calcification.  Atherosclerosis. Limited intracranial: More fully evaluated on separate head CT. IMPRESSION: No acute maxillofacial fracture. Electronically Signed   By: Sebastian Ache M.D.   On: 09/11/2020 18:55    ECG - atrial fibrillation - ventricular response 94 BPM (See cardiology reading for complete details)  PHYSICAL EXAM  Temp:  [98.8 F (37.1 C)-99.3 F (37.4 C)] 98.8 F (37.1 C) (07/29 1200) Pulse Rate:  [74-97] 85 (07/29 1240) Resp:  [13-22] 14 (07/29 1240) BP: (87-125)/(55-93) 94/71 (07/29 1240) SpO2:  [94 %-100 %] 100 % (07/29 1240) FiO2 (%):  [30 %] 30 % (07/29 0744) Weight:  [66.6 kg] 66.6 kg (07/29 0415)  General - Well nourished, well developed, intubated off sedation.  Ophthalmologic - fundi not visualized due to noncooperation.  Cardiovascular - irregularly irregular heart rate and rhythm.  Neuro - intubated off sedation, eyes closed barely open on voice, able to follow simple commands on the right hand and feet. Eyes in mid position, chronic left eye propotosis,  not blinking to visual threat, but able to track bilaterally, PERRL. Corneal present, gag and cough present. Breathing over the vent.  Facial symmetry not able to test due to ET tube.  Tongue protrusion not cooperative. BUEs and BLEs spontaneous movement, BUE against gravity 3/5, however, left hand movement deficit. BLEs able to move spontaneously 3-/5, not  against gravity. No babinski. Sensation, coordination and gait not tested.   ASSESSMENT/PLAN Andre Adams is a 87 y.o. male with history of DM2, CKD stage 3a, HTN, HL, hx a fib off AC 2/2 hx GIB, mild cognitive impairment and frequent falls who presented after a fall, non verbal and L sided weakness at ALF. He did not receive IV t-PA due to head trauma with fall. The pt was taken to IR for intervention for thrombectomy Rt M2 lesion.  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 CT Head - No evidence of acute intracranial abnormality.  CTA H&N - Proximal right M2 occlusion. Advanced intracranial atherosclerosis including moderate bilateral ICA stenoses, a severe basilar artery stenosis, and severe bilateral proximal PCA stenoses.  CTP 40/144 cc, positive for penumbra MRI brain - right MCA large infarct with petechial hemorrhage MRA head - patent right M2 2D Echo EF 65-70% LDL - 100 HgbA1c 7.8 VTE prophylaxis -Lovenox No antithrombotics prior to admission, now on aspirin 325. Will consider eliquis early next week (10 days post stroke) Ongoing aggressive stroke risk factor management Therapy recommendations:  SNF Disposition:  Pending  Chronic Afib Was on Coumadin in the past Coumadin discontinued in 10/2019 due to GI bleeding Follow-up with cardiology, wife and pt declined watchman device  Now on ASA Will consider AC with eliquis in 7 to 10 days post stroke (early next week)  Hx of GIB Per discharge in 10/2019 "Of note, patient had recent GI bleed last month with no source of bleeding found on colonoscopy although did have a polypack to me for multiple submucosal polyps. Was plan for further polyp removal outpatient but has not done so yet. Patient had episode of large-volume hematochezia on 10/26/2019. GI was consulted and originally reccommended observation. Serial CBCs were obtained with  downtrending Hgbs, GI performed colonoscopy on 10/29/19 which revealed large fecal impaction with underlying deep ulcers in the rectum (nonbleeding) consistent with stercoral ulcers. This was thought to be the cause of the bleeding. Large borders solid stool was removed in combination with digital disimpaction, irrigation, and snare and rat-tooth forceps. Patient received Fleet enema x1 and increase MiraLAX daily 34 mg with continuing daily fiber supplementation. Important to maintain a daily aggressive bowel regimen ondischarge. Do not feel above GI findings are contraindicated for [facility], especially now pt had stroke due to not on [facility] Will consider eliquis in 7-10 days post stroke (early next week) Continue ASA for now  Respiratory failure Intubated for procedure, still intubated post procedure due to mental status and not able to protect airway CCM on board Off sedation, on weaning now Plan to extubate today   ? Seizure-like activity Post procedure twitching of right arm and foot noted NOT fit to pt right sided infarct Pt loaded with Keppra x 1, on keppra  bid -> d/c due to drowsy sleepy LTM EEG no seizure -> d/c  Hypertension Stable at the low end Off IVF On metoprolol and doxazosin Long-term BP goal normotensive  Hyperlipidemia Home Lipid lowering medication: unknown LDL 100, goal < 70 Current lipid  lowering medication: lipitor 40 Continue statin at discharge  Uncontrolled Diabetes Home diabetic meds: unknown Uncontrolled  Hyperglycemia  On lantus and novolog 2U q4h -> 5U q4h SSI  CBG monitoring HgbA1c - 7.8, goal < 7.0 Diabetes coordinator on board PCP close follow up   Other Stroke Risk Factors Advanced age  Other Active Problems Code status - Full code CKD - stage 3b - Cre - 2.5->2.27->2.17->2.07->2.07->2.10 Chronic fractures related to fall - hip, pelvic and LEFT radius and ulna fxs  Mild leukocytosis - WBCs - 11.4 ->10.1->10.4->10.1->10.9 (afebrile) Frequent  falls (2 last year, 1 this year)   Hospital day # 6   This patient is critically ill due to right MCA infarct, afib, seizure like activity, respiratory failure and at significant risk of neurological worsening, death form recurrent stroke, heart failure, status epilepticus. This patient's care requires constant monitoring of vital signs, hemodynamics, respiratory and cardiac monitoring, review of multiple databases, neurological assessment, discussion with family, other specialists and medical decision making of high complexity. I spent 35 minutes of neurocritical care time in the care of this patient. I discussed with wife and granddaughter at the bedside.   Marvel Plan, MD PhD Stroke Neurology 09/17/2020 1:36 PM   To contact Stroke Continuity provider, please refer to [email or web address]. After hours, contact General Neurology

## 2020-09-17 NOTE — Progress Notes (Signed)
Occupational Therapy Evaluation (late entry)  Pt presents to OT with the below listed diagnosis and the below listed deficits.  Pt lethargic, but did arouse with cues.  He follows one step motor commands, but more of a delay noted with commands on the Lt.  He is currently dependent for ADLs.  EOB/OOB activity deferred due to SBP in the mid 90s in supine.   PTA, pt resided in an ALF. He was mod I with ambulation using RW, and required assist with most ADLs but he was able to self feed and toilet mod I.  Anticipate he will require SNF level rehab. Will follow acutely    09/16/20 2000  OT Visit Information  Last OT Received On 09/16/20  Assistance Needed +2  PT/OT/SLP Co-Evaluation/Treatment Yes  Reason for Co-Treatment Necessary to address cognition/behavior during functional activity;For patient/therapist safety;Complexity of the patient's impairments (multi-system involvement)  OT goals addressed during session Strengthening/ROM;ADL's and self-care  History of Present Illness Pt is an 87 y.o. male who presented from an ALF on 09/11/20 s/p fall with L-sided weakness. NIHSS = 20. No tPA administered 2/2 fall with head trauma. 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 PMH: DM2, CKD stage 3a, HTN, HL, hx a fib off AC 2/2 hx GIB  Precautions  Precautions Fall  Precaution Comments ETT; bil mittens; monitor vitals  Home Living  Family/patient expects to be discharged to: Assisted living  Home Equipment Walker - 2 wheels;Wheelchair - manual  Prior Function  Level of Independence Needs assistance  Gait / Transfers Assistance Needed Pt was able to ambulate in his room with mod I using the RW and was ambulating in hallways with supervision.  ADL's / Homemaking Assistance Needed Pt needed assistance for most ADLs. He could assist with dressing  He was able to ambulate to bathroom and perform toileting and was able to self feed  Comments Family  reports most recent fall prior to this one was last year.  Communication  Communication Other (comment) (intubated)  Pain Assessment  Pain Assessment Faces  Faces Pain Scale 4  Pain Location grimacing with ETT  Pain Descriptors / Indicators Grimacing;Restless  Cognition  Arousal/Alertness Lethargic;Awake/alert  Behavior During Therapy Restless;Flat affect  Overall Cognitive Status Impaired/Different from baseline  Area of Impairment Attention;Memory;Following commands;Safety/judgement;Problem solving;Rancho level  Current Attention Level Sustained  Following Commands Follows one step commands inconsistently;Follows one step commands with increased time  Safety/Judgement Decreased awareness of safety  Problem Solving Slow processing;Decreased initiation;Requires verbal cues;Requires tactile cues  General Comments Pt lethargic, but awakens with cues.  He followed one step simple commands with increased time - more delayed with command following on the Lt.  Upper Extremity Assessment  Upper Extremity Assessment Generalized weakness;RUE deficits/detail;LUE deficits/detail  RUE Deficits / Details pt moves bil. UEs spontaneously, but unable to accurately assess coordination and strength  LUE Deficits / Details Pt moves Lt UE spontaneously, but movement is delayed when compared to the Rt.   AAROM appears WFL.  Unable to accurately assess strength and coordination  Lower Extremity Assessment  Lower Extremity Assessment Defer to PT evaluation  Cervical / Trunk Assessment  Cervical / Trunk Assessment Kyphotic (with L neck lateral flexion/rotation in resting)  ADL  Overall ADL's  Needs assistance/impaired  General ADL Comments Pt is dependent with ADLs at this time.  Attempted simple grooming, but pt fell asleep and was unable to engage  Vision- Assessment  Additional Comments pt unable to  participate in formal assessment.  He does track and will look for objects and peopel on both the Lt and  the Rt  Bed Mobility  Overal bed mobility Needs Assistance  Bed Mobility Rolling  Rolling Mod assist  General bed mobility comments Deferred EOB mobility due to low BP, thus monitored vitals with changes in bed position with BP increasing as HOB increased. Pt automatically attempts to reposition self to prop his head on his L UE, wife reports he does this at baseline.  General Comments  General comments (skin integrity, edema, etc.) SBP 90s supine.  Increased to 104 with pt positioned in partial chari position  OT - End of Session  Equipment Utilized During Treatment Oxygen (vent)  Activity Tolerance Patient limited by lethargy  Patient left in bed;with call bell/phone within reach;with restraints reapplied;with family/visitor present;with bed alarm set  Nurse Communication Mobility status  OT Assessment  OT Recommendation/Assessment Patient needs continued OT Services  OT Visit Diagnosis Cognitive communication deficit (R41.841);Muscle weakness (generalized) (M62.81)  Symptoms and signs involving cognitive functions Cerebral infarction  OT Problem List Decreased strength;Decreased activity tolerance;Impaired balance (sitting and/or standing);Impaired vision/perception;Decreased coordination;Decreased cognition;Decreased safety awareness;Decreased knowledge of use of DME or AE;Decreased knowledge of precautions;Cardiopulmonary status limiting activity;Impaired UE functional use  OT Plan  OT Frequency (ACUTE ONLY) Min 2X/week  OT Treatment/Interventions (ACUTE ONLY) Self-care/ADL training;DME and/or AE instruction;Therapeutic activities;Visual/perceptual remediation/compensation;Cognitive remediation/compensation;Patient/family education;Balance training;Neuromuscular education  AM-PAC OT "6 Clicks" Daily Activity Outcome Measure (Version 2)  Help from another person eating meals? 1  Help from another person taking care of personal grooming? 1  Help from another person toileting, which  includes using toliet, bedpan, or urinal? 1  Help from another person bathing (including washing, rinsing, drying)? 1  Help from another person to put on and taking off regular upper body clothing? 1  Help from another person to put on and taking off regular lower body clothing? 1  6 Click Score 6  Progressive Mobility  What is the highest level of mobility based on the progressive mobility assessment? Level 1 (Bedfast) - Unable to balance while sitting on edge of bed  Mobility Sit up in bed/chair position for meals  OT Recommendation  Follow Up Recommendations SNF  Individuals Consulted  Consulted and Agree with Results and Recommendations Patient;Family member/caregiver  Family Member Consulted wife  Acute Rehab OT Goals  Patient Stated Goal did not state; family wishes for pt to improve  OT Goal Formulation With family  Time For Goal Achievement 10/01/20  Potential to Achieve Goals Good  OT Time Calculation  OT Start Time (ACUTE ONLY) 1553  OT Stop Time (ACUTE ONLY) 1614  OT Time Calculation (min) 21 min  OT General Charges  $OT Visit 1 Visit  OT Evaluation  $OT Eval High Complexity 1 High  Written Expression  Dominant Hand Right  Eber Jones., OTR/L Acute Rehabilitation Services Pager [phone] Office [phone]

## 2020-09-17 NOTE — Progress Notes (Signed)
Physical Therapy Treatment Patient Details Name: STEVIN BIELINSKI MRN: [id number] DOB: 1933-07-16 Today's Date: 09/17/2020    History of Present Illness Pt is an 87 y.o. male who presented from an ALF on 09/11/20 s/p fall with L-sided weakness. NIHSS = 20. No tPA administered 2/2 fall with head trauma. 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 Extubated 7/29. PMH: DM2, CKD stage 3a, HTN, HL, hx a fib off AC 2/2 hx GIB    PT Comments    Pt now s/p extubation. Pt following less commands this date and is impulsive and restless. Pt apparently anxious with standing. He displays generalized lower extremity weakness and coordination deficits as he needing bil knee block and maxA, +2 for safety, with sit to stand and stand pivot transfers this date. Will continue to follow acutely. Current recommendations remain appropriate.   Follow Up Recommendations  SNF     Equipment Recommendations  Other (comment) (defer to post acute venue)    Recommendations for Other Services       Precautions / Restrictions Precautions Precautions: Fall Precaution Comments: monitor vitals Restrictions Weight Bearing Restrictions: No    Mobility  Bed Mobility Overal bed mobility: Needs Assistance Bed Mobility: Supine to Sit;Sit to Supine     Supine to sit: Max assist;+2 for physical assistance;HOB elevated Sit to supine: Max assist;+2 for physical assistance;HOB elevated   General bed mobility comments: Pt needing repeated tactile and verbal cues to manage legs and keep legs off EOB, maxAx2 to ascend trunk. MaxAx2 to manage legs and trunk to return to supine.    Transfers Overall transfer level: Needs assistance Equipment used: 1 person hand held assist Transfers: Sit to/from [employer] Sit to Stand: Max assist;+2 safety/equipment Stand pivot transfers: Max assist;+2 safety/equipment       General transfer comment: Bil  knee block, cuing pt to hug PT anterior to him to avoid him holding onto objects preventing transfers. MaxAx2 to steady, extend knees and hips, and improve upright posture, +2 for safety. MaxA to pivot to L 1x and to R 1x commode <> bed, + 2 for safety.  Ambulation/Gait             General Gait Details: Unable this date.   Stairs             Wheelchair Mobility    Modified Rankin (Stroke Patients Only) Modified Rankin (Stroke Patients Only) Pre-Morbid Rankin Score: Moderate disability Modified Rankin: Severe disability     Balance Overall balance assessment: Needs assistance Sitting-balance support: Bilateral upper extremity supported;Feet supported Sitting balance-Leahy Scale: Poor Sitting balance - Comments: External assistance and UE support to sit statically, min guard-modA.   Standing balance support: Bilateral upper extremity supported Standing balance-Leahy Scale: Zero Standing balance comment: MaxA with bil knee block and UE support to stand.                            Cognition Arousal/Alertness: Lethargic;Awake/alert Behavior During Therapy: Restless;Flat affect;Anxious;Impulsive Overall Cognitive Status: Impaired/Different from baseline Area of Impairment: Attention;Memory;Following commands;Safety/judgement;Awareness;Problem solving;Rancho level               Rancho Levels of Cognitive Functioning Rancho Los Amigos Scales of Cognitive Functioning: Confused/inappropriate/non-agitated   Current Attention Level: Sustained Memory: Decreased short-term memory Following Commands: Follows one step commands inconsistently;Follows one step commands with increased time Safety/Judgement: Decreased awareness of safety;Decreased awareness of deficits Awareness: Intellectual  Problem Solving: Slow processing;Decreased initiation;Requires verbal cues;Requires tactile cues;Difficulty sequencing General Comments: Pt initially lethargic, needing  stimulation to awaken, but improved alertness sitting up EOB. Pt following less commands this date. Pt restless, fidgeting with objects often. Also, impulsive to try to reposition self or stand. Appeared anxious with transfers.      Exercises      General Comments General comments (skin integrity, edema, etc.): Poor waveform with SpO2 momentarily reading as low as 70s% but quickly would return to 90s% when waveform improved      Pertinent Vitals/Pain Pain Assessment: Faces Faces Pain Scale: Hurts little more Pain Location: grimacing with mobility Pain Descriptors / Indicators: Grimacing Pain Intervention(s): Limited activity within patient's tolerance;Monitored during session;Repositioned    Home Living                      Prior Function            PT Goals (current goals can now be found in the care plan section) Acute Rehab PT Goals Patient Stated Goal: to have bowel movement PT Goal Formulation: Patient unable to participate in goal setting Time For Goal Achievement: 09/30/20 Potential to Achieve Goals: Good Progress towards PT goals: Progressing toward goals    Frequency    Min 3X/week      PT Plan Current plan remains appropriate    Co-evaluation              AM-PAC PT "6 Clicks" Mobility   Outcome Measure  Help needed turning from your back to your side while in a flat bed without using bedrails?: A Lot Help needed moving from lying on your back to sitting on the side of a flat bed without using bedrails?: Total Help needed moving to and from a bed to a chair (including a wheelchair)?: Total Help needed standing up from a chair using your arms (e.g., wheelchair or bedside chair)?: Total Help needed to walk in hospital room?: Total Help needed climbing 3-5 steps with a railing? : Total 6 Click Score: 7    End of Session Equipment Utilized During Treatment: Oxygen;Gait belt Activity Tolerance: Patient limited by fatigue Patient left: in  bed;with call bell/phone within reach;with bed alarm set Nurse Communication: Mobility status;Other (comment);Need for lift equipment (sats) PT Visit Diagnosis: Muscle weakness (generalized) (M62.81);History of falling (Z91.81);Difficulty in walking, not elsewhere classified (R26.2);Other symptoms and signs involving the nervous system (R29.898);Apraxia (R48.2);Unsteadiness on feet (R26.81)     Time: 7035-0093 PT Time Calculation (min) (ACUTE ONLY): 26 min  Charges:  $Therapeutic Activity: 23-37 mins                     Raymond Gurney, PT, DPT Acute Rehabilitation Services  Pager: [phone] Office: [phone]    [name] 09/17/2020, 6:20 PM

## 2020-09-17 NOTE — Procedures (Signed)
Extubation Procedure Note  Patient Details:   Name: Andre Adams DOB: 06/11/1933 MRN: [id number]   Airway Documentation:    Vent end date: 09/17/20 Vent end time: 1240   Evaluation  O2 sats: stable throughout Complications: No apparent complications Patient did tolerate procedure well.  Yes  Pt one way extubated to 4L [place] with RN and family at bedside. Positive cuff leak noted. Pt is tolerating well. RT will continue to monitor.  Forest Becker 09/17/2020, 12:47 PM

## 2020-09-17 NOTE — Progress Notes (Signed)
SLP Cancellation Note  Patient Details Name: Andre Adams MRN: [id number] DOB: 1933-05-04   Cancelled treatment:       Reason Eval/Treat Not Completed: Patient not medically ready, remains on vent. SLP will plan to check in at the beginning of next week, but please reach out to weekend pager [phone]) if needed sooner than that.     Mahala Menghini., M.A. CCC-SLP Acute Rehabilitation Services Pager [phone] Office [phone]  09/17/2020, 7:22 AM

## 2020-09-17 NOTE — Progress Notes (Signed)
NAME:  Andre Adams, MRN:  [id number], DOB:  11/18/1933, LOS: 6 ADMISSION DATE:  09/11/2020, CONSULTATION DATE:  7/23 REFERRING MD:  Selina Cooley MD, CHIEF COMPLAINT:   fall and left sided weakness   Brief HPI:  Goes by Andre Adams"  68 yoM with prior hx of HTN, HL, Afib off AC due to GIB HX, CKD3, frequent falls presented 7/23 form [facility] nursing home with left sided weakness with flaccid L dide arm/face/leg and was nonverbal.  LKW 1445. NIHSS score 23. TPA was not give due to fall with head trauma. CTA head showed R MCA stroke secondary to a R M2 occlusion. He was taken to [facility] with Dr. Corliss Skains.  Returned to ICU intubated, PCCM following.   Pertinent  Medical History  HTN, HL, Afib off AC due to GIB HX, CKD3, frequent falls  Significant Hospital Events:  7/23 Admitted with acute R M2 occlusion s/p NIR 7/24 loaded with keppra for R sided twitching/ EEG, MAE to noxious stimuli, followed LE command for RN; off cleviprex gtt early am; weaned all day PSV 5/5, mental status barrier to extuation 7/29 Patient continues to tolerate SBT well but his mentation waxes and weans this coupled with oral secretions is concerning for ability to protect airway post extubation   Interim History / Subjective:  No acute issues overnight  Currently tolerating SBT trial   Objective   Blood pressure 108/70, pulse 76, temperature 99 F (37.2 C), resp. rate 18, height 5\' 7"  (1.702 m), weight 66.6 kg, SpO2 100 %.    Vent Mode: PRVC FiO2 (%):  [30 %] 30 % Set Rate:  [16 bmp] 16 bmp Vt Set:  [520 mL] 520 mL PEEP:  [5 cmH20] 5 cmH20 Pressure Support:  [5 cmH20] 5 cmH20 Plateau Pressure:  [13 cmH20-16 cmH20] 16 cmH20   Intake/Output Summary (Last 24 hours) at 09/17/2020 09/19/2020 Last data filed at 09/17/2020 0600 Gross per 24 hour  Intake 1920 ml  Output 400 ml  Net 1520 ml    Filed Weights   09/15/20 0407 09/16/20 0500 09/17/20 0415  Weight: 68.1 kg 68 kg 66.6 kg   Examination: General: Acute on  chronically ill appearing elderly male lying in bed on mechanical ventilation, in NAD HEENT: ETT, MM pink/moist, PERRL,  Neuro: Alert and able to follow simple commands, slightly decreased  movement seen to right foot  CV: s1s2 regular rate and rhythm, no murmur, rubs, or gallops,  PULM:  Slight rhonchi to bilateral bases, no increased work of breathing, tolerating SBT  GI: soft, bowel sounds active in all 4 quadrants, non-tender, non-distended, tolerating TF Extremities: warm/dry, no edema  Skin: no rashes or lesions  Resolved Hospital Problem list   Concern for seizure: EEG no seizure; keppra stopped per neuro  Assessment & Plan:  R MCA stroke secondary to a R M2 occlusion S/P thrombectomy -Likely related to Afib off AC given prior GIB 10/2019 P: Management per neurology  Maintain neuro protective measures; goal for eurothermia, euglycemia, eunatermia, normoxia, and PCO2 goal of 35-40 Nutrition and bowel regiment  Seizure precautions  AEDs per neurology  Aspirations precautions  PT/OT efforts  SBP goal < 160  Acute respiratory insufficiency related to above  -Patient continues to tolerate SBT but given large acute CVA his is very weak and has a very high potential for airway compromise once extubated. Would like to finalize goals of care (especially trach decision) prior to extubation  P: Continue ventilator support with lung protective strategies  Continue daily  SBT trials  Scop patch to help dry secretions  Wean PEEP and FiO2 for sats greater than 90%. Head of bed elevated 30 degrees. Plateau pressures less than 30 cm H20.  Follow intermittent chest x-ray and ABG.   Ensure adequate pulmonary hygiene  Follow cultures  VAP bundle in place  PAD protocol  DM2 P: Continue SSI CBG checks q4hrs  HX Atrial fibrillation off AC due to GIB hx -Afib on EKG P: Remains on beta blocker with rate control Continuous telemetry  ASA and AC plan as above   CKD3 on AKI -Creat 2.50  on admission. Unclear baseline. Improved with fluids P: Follow renal function  Monitor urine output Trend Bmet Avoid nephrotoxins Ensure adequate renal perfusion   Best Practice:   Diet/type: NPO; TF  DVT prophylaxis: SCD; lovenox (monitor renal function closely)  GI prophylaxis: PPI Lines: N/A Foley:  N/A; monitor for urinary retention  Code Status:  full code Last date of multidisciplinary goals of care discussion: Ongoing GOC, last discussion held with family at bedside 7/28. Initially patients daughter felt that the decision to proceed with tracheotomy tube had been decided 7/27. The care team would like to give every opportunity to allow for progression and not commit to a tracheostomy tube as patient has a document living will that indicates he would want less aggressive measures.    PMT has been consulted to assist in continued GOC discussions,  Critical care time:   Performed by: Arnie Clingenpeel D. Harris  Total critical care time: 36  minutes  Critical care time was exclusive of separately billable procedures and treating other patients.  Critical care was necessary to treat or prevent imminent or life-threatening deterioration.  Critical care was time spent personally by me on the following activities: development of treatment plan with patient and/or surrogate as well as nursing, discussions with consultants, evaluation of patient's response to treatment, examination of patient, obtaining history from patient or surrogate, ordering and performing treatments and interventions, ordering and review of laboratory studies, ordering and review of radiographic studies, pulse oximetry and re-evaluation of patient's condition.  Andre Adams D. Tiburcio Pea, NP-C [facility] Personal contact information can be found on Amion  09/17/2020, 7:09 AM

## 2020-09-18 DIAGNOSIS — G9341 Metabolic encephalopathy: Secondary | ICD-10-CM | POA: Diagnosis not present

## 2020-09-18 DIAGNOSIS — J9601 Acute respiratory failure with hypoxia: Secondary | ICD-10-CM | POA: Diagnosis not present

## 2020-09-18 DIAGNOSIS — I63511 Cerebral infarction due to unspecified occlusion or stenosis of right middle cerebral artery: Secondary | ICD-10-CM | POA: Diagnosis not present

## 2020-09-18 DIAGNOSIS — N179 Acute kidney failure, unspecified: Secondary | ICD-10-CM | POA: Diagnosis not present

## 2020-09-18 LAB — BASIC METABOLIC PANEL
Anion gap: 8 (ref 5–15)
BUN: 54 mg/dL — ABNORMAL HIGH (ref 8–23)
CO2: 23 mmol/L (ref 22–32)
Calcium: 8.7 mg/dL — ABNORMAL LOW (ref 8.9–10.3)
Chloride: 112 mmol/L — ABNORMAL HIGH (ref 98–111)
Creatinine, Ser: 2.02 mg/dL — ABNORMAL HIGH (ref 0.61–1.24)
GFR, Estimated: 32 mL/min — ABNORMAL LOW (ref >60)
Glucose, Bld: 126 mg/dL — ABNORMAL HIGH (ref 70–99)
Potassium: 4.7 mmol/L (ref 3.5–5.1)
Sodium: 143 mmol/L (ref 135–145)

## 2020-09-18 LAB — GLUCOSE, CAPILLARY
Glucose-Capillary: 127 mg/dL — ABNORMAL HIGH (ref 70–99)
Glucose-Capillary: 117 mg/dL — ABNORMAL HIGH (ref 70–99)
Glucose-Capillary: 209 mg/dL — ABNORMAL HIGH (ref 70–99)
Glucose-Capillary: 158 mg/dL — ABNORMAL HIGH (ref 70–99)
Glucose-Capillary: 179 mg/dL — ABNORMAL HIGH (ref 70–99)
Glucose-Capillary: 187 mg/dL — ABNORMAL HIGH (ref 70–99)

## 2020-09-18 NOTE — Progress Notes (Signed)
Patient ID: Andre Adams, male   DOB: [birth year], 87 y.o.   MRN: [id number] STROKE TEAM PROGRESS NOTE   INTERVAL HISTORY Wife and daughter are at bedside.  Patient extubated yesterday.doing well per nurse,also at bedside. No issues overnight.   OBJECTIVE Vitals:   09/18/20 1041 09/18/20 1100 09/18/20 1200 09/18/20 1600  BP: 133/83 112/79 110/64 92/61  Pulse: 93 (!) 107 80 85  Resp:  (!) 22 (!) 22 17  Temp:   98 F (36.7 C)   TempSrc:   Oral   SpO2:  96% 97% 98%  Weight:      Height:        CBC:  Recent Labs  Lab 09/16/20 0750 09/17/20 0200  WBC 10.1 10.9*  HGB 12.5* 12.3*  HCT 38.9* 37.8*  MCV 101.3* 100.3*  PLT 154 161    Basic Metabolic Panel:  Recent Labs  Lab 09/14/20 0605 09/15/20 0248 09/16/20 0750 09/17/20 0200 09/18/20 0351  NA 141   < > 141 140 143  K 4.0   < > 4.3 4.0 4.7  CL 110   < > 111 108 112*  CO2 22   < > 24 22 23   GLUCOSE 112*   < > 270* 223* 126*  BUN 40*   < > 46* 51* 54*  CREATININE 2.17*   < > 2.07* 2.10* 2.02*  CALCIUM 8.3*   < > 8.5* 8.5* 8.7*  MG 1.8  --  2.2  --   --   PHOS 3.4   < > 3.3 3.0  --    < > = values in this interval not displayed.    Lipid Panel:     Component Value Date/Time   CHOL 147 09/12/2020 0417   TRIG 94 09/16/2020 0750   HDL 27 (L) 09/12/2020 0417   CHOLHDL 5.4 09/12/2020 0417   VLDL 20 09/12/2020 0417   LDLCALC 100 (H) 09/12/2020 0417   HgbA1c:  Lab Results  Component Value Date   HGBA1C 7.8 (H) 09/12/2020   Urine Drug Screen: No results found for: LABOPIA, COCAINSCRNUR, LABBENZ, AMPHETMU, THCU, LABBARB  Alcohol Level     Component Value Date/Time   ETH <10 09/11/2020 1845    IMAGING  No results found.   DG Wrist Complete Left  Result Date: 09/11/2020 CLINICAL DATA:  fall EXAM: LEFT HAND - COMPLETE 3+ VIEW; LEFT WRIST - COMPLETE 3+ VIEW COMPARISON:  December 15, 2019 common March 13, 2020 FINDINGS: Revisualization of the sequela of an impacted fracture of the distal LEFT radius and  ulna. There is mature osseous bridging with scattered residual areas of lucency from prior fracture sites. Fracture fragments are in unchanged alignment. No definitive acute fracture. Vascular calcifications. Osteopenia. Degenerative changes throughout the DIPs and PIPs. IMPRESSION: Revisualization of sequela of prior impacted fracture of the distal radius and ulna. Evaluation for superimposed acute fracture is limited due to osteopenia and underlying chronic osseous remodeling. No definitive superimposed acute fracture is noted. If persistent clinical concern for scaphoid fracture, recommend immobilization and follow-up radiographs in 2 weeks versus MRI. Electronically Signed   By: Meda KlinefelterStephanie  Peacock MD   On: 09/11/2020 19:17   CT HEAD WO CONTRAST  Result Date: 09/12/2020 CLINICAL DATA:  Status post thrombectomy EXAM: CT HEAD WITHOUT CONTRAST TECHNIQUE: Contiguous axial images were obtained from the base of the skull through the vertex without intravenous contrast. COMPARISON:  None. FINDINGS: Brain: There is hyperdensity over the right MCA territory, likely contrast staining. There is generalized atrophy without  lobar predilection. There is periventricular hypoattenuation compatible with chronic microvascular disease. Vascular: No abnormal hyperdensity of the major intracranial arteries or dural venous sinuses. No intracranial atherosclerosis. Skull: The visualized skull base, calvarium and extracranial soft tissues are normal. Sinuses/Orbits: No fluid levels or advanced mucosal thickening of the visualized paranasal sinuses. No mastoid or middle ear effusion. The orbits are normal. IMPRESSION: Hyperdensity over the right MCA territory, likely contrast staining. Electronically Signed   By: Deatra Robinson M.D.   On: 09/12/2020 02:55   CT Cervical Spine Wo Contrast  Result Date: 09/11/2020 CLINICAL DATA:  Facial trauma. EXAM: CT CERVICAL SPINE WITHOUT CONTRAST TECHNIQUE: Multidetector CT imaging of the  cervical spine was performed without intravenous contrast. Multiplanar CT image reconstructions were also generated. COMPARISON:  None. FINDINGS: Alignment: No evidence of acute traumatic subluxation. Skull base and vertebrae: No acute cervical spine fracture or suspicious osseous lesion. Mild superior endplate compression fracture at T3, likely chronic. Soft tissues and spinal canal: No prevertebral fluid or swelling. No visible canal hematoma. Disc levels: Moderate to severe disc space narrowing from C4-5 to C6-7. Severe bilateral facet arthrosis at C3-4 with small subchondral cysts or erosions and joint widening on the right. Right facet ankylosis at C4-5. No evidence of high-grade spinal stenosis or high-grade neural foraminal stenosis. Upper chest: No apical lung consolidation or mass. Other: Subcentimeter thyroid nodules for which no imaging follow-up is recommended. Carotid atherosclerosis. IMPRESSION: 1. No acute cervical spine fracture. 2. Advanced cervical disc and facet degeneration. Electronically Signed   By: Sebastian Ache M.D.   On: 09/11/2020 19:01   DG Pelvis Portable  Result Date: 09/11/2020 CLINICAL DATA:  trauma EXAM: PORTABLE PELVIS 1-2 VIEWS COMPARISON:  December 15, 2019 FINDINGS: Osteopenia. Excreted contrast limits evaluation of the sacrum. No acute displaced fracture seen on single view. No pelvic diastasis. Vascular calcifications. Degenerative changes of the lumbar spine. IMPRESSION: No acute fracture on single view. If persistent concern for nondisplaced hip or pelvic fracture, recommend dedicated pelvic MRI. Electronically Signed   By: Meda Klinefelter MD   On: 09/11/2020 19:13   Portable Chest x-ray  Result Date: 09/11/2020 CLINICAL DATA:  87 year old male status post intubation. EXAM: PORTABLE CHEST 1 VIEW COMPARISON:  Earlier radiograph dated 09/11/2020. FINDINGS: Interval placement of an endotracheal tube with tip approximately 6 cm above the carina. Enteric tube with  side-port in the distal esophagus and tip close to the GE junction. Recommend further advancing of the enteric tube by at least additional 10 cm. Diffuse interstitial coarsening and left lung base atelectasis similar to prior radiograph. Stable cardiomediastinal silhouette. Atherosclerotic calcification of the aorta. No acute osseous pathology. IMPRESSION: 1. Endotracheal tube above the carina. 2. Enteric tube with side-port in the distal esophagus and tip close to the GE junction. Recommend further advancing of the enteric tube by at least 10 cm. Electronically Signed   By: Elgie Collard M.D.   On: 09/11/2020 23:08   DG Chest Portable 1 View  Result Date: 09/11/2020 CLINICAL DATA:  trauma EXAM: PORTABLE CHEST 1 VIEW COMPARISON:  October 23, 2019, October 03, 2019 FINDINGS: Evaluation is limited secondary to patient rotation. The cardiomediastinal silhouette is grossly unchanged in contour. No pleural effusion. No pneumothorax. Diffuse interstitial prominence. Visualized abdomen is unremarkable. Compression fracture deformity of the T11, unchanged in comparison to prior. Compression fracture of T9 appears similar comparison to prior. IMPRESSION: Diffuse interstitial prominence likely reflecting underlying pulmonary edema. Differential considerations include atypical infection. Electronically Signed   By: Meda Klinefelter MD  On: 09/11/2020 19:12   DG Hand Complete Left  Result Date: 09/11/2020 CLINICAL DATA:  fall EXAM: LEFT HAND - COMPLETE 3+ VIEW; LEFT WRIST - COMPLETE 3+ VIEW COMPARISON:  December 15, 2019 common March 13, 2020 FINDINGS: Revisualization of the sequela of an impacted fracture of the distal LEFT radius and ulna. There is mature osseous bridging with scattered residual areas of lucency from prior fracture sites. Fracture fragments are in unchanged alignment. No definitive acute fracture. Vascular calcifications. Osteopenia. Degenerative changes throughout the DIPs and PIPs.  IMPRESSION: Revisualization of sequela of prior impacted fracture of the distal radius and ulna. Evaluation for superimposed acute fracture is limited due to osteopenia and underlying chronic osseous remodeling. No definitive superimposed acute fracture is noted. If persistent clinical concern for scaphoid fracture, recommend immobilization and follow-up radiographs in 2 weeks versus MRI. Electronically Signed   By: Meda Klinefelter MD   On: 09/11/2020 19:17   CT HEAD CODE STROKE WO CONTRAST  Result Date: 09/11/2020 CLINICAL DATA:  Code stroke. Neuro deficit, acute, stroke suspected. EXAM: CT HEAD WITHOUT CONTRAST TECHNIQUE: Contiguous axial images were obtained from the base of the skull through the vertex without intravenous contrast. COMPARISON:  09/27/2019 FINDINGS: Brain: There is no evidence of an acute infarct, intracranial hemorrhage, mass, midline shift, or extra-axial fluid collection. There is mild-to-moderate cerebral atrophy. Patchy hypodensities in the cerebral white matter bilaterally are similar to the prior motion degraded CT and are nonspecific but compatible with moderate chronic small vessel ischemic disease. A chronic infarct is again noted in the left basal ganglia. There is also a small chronic right cerebellar infarct. Vascular: Calcified atherosclerosis at the skull base. No hyperdense vessel. Skull: No fracture or suspicious osseous lesion. Sinuses/Orbits: Paranasal sinuses and mastoid air cells are clear. Bilateral cataract extraction. Other: None. ASPECTS [facility] Stroke Program Early CT Score) - Ganglionic level infarction (caudate, lentiform nuclei, internal capsule, insula, M1-M3 cortex): 7 - Supraganglionic infarction (M4-M6 cortex): 3 Total score (0-10 with 10 being normal): 10 IMPRESSION: 1. No evidence of acute intracranial abnormality. 2. ASPECTS is 10. 3. Moderate chronic small vessel ischemic disease. These results were communicated to Dr. Selina Cooley at 6:21 pm on 09/11/2020 by  text page via the [facility] messaging system. Electronically Signed   By: Sebastian Ache M.D.   On: 09/11/2020 18:23   CT ANGIO HEAD NECK W WO CM W PERF (CODE STROKE)  Result Date: 09/11/2020 CLINICAL DATA:  Neuro deficit, acute, stroke suspected. EXAM: CT ANGIOGRAPHY HEAD AND NECK CT PERFUSION BRAIN TECHNIQUE: Multidetector CT imaging of the head and neck was performed using the standard protocol during bolus administration of intravenous contrast. Multiplanar CT image reconstructions and MIPs were obtained to evaluate the vascular anatomy. Carotid stenosis measurements (when applicable) are obtained utilizing NASCET criteria, using the distal internal carotid diameter as the denominator. Multiphase CT imaging of the brain was performed following IV bolus contrast injection. Subsequent parametric perfusion maps were calculated using RAPID software. CONTRAST:  64mL OMNIPAQUE IOHEXOL 350 MG/ML SOLN COMPARISON:  None. FINDINGS: CTA NECK FINDINGS Aortic arch: Normal variant aortic arch branching pattern with common origin of the brachiocephalic and left common carotid arteries. Mild atherosclerotic plaque without arch vessel origin stenosis. Right carotid system: Patent with predominantly calcified plaque at the carotid bifurcation. No evidence of a significant stenosis or dissection. Left carotid system: Patent with mixed calcified and soft plaque at the carotid bifurcation. No evidence of a significant stenosis or dissection. Vertebral arteries: Patent with the left being mildly dominant. Scattered  atherosclerosis bilaterally resulting in severe proximal V1 and mild V3 stenoses on the left. Skeleton: See separate cervical spine CT report. Other neck: Subcentimeter thyroid nodules for which no imaging follow-up is recommended. Upper chest: Mild motion artifact. No apical lung consolidation or mass. Review of the MIP images confirms the above findings CTA HEAD FINDINGS Anterior circulation: The internal carotid arteries  are patent from skull base to carotid termini with atherosclerotic plaque resulting in moderate bilateral paraclinoid stenoses. The right M1 segment is patent, however there is a proximal M2 occlusion without significant distal reconstitution. The left MCA is patent with a mild M1 stenosis noted. The ACAs are patent with mild-to-moderate stenosis of the left A1 segment as well as moderate irregular narrowing of both A2 segments. No aneurysm is identified. Posterior circulation: The intracranial vertebral arteries are patent to the basilar with atherosclerosis resulting in irregularity bilaterally and a moderate stenosis on the right. The basilar artery is patent with a severe stenosis in its midportion. The right PCA is patent with a severe stenosis near the P1-P2 junction. There are severe stenoses of both PCAs near the P1-P2 junctions with poor flow distally, particularly on the left. No aneurysm is identified. Venous sinuses: As permitted by contrast timing, patent. Anatomic variants: None. Review of the MIP images confirms the above findings CT Brain Perfusion Findings: ASPECTS: 10 CBF (<30%) Volume: 40 mL Perfusion (Tmax>6.0s) volume: 184 mL Mismatch Volume: 144 mL Infarction Location: Right frontal and temporal lobes primarily at the level of the operculum (MCA territory) IMPRESSION: 1. Proximal right M2 occlusion. 2. CTP demonstrates a right MCA core infarct with large penumbra as detailed above. 3. Advanced intracranial atherosclerosis including moderate bilateral ICA stenoses, a severe basilar artery stenosis, and severe bilateral proximal PCA stenoses. 4. Cervical carotid atherosclerosis without significant stenosis. 5. Severe proximal left vertebral artery stenosis. Emergent findings were communicated to Dr. Selina Cooley at 6:35 pm on 09/11/2020 by text page via the [facility] messaging system. Electronically Signed   By: Sebastian Ache M.D.   On: 09/11/2020 18:51   CT Maxillofacial Wo Contrast  Result Date:  09/11/2020 CLINICAL DATA:  Facial trauma. EXAM: CT MAXILLOFACIAL WITHOUT CONTRAST TECHNIQUE: Multidetector CT imaging of the maxillofacial structures was performed. Multiplanar CT image reconstructions were also generated. COMPARISON:  Head CT 09/27/2019 FINDINGS: Osseous: No acute fracture, mandibular dislocation, or destructive osseous process. Orbits: Bilateral cataract extraction and proptosis. No orbital hematoma or mass. Sinuses: Paranasal sinuses and mastoid air cells are clear. Mild rightward nasal septal deviation. Soft tissues: Punctate left parotid calcification.  Atherosclerosis. Limited intracranial: More fully evaluated on separate head CT. IMPRESSION: No acute maxillofacial fracture. Electronically Signed   By: Sebastian Ache M.D.   On: 09/11/2020 18:55    ECG - atrial fibrillation - ventricular response 94 BPM (See cardiology reading for complete details)  PHYSICAL EXAM  Temp:  [98 F (36.7 C)-99.3 F (37.4 C)] 98 F (36.7 C) (07/30 1200) Pulse Rate:  [75-111] 85 (07/30 1600) Resp:  [17-31] 17 (07/30 1600) BP: (81-152)/(50-110) 92/61 (07/30 1600) SpO2:  [92 %-100 %] 98 % (07/30 1600) Weight:  [70.3 kg] 70.3 kg (07/30 0343)  General - No acute distress appears chronically ill. Pulmonary: tolerating room air.   Neuro - PERRL. Left eye proptosis noted. Corneal present, gag and cough present. Speech is dysarthric and hypophonic. Attempts to answer questions but not intelligible. Not able to assess orientation though likely not oriented. Will track examiner.  Detailed motor exam difficult. Moving all ext spontaneously. In mitts.  Sensation, coordination and gait not tested due to mentation.   ASSESSMENT/PLAN Mr. HERSHEL CORKERY is a 87 y.o. male with history of DM2, CKD stage 3a, HTN, HL, hx a fib off AC 2/2 hx GIB, mild cognitive impairment and frequent falls who presented after a fall, non verbal and L sided weakness at ALF. He did not receive IV t-PA due to head trauma with fall.  The pt was taken to IR for intervention for thrombectomy Rt M2 lesion.  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 CT Head - No evidence of acute intracranial abnormality.  CTA H&N - Proximal right M2 occlusion. Advanced intracranial atherosclerosis including moderate bilateral ICA stenoses, a severe basilar artery stenosis, and severe bilateral proximal PCA stenoses.  CTP 40/144 cc, positive for penumbra MRI brain - right MCA large infarct with petechial hemorrhage MRA head - patent right M2 2D Echo EF 65-70% LDL - 100 HgbA1c 7.8 VTE prophylaxis -Lovenox No antithrombotics prior to admission, now on aspirin 325. Will consider eliquis early next week (10 days post stroke) Ongoing aggressive stroke risk factor management Therapy recommendations:  SNF Disposition:  Pending  Chronic Afib Was on Coumadin in the past Coumadin discontinued in 10/2019 due to GI bleeding Follow-up with cardiology, wife and pt declined watchman device  Now on ASA Will consider AC with eliquis in 7 to 10 days post stroke (early next week)  Hx of GIB Per discharge in 10/2019 "Of note, patient had recent GI bleed last month with no source of bleeding found on colonoscopy although did have a polypack to me for multiple submucosal polyps. Was plan for further polyp removal outpatient but has not done so yet. Patient had episode of large-volume hematochezia on 10/26/2019. GI was consulted and originally reccommended observation. Serial CBCs were obtained with downtrending Hgbs, GI performed colonoscopy on 10/29/19 which revealed large fecal impaction with underlying deep ulcers in the rectum (nonbleeding) consistent with stercoral ulcers. This was thought to be the cause of the bleeding. Large borders solid stool was removed in combination with digital disimpaction, irrigation, and snare and rat-tooth forceps. Patient received Fleet  enema x1 and increase MiraLAX daily 34 mg with continuing daily fiber supplementation. Important to maintain a daily aggressive bowel regimen ondischarge. Do not feel above GI findings are contraindicated for [facility], especially now pt had stroke due to not on [facility] Will consider eliquis in 7-10 days post stroke (early next week) Continue ASA for now  Respiratory failure Extubated yesterday. Tolerating room air. Transfer to progressive unit. Family confirmed DNR status again today.  Seizure-like activity EEG neg, keppra stopped.  Hypertension Stable at the low end Off IVF On metoprolol and doxazosin Long-term BP goal normotensive  Hyperlipidemia Home Lipid lowering medication: unknown LDL 100, goal < 70 Current lipid lowering medication: lipitor 40 Continue statin at discharge  Uncontrolled Diabetes Home diabetic meds: unknown Uncontrolled  Hyperglycemia  On lantus and novolog 2U q4h -> 5U q4h SSI  CBG monitoring HgbA1c - 7.8, goal < 7.0 Diabetes coordinator on board PCP close follow up   Other Stroke Risk Factors Advanced age  Other Active Problems Code status - Full code CKD - stage 3b - Cre - 2.5->2.27->2.17->2.07->2.07->2.10 Chronic fractures related to fall - hip, pelvic and LEFT radius and ulna fxs  Mild leukocytosis - WBCs - 11.4 ->10.1->10.4->10.1->10.9 (afebrile) Frequent falls (2 last year, 1 this year)   Hospital day # 7  This patient is critically ill due to right MCA infarct, afib, review of multiple databases, neurological assessment, discussion with family, other specialists and medical decision making of high complexity. I spent 30 minutes of neurocritical care time in the care of this patient. I discussed with wife and daughter at the bedside.  Leticia Penna, MD

## 2020-09-18 NOTE — Progress Notes (Signed)
NAME:  PREET PERRIER, MRN:  [id number], DOB:  November 07, 1933, LOS: 7 ADMISSION DATE:  09/11/2020, CONSULTATION DATE:  7/23 REFERRING MD:  Selina Cooley MD, CHIEF COMPLAINT:   fall and left sided weakness   Brief HPI:  Goes by [name]"  59 yoM with prior hx of HTN, HL, Afib off AC due to GIB HX, CKD3, frequent falls presented 7/23 form [facility] home with left sided weakness with flaccid L dide arm/face/leg and was nonverbal.  LKW 1445. NIHSS score 23. TPA was not give due to fall with head trauma. CTA head showed R MCA stroke secondary to a R M2 occlusion. He was taken to [facility] with Dr. Corliss Skains.  Returned to ICU intubated, PCCM following.   Pertinent  Medical History  HTN, HL, Afib off AC due to GIB HX, CKD3, frequent falls  Significant Hospital Events:  7/23 Admitted with acute R M2 occlusion s/p NIR 7/24 loaded with keppra for R sided twitching/ EEG, MAE to noxious stimuli, followed LE command for RN; off cleviprex gtt early am; weaned all day PSV 5/5, mental status barrier to extuation 7/29 Patient continues to tolerate SBT well but his mentation waxes and weans this coupled with oral secretions is concerning for ability to protect airway post extubation  7/29 extubated  Interim History / Subjective:  No acute issues overnight. Weaned to room air. Delirious and agitated, intermittently follows commands Needs deep suctioning Objective   Blood pressure 133/83, pulse 93, temperature 98.5 F (36.9 C), temperature source Oral, resp. rate (!) 22, height 5\' 7"  (1.702 m), weight 70.3 kg, SpO2 93 %.        Intake/Output Summary (Last 24 hours) at 09/18/2020 1055 Last data filed at 09/18/2020 0900 Gross per 24 hour  Intake 1064 ml  Output 800 ml  Net 264 ml   Filed Weights   09/16/20 0500 09/17/20 0415 09/18/20 0343  Weight: 68 kg 66.6 kg 70.3 kg   Examination: General: Acute on chronically ill appearing elderly male lying in bed, NAD HEENT: ETT, MM pink/moist, PERRL,  Neuro:  agitated and trying to get out of bed, in mitts,  CV: s1s2 regular rate and rhythm, no murmur, rubs, or gallops,  PULM:  no respiratory distress, poor cough mechanics GI: soft, bowel sounds active in all 4 quadrants, non-tender, non-distended, tolerating TF Extremities: warm/dry, no edema  Skin: no rashes or lesions  Labs reviewed Na 143, Cr 2.02, K 2.7  Resolved Hospital Problem list   Concern for seizure: EEG no seizure; keppra stopped per neuro  Assessment & Plan:  R MCA stroke secondary to a R M2 occlusion S/P thrombectomy -Likely related to Afib off AC given prior GIB 10/2019 P: Management per neurology  Maintain neuro protective measures; goal for eurothermia, euglycemia, eunatermia, normoxia, and PCO2 goal of 35-40 Nutrition and bowel regiment  Seizure precautions  AEDs per neurology  Aspirations precautions  SBP goal < 160 Mobilization for agitated delirium, working with PT/OT as tolerated, instructed wife on bedside PROM.   Acute respiratory insufficiency related to above  - Extubated 7/29, with no plans to re-intubate given respiratory decline. Patient made DNR/DNI after family discussion.  - frequent suctioning, OOB to help mobilize secretions.  - would stop anticholinergics to help mobilize secretions, otherwise they can become impacted after being dessicated.   DM2 P: Continue SSI CBG checks q4hrs  HX Atrial fibrillation off AC due to GIB hx -Afib on EKG P: Remains on beta blocker with rate control Continuous telemetry  ASA  and AC plan as above   CKD3 on AKI -Creat 2.50 on admission. Baseline around 2 P: Follow renal function  Monitor urine output Trend Bmet Avoid nephrotoxins Ensure adequate renal perfusion   Best Practice:   Diet/type: NPO; TF, needs SLP eval. DVT prophylaxis: SCD; lovenox (monitor renal function closely)  GI prophylaxis: PPI Lines: N/A Foley:  N/A; monitor for urinary retention  Code Status:  DNR Last date of  multidisciplinary goals of care discussion: 7/29 talked with wife and family. Patient would not want trach and life support or LTACH. Plan to continue all current care, but in the event of respiratory decline, transition to comfort measures.   Critical care time:   The patient is critically ill due to respiratory failure, encephalopathy.  Critical care was necessary to treat or prevent imminent or life-threatening deterioration.  Critical care was time spent personally by me on the following activities: development of treatment plan with patient and/or surrogate as well as nursing, discussions with consultants, evaluation of patient's response to treatment, examination of patient, obtaining history from patient or surrogate, ordering and performing treatments and interventions, ordering and review of laboratory studies, ordering and review of radiographic studies, pulse oximetry, re-evaluation of patient's condition and participation in multidisciplinary rounds.   Critical Care Time devoted to patient care services described in this note is 33 minutes. This time reflects time of care of this signee Charlott Holler . This critical care time does not reflect separately billable procedures or procedure time, teaching time or supervisory time of PA/NP/Med student/Med Resident etc but could involve care discussion time.       Mickel Baas Pulmonary and Critical Care Medicine 09/18/2020 11:05 AM  Pager: see AMION  If no response to pager , please call critical care on call (see AMION) until 7pm After 7:00 pm call Elink

## 2020-09-18 NOTE — Evaluation (Signed)
Clinical/Bedside Swallow Evaluation Patient Details  Name: Andre Adams MRN: [id number] Date of Birth: Mar 27, 1933  Today's Date: 09/18/2020 Time: SLP Start Time (ACUTE ONLY): 1205 SLP Stop Time (ACUTE ONLY): 1225 SLP Time Calculation (min) (ACUTE ONLY): 20 min  Past Medical History: History reviewed. No pertinent past medical history. Past Surgical History:  Past Surgical History:  Procedure Laterality Date   RADIOLOGY WITH ANESTHESIA N/A 09/11/2020   Procedure: IR WITH ANESTHESIA;  Surgeon: Julieanne Cotton, MD;  Location: [facility] OR;  Service: Radiology;  Laterality: N/A;   HPI:  Andre Adams is a 87 y.o. male with PMH significant for DM2, CKD stage 3a, HTN, HL, hx a fib off AC 2/2 hx GIB and frequent falls who presented after fall and L sided weakness at ALF. LKW 1445, was walking with his walker down the hall, and fell to the floor. Noted to have flaccid L side arm/face/leg. Nonverbal. All are new deficits.  Most recent chest xray was showing persistent chronic interstitial coarsening with interval improvement in retrocardiac left base atelectasis.  Most recetn MRI of the head was showing a moderately large acute right MCA territory infarct with petechial hemorrhage and moderate chronic small vessel ischemic disease with chronic lacunar infarcts.   Assessment / Plan / Recommendation Clinical Impression  Clinical swallowing evaluation was done using ice chips only.  The patient was seen sitting upright in his bedside chair with his wife and daughter present.  They reported no issues swallwoing prior to admission and that he was on a regular diet.  He is s/p prolonged intubation and was extubated yesterday.  He was noted to have what sounded to be upper airway secretions that he was not able to clear.  Per his daughter he was doing some better today.  She reported he verbalized two words today.  He was able to state his name and attempted to say his date of birth on request.  Cranial nerve  exam was unable to be completed due to his inability to follow commands.   When presented with ice chips no oral manipulation was observed.  Therefore, material was removed from his oral cavity.  Assessment of the pharyngeal phase was unable to be made at this time.  Suggest he continue with alternate means of nutrition/hydration/meds.  ST will continue to follow for PO readiess. SLP Visit Diagnosis: Dysphagia, unspecified (R13.10)    Aspiration Risk  Severe aspiration risk    Diet Recommendation   NPO  Medication Administration: Via alternative means    Other  Recommendations Oral Care Recommendations: Oral care QID   Follow up Recommendations Other (comment) (TBD)      Frequency and Duration min 2x/week  2 weeks       Prognosis Prognosis for Safe Diet Advancement: Fair      Swallow Study   General Date of Onset: 09/11/20 HPI: Andre Adams is a 87 y.o. male with PMH significant for DM2, CKD stage 3a, HTN, HL, hx a fib off AC 2/2 hx GIB and frequent falls who presented after fall and L sided weakness at ALF. LKW 1445, was walking with his walker down the hall, and fell to the floor. Noted to have flaccid L side arm/face/leg. Nonverbal. All are new deficits.  Most recent chest xray was showing persistent chronic interstitial coarsening with interval improvement in retrocardiac left base atelectasis.  Most recetn MRI of the head was showing a moderately large acute right MCA territory infarct with petechial hemorrhage and moderate chronic small vessel  ischemic disease with chronic lacunar infarcts. Type of Study: Bedside Swallow Evaluation Previous Swallow Assessment: None noted at [facility]. Diet Prior to this Study: NG Tube Temperature Spikes Noted: No Respiratory Status: Room air History of Recent Intubation: No Behavior/Cognition: Alert;Requires cueing;Doesn't follow directions Oral Cavity Assessment: Within Functional Limits Oral Care Completed by SLP: No Self-Feeding Abilities:  Total assist Patient Positioning: Upright in chair Baseline Vocal Quality: Wet Volitional Cough: Cognitively unable to elicit Volitional Swallow: Unable to elicit    Oral/Motor/Sensory Function Overall Oral Motor/Sensory Function:  (UNABLE TO ASSESS)   Ice Chips Ice chips: Impaired Presentation: Spoon Oral Phase Impairments: Poor awareness of bolus;Impaired mastication Oral Phase Functional Implications: Oral holding   Thin Liquid Thin Liquid: Not tested    Nectar Thick Nectar Thick Liquid: Not tested   Honey Thick Honey Thick Liquid: Not tested   Puree Puree: Not tested   Solid     Solid: Not tested     Dimas Aguas, MA, CCC-SLP Acute Rehab SLP [phone]  Fleet Contras 09/18/2020,1:02 PM

## 2020-09-19 DIAGNOSIS — N183 Chronic kidney disease, stage 3 unspecified: Secondary | ICD-10-CM | POA: Diagnosis not present

## 2020-09-19 DIAGNOSIS — I639 Cerebral infarction, unspecified: Secondary | ICD-10-CM | POA: Diagnosis not present

## 2020-09-19 DIAGNOSIS — R531 Weakness: Secondary | ICD-10-CM | POA: Diagnosis not present

## 2020-09-19 DIAGNOSIS — R5381 Other malaise: Secondary | ICD-10-CM

## 2020-09-19 DIAGNOSIS — I63511 Cerebral infarction due to unspecified occlusion or stenosis of right middle cerebral artery: Secondary | ICD-10-CM | POA: Diagnosis not present

## 2020-09-19 DIAGNOSIS — Z66 Do not resuscitate: Secondary | ICD-10-CM

## 2020-09-19 LAB — GLUCOSE, CAPILLARY
Glucose-Capillary: 175 mg/dL — ABNORMAL HIGH (ref 70–99)
Glucose-Capillary: 228 mg/dL — ABNORMAL HIGH (ref 70–99)
Glucose-Capillary: 222 mg/dL — ABNORMAL HIGH (ref 70–99)
Glucose-Capillary: 186 mg/dL — ABNORMAL HIGH (ref 70–99)

## 2020-09-19 LAB — BASIC METABOLIC PANEL
Anion gap: 10 (ref 5–15)
BUN: 58 mg/dL — ABNORMAL HIGH (ref 8–23)
CO2: 22 mmol/L (ref 22–32)
Calcium: 8.7 mg/dL — ABNORMAL LOW (ref 8.9–10.3)
Chloride: 113 mmol/L — ABNORMAL HIGH (ref 98–111)
Creatinine, Ser: 1.9 mg/dL — ABNORMAL HIGH (ref 0.61–1.24)
GFR, Estimated: 34 mL/min — ABNORMAL LOW (ref >60)
Glucose, Bld: 202 mg/dL — ABNORMAL HIGH (ref 70–99)
Potassium: 4.1 mmol/L (ref 3.5–5.1)
Sodium: 145 mmol/L (ref 135–145)

## 2020-09-19 NOTE — Plan of Care (Signed)
  Problem: Education: Goal: Knowledge of disease or condition will improve Outcome: Progressing Goal: Knowledge of secondary prevention will improve Outcome: Progressing Goal: Knowledge of patient specific risk factors addressed and post discharge goals established will improve Outcome: Progressing Goal: Individualized Educational Video(s) Outcome: Progressing   Problem: Coping: Goal: Will verbalize positive feelings about self Outcome: Progressing   

## 2020-09-19 NOTE — Progress Notes (Signed)
  Speech Language Pathology Treatment: Dysphagia  Patient Details Name: Andre Adams MRN: [id number] DOB: 15-Jan-1934 Today's Date: 09/19/2020 Time: [id number] SLP Time Calculation (min) (ACUTE ONLY): 10 min  Assessment / Plan / Recommendation Clinical Impression  Pt shows improved engagement in PO trials today, although still exhibiting signs of dysphagia. He has thick oral secretions at baseline, which were suctioned and removed well with oral care. Anterior spillage is also noted out of the L side of his mouth, both at baseline and during trials of ice/water. His voice is audibly wet and his volitional cough is weak, although with cues to cough "harder" he is able to expectorate a small amount of secretions and return his voice to a clear state. Throughout PO trials he does not automatically initiate any part of the swallowing process, but he follows specific commands to sequence through task (open your mouth, close your lips, chew, swallow, etc.). He is not ready for a PO diet, but will benefit from ongoing SLP f/u for swallowing with likely need for MBS, although would like to see if he can get him to swallow a little more automatically first.    HPI HPI: Pt is an 87 y.o. male who presented from an ALF on 09/11/20 s/p fall with L-sided weakness. NIHSS = 20. No tPA administered 2/2 fall with head trauma. 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 ETT 7/23-7/29. PMH: DM2, CKD stage 3a, HTN, HL, hx a fib off AC 2/2 hx GIB      SLP Plan  Continue with current plan of care       Recommendations  Diet recommendations: NPO Medication Administration: Via alternative means                Oral Care Recommendations: Oral care QID Follow up Recommendations: Other (comment) Plan: Continue with current plan of care       GO                Mahala Menghini., M.A. CCC-SLP Acute Rehabilitation Services Pager [phone] Office  [phone]  09/19/2020, 9:56 AM

## 2020-09-19 NOTE — Progress Notes (Addendum)
Patient ID: Andre Adams, male   DOB: 11-Nov-1933, 87 y.o.   MRN: [id number] Patient ID: Andre Adams, male   DOB: August 09, 1933, 87 y.o.   MRN: [id number] STROKE TEAM PROGRESS NOTE   INTERVAL HISTORY No family at bedside. doing well per nurse,also at bedside. No issues overnight. Did not get transfer out of ICU due to bed availability   OBJECTIVE Vitals:   09/19/20 1200 09/19/20 1300 09/19/20 1400 09/19/20 1500  BP: 115/76 (!) 90/59 96/71 117/75  Pulse: 89 90 93 91  Resp:      Temp: 99.1 F (37.3 C)     TempSrc: Oral     SpO2: 98% 99% 98% 100%  Weight:      Height:        CBC:  Recent Labs  Lab 09/16/20 0750 09/17/20 0200  WBC 10.1 10.9*  HGB 12.5* 12.3*  HCT 38.9* 37.8*  MCV 101.3* 100.3*  PLT 154 161    Basic Metabolic Panel:  Recent Labs  Lab 09/14/20 0605 09/15/20 0248 09/16/20 0750 09/17/20 0200 09/18/20 0351  NA 141   < > 141 140 143  K 4.0   < > 4.3 4.0 4.7  CL 110   < > 111 108 112*  CO2 22   < > 24 22 23   GLUCOSE 112*   < > 270* 223* 126*  BUN 40*   < > 46* 51* 54*  CREATININE 2.17*   < > 2.07* 2.10* 2.02*  CALCIUM 8.3*   < > 8.5* 8.5* 8.7*  MG 1.8  --  2.2  --   --   PHOS 3.4   < > 3.3 3.0  --    < > = values in this interval not displayed.    Lipid Panel:     Component Value Date/Time   CHOL 147 09/12/2020 0417   TRIG 94 09/16/2020 0750   HDL 27 (L) 09/12/2020 0417   CHOLHDL 5.4 09/12/2020 0417   VLDL 20 09/12/2020 0417   LDLCALC 100 (H) 09/12/2020 0417   HgbA1c:  Lab Results  Component Value Date   HGBA1C 7.8 (H) 09/12/2020   Urine Drug Screen: No results found for: LABOPIA, COCAINSCRNUR, LABBENZ, AMPHETMU, THCU, LABBARB  Alcohol Level     Component Value Date/Time   ETH <10 09/11/2020 1845    IMAGING  No results found.   DG Wrist Complete Left  Result Date: 09/11/2020 CLINICAL DATA:  fall EXAM: LEFT HAND - COMPLETE 3+ VIEW; LEFT WRIST - COMPLETE 3+ VIEW COMPARISON:  December 15, 2019 common March 13, 2020 FINDINGS:  Revisualization of the sequela of an impacted fracture of the distal LEFT radius and ulna. There is mature osseous bridging with scattered residual areas of lucency from prior fracture sites. Fracture fragments are in unchanged alignment. No definitive acute fracture. Vascular calcifications. Osteopenia. Degenerative changes throughout the DIPs and PIPs. IMPRESSION: Revisualization of sequela of prior impacted fracture of the distal radius and ulna. Evaluation for superimposed acute fracture is limited due to osteopenia and underlying chronic osseous remodeling. No definitive superimposed acute fracture is noted. If persistent clinical concern for scaphoid fracture, recommend immobilization and follow-up radiographs in 2 weeks versus MRI. Electronically Signed   By: March 15, 2020 MD   On: 09/11/2020 19:17   CT HEAD WO CONTRAST  Result Date: 09/12/2020 CLINICAL DATA:  Status post thrombectomy EXAM: CT HEAD WITHOUT CONTRAST TECHNIQUE: Contiguous axial images were obtained from the base of the skull through the vertex without intravenous contrast. COMPARISON:  None. FINDINGS: Brain: There is hyperdensity over the right MCA territory, likely contrast staining. There is generalized atrophy without lobar predilection. There is periventricular hypoattenuation compatible with chronic microvascular disease. Vascular: No abnormal hyperdensity of the major intracranial arteries or dural venous sinuses. No intracranial atherosclerosis. Skull: The visualized skull base, calvarium and extracranial soft tissues are normal. Sinuses/Orbits: No fluid levels or advanced mucosal thickening of the visualized paranasal sinuses. No mastoid or middle ear effusion. The orbits are normal. IMPRESSION: Hyperdensity over the right MCA territory, likely contrast staining. Electronically Signed   By: Deatra Robinson M.D.   On: 09/12/2020 02:55   CT Cervical Spine Wo Contrast  Result Date: 09/11/2020 CLINICAL DATA:  Facial trauma. EXAM:  CT CERVICAL SPINE WITHOUT CONTRAST TECHNIQUE: Multidetector CT imaging of the cervical spine was performed without intravenous contrast. Multiplanar CT image reconstructions were also generated. COMPARISON:  None. FINDINGS: Alignment: No evidence of acute traumatic subluxation. Skull base and vertebrae: No acute cervical spine fracture or suspicious osseous lesion. Mild superior endplate compression fracture at T3, likely chronic. Soft tissues and spinal canal: No prevertebral fluid or swelling. No visible canal hematoma. Disc levels: Moderate to severe disc space narrowing from C4-5 to C6-7. Severe bilateral facet arthrosis at C3-4 with small subchondral cysts or erosions and joint widening on the right. Right facet ankylosis at C4-5. No evidence of high-grade spinal stenosis or high-grade neural foraminal stenosis. Upper chest: No apical lung consolidation or mass. Other: Subcentimeter thyroid nodules for which no imaging follow-up is recommended. Carotid atherosclerosis. IMPRESSION: 1. No acute cervical spine fracture. 2. Advanced cervical disc and facet degeneration. Electronically Signed   By: Sebastian Ache M.D.   On: 09/11/2020 19:01   DG Pelvis Portable  Result Date: 09/11/2020 CLINICAL DATA:  trauma EXAM: PORTABLE PELVIS 1-2 VIEWS COMPARISON:  December 15, 2019 FINDINGS: Osteopenia. Excreted contrast limits evaluation of the sacrum. No acute displaced fracture seen on single view. No pelvic diastasis. Vascular calcifications. Degenerative changes of the lumbar spine. IMPRESSION: No acute fracture on single view. If persistent concern for nondisplaced hip or pelvic fracture, recommend dedicated pelvic MRI. Electronically Signed   By: Meda Klinefelter MD   On: 09/11/2020 19:13   Portable Chest x-ray  Result Date: 09/11/2020 CLINICAL DATA:  87 year old male status post intubation. EXAM: PORTABLE CHEST 1 VIEW COMPARISON:  Earlier radiograph dated 09/11/2020. FINDINGS: Interval placement of an  endotracheal tube with tip approximately 6 cm above the carina. Enteric tube with side-port in the distal esophagus and tip close to the GE junction. Recommend further advancing of the enteric tube by at least additional 10 cm. Diffuse interstitial coarsening and left lung base atelectasis similar to prior radiograph. Stable cardiomediastinal silhouette. Atherosclerotic calcification of the aorta. No acute osseous pathology. IMPRESSION: 1. Endotracheal tube above the carina. 2. Enteric tube with side-port in the distal esophagus and tip close to the GE junction. Recommend further advancing of the enteric tube by at least 10 cm. Electronically Signed   By: Elgie Collard M.D.   On: 09/11/2020 23:08   DG Chest Portable 1 View  Result Date: 09/11/2020 CLINICAL DATA:  trauma EXAM: PORTABLE CHEST 1 VIEW COMPARISON:  October 23, 2019, October 03, 2019 FINDINGS: Evaluation is limited secondary to patient rotation. The cardiomediastinal silhouette is grossly unchanged in contour. No pleural effusion. No pneumothorax. Diffuse interstitial prominence. Visualized abdomen is unremarkable. Compression fracture deformity of the T11, unchanged in comparison to prior. Compression fracture of T9 appears similar comparison to prior. IMPRESSION: Diffuse interstitial prominence  likely reflecting underlying pulmonary edema. Differential considerations include atypical infection. Electronically Signed   By: Meda Klinefelter MD   On: 09/11/2020 19:12   DG Hand Complete Left  Result Date: 09/11/2020 CLINICAL DATA:  fall EXAM: LEFT HAND - COMPLETE 3+ VIEW; LEFT WRIST - COMPLETE 3+ VIEW COMPARISON:  December 15, 2019 common March 13, 2020 FINDINGS: Revisualization of the sequela of an impacted fracture of the distal LEFT radius and ulna. There is mature osseous bridging with scattered residual areas of lucency from prior fracture sites. Fracture fragments are in unchanged alignment. No definitive acute fracture. Vascular  calcifications. Osteopenia. Degenerative changes throughout the DIPs and PIPs. IMPRESSION: Revisualization of sequela of prior impacted fracture of the distal radius and ulna. Evaluation for superimposed acute fracture is limited due to osteopenia and underlying chronic osseous remodeling. No definitive superimposed acute fracture is noted. If persistent clinical concern for scaphoid fracture, recommend immobilization and follow-up radiographs in 2 weeks versus MRI. Electronically Signed   By: Meda Klinefelter MD   On: 09/11/2020 19:17   CT HEAD CODE STROKE WO CONTRAST  Result Date: 09/11/2020 CLINICAL DATA:  Code stroke. Neuro deficit, acute, stroke suspected. EXAM: CT HEAD WITHOUT CONTRAST TECHNIQUE: Contiguous axial images were obtained from the base of the skull through the vertex without intravenous contrast. COMPARISON:  09/27/2019 FINDINGS: Brain: There is no evidence of an acute infarct, intracranial hemorrhage, mass, midline shift, or extra-axial fluid collection. There is mild-to-moderate cerebral atrophy. Patchy hypodensities in the cerebral white matter bilaterally are similar to the prior motion degraded CT and are nonspecific but compatible with moderate chronic small vessel ischemic disease. A chronic infarct is again noted in the left basal ganglia. There is also a small chronic right cerebellar infarct. Vascular: Calcified atherosclerosis at the skull base. No hyperdense vessel. Skull: No fracture or suspicious osseous lesion. Sinuses/Orbits: Paranasal sinuses and mastoid air cells are clear. Bilateral cataract extraction. Other: None. ASPECTS [facility] Stroke Program Early CT Score) - Ganglionic level infarction (caudate, lentiform nuclei, internal capsule, insula, M1-M3 cortex): 7 - Supraganglionic infarction (M4-M6 cortex): 3 Total score (0-10 with 10 being normal): 10 IMPRESSION: 1. No evidence of acute intracranial abnormality. 2. ASPECTS is 10. 3. Moderate chronic small vessel ischemic  disease. These results were communicated to Dr. Selina Cooley at 6:21 pm on 09/11/2020 by text page via the [facility] messaging system. Electronically Signed   By: Sebastian Ache M.D.   On: 09/11/2020 18:23   CT ANGIO HEAD NECK W WO CM W PERF (CODE STROKE)  Result Date: 09/11/2020 CLINICAL DATA:  Neuro deficit, acute, stroke suspected. EXAM: CT ANGIOGRAPHY HEAD AND NECK CT PERFUSION BRAIN TECHNIQUE: Multidetector CT imaging of the head and neck was performed using the standard protocol during bolus administration of intravenous contrast. Multiplanar CT image reconstructions and MIPs were obtained to evaluate the vascular anatomy. Carotid stenosis measurements (when applicable) are obtained utilizing NASCET criteria, using the distal internal carotid diameter as the denominator. Multiphase CT imaging of the brain was performed following IV bolus contrast injection. Subsequent parametric perfusion maps were calculated using RAPID software. CONTRAST:  40mL OMNIPAQUE IOHEXOL 350 MG/ML SOLN COMPARISON:  None. FINDINGS: CTA NECK FINDINGS Aortic arch: Normal variant aortic arch branching pattern with common origin of the brachiocephalic and left common carotid arteries. Mild atherosclerotic plaque without arch vessel origin stenosis. Right carotid system: Patent with predominantly calcified plaque at the carotid bifurcation. No evidence of a significant stenosis or dissection. Left carotid system: Patent with mixed calcified and soft plaque at  the carotid bifurcation. No evidence of a significant stenosis or dissection. Vertebral arteries: Patent with the left being mildly dominant. Scattered atherosclerosis bilaterally resulting in severe proximal V1 and mild V3 stenoses on the left. Skeleton: See separate cervical spine CT report. Other neck: Subcentimeter thyroid nodules for which no imaging follow-up is recommended. Upper chest: Mild motion artifact. No apical lung consolidation or mass. Review of the MIP images confirms the  above findings CTA HEAD FINDINGS Anterior circulation: The internal carotid arteries are patent from skull base to carotid termini with atherosclerotic plaque resulting in moderate bilateral paraclinoid stenoses. The right M1 segment is patent, however there is a proximal M2 occlusion without significant distal reconstitution. The left MCA is patent with a mild M1 stenosis noted. The ACAs are patent with mild-to-moderate stenosis of the left A1 segment as well as moderate irregular narrowing of both A2 segments. No aneurysm is identified. Posterior circulation: The intracranial vertebral arteries are patent to the basilar with atherosclerosis resulting in irregularity bilaterally and a moderate stenosis on the right. The basilar artery is patent with a severe stenosis in its midportion. The right PCA is patent with a severe stenosis near the P1-P2 junction. There are severe stenoses of both PCAs near the P1-P2 junctions with poor flow distally, particularly on the left. No aneurysm is identified. Venous sinuses: As permitted by contrast timing, patent. Anatomic variants: None. Review of the MIP images confirms the above findings CT Brain Perfusion Findings: ASPECTS: 10 CBF (<30%) Volume: 40 mL Perfusion (Tmax>6.0s) volume: 184 mL Mismatch Volume: 144 mL Infarction Location: Right frontal and temporal lobes primarily at the level of the operculum (MCA territory) IMPRESSION: 1. Proximal right M2 occlusion. 2. CTP demonstrates a right MCA core infarct with large penumbra as detailed above. 3. Advanced intracranial atherosclerosis including moderate bilateral ICA stenoses, a severe basilar artery stenosis, and severe bilateral proximal PCA stenoses. 4. Cervical carotid atherosclerosis without significant stenosis. 5. Severe proximal left vertebral artery stenosis. Emergent findings were communicated to Dr. Selina CooleyStack at 6:35 pm on 09/11/2020 by text page via the [facility] messaging system. Electronically Signed   By: Sebastian AcheAllen  Grady  M.D.   On: 09/11/2020 18:51   CT Maxillofacial Wo Contrast  Result Date: 09/11/2020 CLINICAL DATA:  Facial trauma. EXAM: CT MAXILLOFACIAL WITHOUT CONTRAST TECHNIQUE: Multidetector CT imaging of the maxillofacial structures was performed. Multiplanar CT image reconstructions were also generated. COMPARISON:  Head CT 09/27/2019 FINDINGS: Osseous: No acute fracture, mandibular dislocation, or destructive osseous process. Orbits: Bilateral cataract extraction and proptosis. No orbital hematoma or mass. Sinuses: Paranasal sinuses and mastoid air cells are clear. Mild rightward nasal septal deviation. Soft tissues: Punctate left parotid calcification.  Atherosclerosis. Limited intracranial: More fully evaluated on separate head CT. IMPRESSION: No acute maxillofacial fracture. Electronically Signed   By: Sebastian AcheAllen  Grady M.D.   On: 09/11/2020 18:55    ECG - atrial fibrillation - ventricular response 94 BPM (See cardiology reading for complete details)  PHYSICAL EXAM  Temp:  [98.3 F (36.8 C)-99.1 F (37.3 C)] 99.1 F (37.3 C) (07/31 1200) Pulse Rate:  [74-111] 91 (07/31 1500) Resp:  [11-21] 20 (07/31 0600) BP: (90-146)/(59-96) 117/75 (07/31 1500) SpO2:  [76 %-100 %] 100 % (07/31 1500) Weight:  [70.4 kg] 70.4 kg (07/31 0400)  General - No acute distress appears chronically ill. Pulmonary: tolerating room air.   Neuro - PERRL. Left eye proptosis noted. Corneal present, gag and cough present. Speech is dysarthric and hypophonic. Attempts to answer questions but not intelligible. Not able  to assess orientation though likely not oriented. Will track examiner.  Detailed motor exam difficult. Moving all ext spontaneously. In mitts. Sensation, coordination and gait not tested due to mentation.   ASSESSMENT/PLAN Andre Adams is a 87 y.o. male with history of DM2, CKD stage 3a, HTN, HL, hx a fib off AC 2/2 hx GIB, mild cognitive impairment and frequent falls who presented after a fall, non verbal and  L sided weakness at ALF. He did not receive IV t-PA due to head trauma with fall. The pt was taken to IR for intervention for thrombectomy Rt M2 lesion.  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 CT Head - No evidence of acute intracranial abnormality.  CTA H&N - Proximal right M2 occlusion. Advanced intracranial atherosclerosis including moderate bilateral ICA stenoses, a severe basilar artery stenosis, and severe bilateral proximal PCA stenoses.  CTP 40/144 cc, positive for penumbra MRI brain - right MCA large infarct with petechial hemorrhage MRA head - patent right M2 2D Echo EF 65-70% LDL - 100 HgbA1c 7.8 VTE prophylaxis -Lovenox No antithrombotics prior to admission, now on aspirin 325. Will consider eliquis early next week (10 days post stroke) Ongoing aggressive stroke risk factor management Therapy recommendations:  SNF Disposition:  Pending  Chronic Afib Was on Coumadin in the past Coumadin discontinued in 10/2019 due to GI bleeding Follow-up with cardiology, wife and pt declined watchman device  Now on ASA Will consider AC with eliquis in 7 to 10 days post stroke (early next week)  Hx of GIB Per discharge in 10/2019 "Of note, patient had recent GI bleed last month with no source of bleeding found on colonoscopy although did have a polypack to me for multiple submucosal polyps. Was plan for further polyp removal outpatient but has not done so yet. Patient had episode of large-volume hematochezia on 10/26/2019. GI was consulted and originally reccommended observation. Serial CBCs were obtained with downtrending Hgbs, GI performed colonoscopy on 10/29/19 which revealed large fecal impaction with underlying deep ulcers in the rectum (nonbleeding) consistent with stercoral ulcers. This was thought to be the cause of the bleeding. Large borders solid stool was removed in combination with digital  disimpaction, irrigation, and snare and rat-tooth forceps. Patient received Fleet enema x1 and increase MiraLAX daily 34 mg with continuing daily fiber supplementation. Important to maintain a daily aggressive bowel regimen ondischarge. Do not feel above GI findings are contraindicated for [facility], especially now pt had stroke due to not on [facility] Will consider eliquis in 7-10 days post stroke (early next week) Continue ASA for now  Respiratory failure Extubated yesterday. Tolerating room air. Transfer to progressive unit. Family confirmed DNR status again today.  Seizure-like activity EEG neg, keppra stopped.  Hypertension Stable at the low end Off IVF On metoprolol and doxazosin Long-term BP goal normotensive  Hyperlipidemia Home Lipid lowering medication: unknown LDL 100, goal < 70 Current lipid lowering medication: lipitor 40 Continue statin at discharge  Uncontrolled Diabetes Home diabetic meds: unknown Uncontrolled  Hyperglycemia  On lantus and novolog 2U q4h -> 5U q4h SSI  CBG monitoring HgbA1c - 7.8, goal < 7.0 Diabetes coordinator on board PCP close follow up   Other Stroke Risk Factors Advanced age  Other Active Problems Code status - Full code CKD - stage 3b - Cre - 2.5->2.27->2.17->2.07->2.07->2.10 Chronic fractures related to fall - hip, pelvic and LEFT radius and ulna fxs  Mild leukocytosis -  WBCs - 11.4 ->10.1->10.4->10.1->10.9 (afebrile) Frequent falls (2 last year, 1 this year)   Hospital day # 8    I spent 30 minutes  n the care of this patient.  Leticia Penna, MD

## 2020-09-19 NOTE — Evaluation (Signed)
Speech Language Pathology Evaluation Patient Details Name: Andre Adams MRN: [id number] DOB: 1933/09/24 Today's Date: 09/19/2020 Time: 7616-0737 SLP Time Calculation (min) (ACUTE ONLY): 10 min  Problem List:  Patient Active Problem List   Diagnosis Date Noted   Acute respiratory failure with hypoxia (HCC)    Endotracheally intubated 09/12/2020   Acute CVA (cerebrovascular accident) [facility])    AKI (acute kidney injury) (HCC)    Acute ischemic right MCA stroke (HCC) 09/11/2020   Chronic atrial fibrillation (HCC)    Past Medical History: History reviewed. No pertinent past medical history. Past Surgical History:  Past Surgical History:  Procedure Laterality Date   RADIOLOGY WITH ANESTHESIA N/A 09/11/2020   Procedure: IR WITH ANESTHESIA;  Surgeon: Julieanne Cotton, MD;  Location: [facility] OR;  Service: Radiology;  Laterality: N/A;   HPI:  Pt is an 87 y.o. male who presented from an ALF on 09/11/20 s/p fall with L-sided weakness. NIHSS = 20. No tPA administered 2/2 fall with head trauma. 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 ETT 7/23-7/29. PMH: DM2, CKD stage 3a, HTN, HL, hx a fib off AC 2/2 hx GIB   Assessment / Plan / Recommendation Clinical Impression  Pt is awake today and trying to communicate with SLP, although most of what he says is difficult to discern due to the extgent of his dysarthria. He did clearly repeat "Fourth of July" several times although with unclear context. With use of yes/no questions to facilitate intelligibility he showed that he is oriented to person only, but showed some emerging, simple problem solving and reasoning as SLP provided cues to reorient. He did not consistently answer yes/no questions, but when he did, his responses had good accuracy. Pt followed most one-step commands with Min cues and extra time. Recommend ongoing SLP f/u to maximize cognition and communication.    SLP Assessment   SLP Recommendation/Assessment: Patient needs continued Speech Lanaguage Pathology Services SLP Visit Diagnosis: Cognitive communication deficit (R41.841);Dysarthria and anarthria (R47.1)    Follow Up Recommendations  Skilled Nursing facility    Frequency and Duration min 2x/week  2 weeks      SLP Evaluation Cognition  Overall Cognitive Status: Impaired/Different from baseline Arousal/Alertness: Awake/alert Orientation Level: Oriented to person;Disoriented to place;Disoriented to time Attention: Sustained Sustained Attention: Impaired Sustained Attention Impairment: Functional basic Awareness: Impaired Awareness Impairment: Emergent impairment       Comprehension  Auditory Comprehension Overall Auditory Comprehension: Impaired Yes/No Questions: Impaired Basic Biographical Questions: 76-100% accurate Basic Immediate Environment Questions: 50-74% accurate Commands: Impaired One Step Basic Commands: 50-74% accurate Conversation: Simple Interfering Components: Attention EffectiveTechniques: Repetition    Expression Expression Primary Mode of Expression: Verbal Verbal Expression Overall Verbal Expression:  (difficult to assess given level of dysarthria)   Oral / Motor  Motor Speech Overall Motor Speech: Impaired Respiration: Within functional limits Phonation: Low vocal intensity Resonance: Within functional limits Articulation: Impaired Level of Impairment: Word Intelligibility: Intelligibility reduced Word: 50-74% accurate Phrase: 25-49% accurate   GO                    Mahala Menghini., M.A. CCC-SLP Acute Rehabilitation Services Pager [phone] Office [phone]  09/19/2020, 10:14 AM

## 2020-09-19 NOTE — Progress Notes (Signed)
NAME:  GARNER DULLEA, MRN:  [id number], DOB:  1933-12-08, LOS: 8 ADMISSION DATE:  09/11/2020, CONSULTATION DATE:  7/23 REFERRING MD:  Selina Cooley MD, CHIEF COMPLAINT:   fall and left sided weakness   Brief HPI:  Goes by [name]"  18 yoM with prior hx of HTN, HL, Afib off AC due to GIB HX, CKD3, frequent falls presented 7/23 form [facility] nursing home with left sided weakness with flaccid L dide arm/face/leg and was nonverbal.  LKW 1445. NIHSS score 23. TPA was not give due to fall with head trauma. CTA head showed R MCA stroke secondary to a R M2 occlusion. He was taken to [facility] with Dr. Corliss Skains.  Returned to ICU intubated, PCCM following.   Pertinent  Medical History  HTN, HL, Afib off AC due to GIB HX, CKD3, frequent falls  Significant Hospital Events:  7/23 Admitted with acute R M2 occlusion s/p NIR 7/24 loaded with keppra for R sided twitching/ EEG, MAE to noxious stimuli, followed LE command for RN; off cleviprex gtt early am; weaned all day PSV 5/5, mental status barrier to extuation 7/29 Patient continues to tolerate SBT well but his mentation waxes and weans this coupled with oral secretions is concerning for ability to protect airway post extubation  7/29 extubated  Interim History / Subjective:  This morning he is awake and can hold attention to respond to simple questions. Was telling nurse and other therapies to leave him alone earlier.  Objective   Blood pressure (!) 90/59, pulse 90, temperature 99.1 F (37.3 C), temperature source Oral, resp. rate 20, height 5\' 7"  (1.702 m), weight 70.4 kg, SpO2 99 %.        Intake/Output Summary (Last 24 hours) at 09/19/2020 1409 Last data filed at 09/19/2020 1300 Gross per 24 hour  Intake 1380 ml  Output 1000 ml  Net 380 ml   Filed Weights   09/17/20 0415 09/18/20 0343 09/19/20 0400  Weight: 66.6 kg 70.3 kg 70.4 kg   Examination: General: Acute on chronically ill appearing elderly male lying in bed, NAD HEENT: cortrak in  place.  Neuro: more calm than yesterday, follows commands intermittently delirious CV: s1s2 regular rate and rhythm, no murmur, rubs, or gallops,  PULM:  no respiratory distress, poor cough mechanics GI: scaphoid, soft Extremities: warm/dry, no edema  Skin: multiple ecchymoses noted on upper extremities  Labs reviewed Glucose 228 K 4.7 Na 143 Cr 2 Repeat labs pending today  Resolved Hospital Problem list   Concern for seizure: EEG no seizure; keppra stopped per neuro Acute respiratory failure AKI  Assessment & Plan:  R MCA stroke secondary to a R M2 occlusion S/P thrombectomy -Likely related to Afib off AC given prior GIB 10/2019 P: Management per neurology  Maintain neuro protective measures; goal for eurothermia, euglycemia, eunatermia, normoxia, and PCO2 goal of 35-40 Nutrition and bowel regiment  Seizure precautions  AEDs per neurology  Aspirations precautions  SBP goal < 160  HX Atrial fibrillation off AC due to GIB hx -Afib on EKG P: Remains on beta blocker with rate control Continuous telemetry  ASA and AC plan as above  Neurology plans to resume eliquis soon as his GI bleed was due to rectal bleeding from stool impaction (now resolved)  Poor cough Mechanics - hold anti-cholinergics for worsening secretion impaction - OOB to improve atelectasis and strength - frequent suctioning and pulmonary toilet  DM2 P: Continue SSI CBG checks q4hrs  CKD3 Baseline around 2 P: Follow renal function  Monitor urine output Trend Bmet Avoid nephrotoxins Ensure adequate renal perfusion   Best Practice:   Diet/type: NPO per SLP, TF going.  DVT prophylaxis: SCD; lovenox (monitor renal function closely)  GI prophylaxis: PPI Lines: N/A Foley:  N/A; monitor for urinary retention  Code Status:  DNR Last date of multidisciplinary goals of care discussion: 7/29 talked with wife and family. Patient would not want trach and life support or LTACH. Plan to continue all  current care, but in the event of respiratory decline, transition to comfort measures. If he is doing well now need to determine ultimate disposition after discussing with therapies - CIR candidate?   Durel Salts, MD Pulmonary and Critical Care Medicine [facility]

## 2020-09-19 NOTE — Progress Notes (Addendum)
Patient transported from 4N by Gabriel Carina RN. Vitals WNL, telemetry set up and monitor room notified, and verified. Jevity 1.2 @60  restarted, assessment completed, skin checked. Patient in bilateral mitts, attempting to pull at lines, mitts effective. Patient noted to have skin tear on L forearm, mepi foam dressing applied. Patient resting comfortably. Bed alarm set, bed in lowest position, call bell in reach.

## 2020-09-20 DIAGNOSIS — I63511 Cerebral infarction due to unspecified occlusion or stenosis of right middle cerebral artery: Secondary | ICD-10-CM | POA: Diagnosis not present

## 2020-09-20 LAB — GLUCOSE, CAPILLARY
Glucose-Capillary: 140 mg/dL — ABNORMAL HIGH (ref 70–99)
Glucose-Capillary: 156 mg/dL — ABNORMAL HIGH (ref 70–99)
Glucose-Capillary: 163 mg/dL — ABNORMAL HIGH (ref 70–99)
Glucose-Capillary: 170 mg/dL — ABNORMAL HIGH (ref 70–99)
Glucose-Capillary: 183 mg/dL — ABNORMAL HIGH (ref 70–99)
Glucose-Capillary: 214 mg/dL — ABNORMAL HIGH (ref 70–99)
Glucose-Capillary: 232 mg/dL — ABNORMAL HIGH (ref 70–99)

## 2020-09-20 LAB — BASIC METABOLIC PANEL
Anion gap: 8 (ref 5–15)
BUN: 58 mg/dL — ABNORMAL HIGH (ref 8–23)
CO2: 24 mmol/L (ref 22–32)
Calcium: 8.7 mg/dL — ABNORMAL LOW (ref 8.9–10.3)
Chloride: 111 mmol/L (ref 98–111)
Creatinine, Ser: 1.85 mg/dL — ABNORMAL HIGH (ref 0.61–1.24)
GFR, Estimated: 35 mL/min — ABNORMAL LOW (ref >60)
Glucose, Bld: 196 mg/dL — ABNORMAL HIGH (ref 70–99)
Potassium: 4 mmol/L (ref 3.5–5.1)
Sodium: 143 mmol/L (ref 135–145)

## 2020-09-20 MED ORDER — FREE WATER
100.0000 mL | Status: DC
  Administered 2020-09-20 – 2020-09-21 (×7): 100 mL

## 2020-09-20 NOTE — Progress Notes (Signed)
Physical Therapy Treatment Patient Details Name: Andre Adams MRN: [id number] DOB: [birth year] Today's Date: 09/20/2020    History of Present Illness Pt is an 87 y.o. male who presented from an ALF on 09/11/20 s/p fall with L-sided weakness. NIHSS = 20. No tPA administered 2/2 fall with head trauma. 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 Extubated 7/29. PMH: DM2, CKD stage 3a, HTN, HL, hx a fib off AC 2/2 hx GIB    PT Comments    Pt restless upon arrival to room, talking unintelligibly for a majority of session and completely disoriented. Pt requiring max-total assist for bed mobility, EOB sitting x1 minute and would not tolerate further, pt with heavy L lateral leaning and resistant to continued upright. Pt soiled in BM, PT assisting with pericare as pt unable. SNF remains appropriate dispo at this time.    Follow Up Recommendations  SNF     Equipment Recommendations  Other (comment) (defer to post acute venue)    Recommendations for Other Services       Precautions / Restrictions Precautions Precautions: Fall Precaution Comments: L wrist ace-wrapped, written on board "L wrist NWB and splnt until 8/4"    Mobility  Bed Mobility Overal bed mobility: Needs Assistance Bed Mobility: Supine to Sit;Sit to Supine;Rolling Rolling: Max assist   Supine to sit: Max assist Sit to supine: Max assist   General bed mobility comments: max assist for rolling bilat for pericare, pt very resistant with roll away from L sidelying. max assist for supine<>sit x2, after first attempt pt laying self back in bed.    Transfers                 General transfer comment: NT - poor sitting tolerance and balance  Ambulation/Gait                 Stairs             Wheelchair Mobility    Modified Rankin (Stroke Patients Only) Modified Rankin (Stroke Patients Only) Pre-Morbid Rankin Score: Moderate  disability Modified Rankin: Severe disability     Balance Overall balance assessment: Needs assistance Sitting-balance support: Bilateral upper extremity supported;Feet supported Sitting balance-Leahy Scale: Poor Sitting balance - Comments: heavy L lateral leaning propping on L elbow, max assist to correct Postural control: Left lateral lean                                  Cognition Arousal/Alertness: Lethargic Behavior During Therapy: Restless;Anxious Overall Cognitive Status: Impaired/Different from baseline Area of Impairment: Orientation;Attention;Memory;Following commands               Rancho Levels of Cognitive Functioning Rancho Los Amigos Scales of Cognitive Functioning: Confused/inappropriate/non-agitated Orientation Level: Disoriented to;Place;Time;Situation Current Attention Level: Sustained Memory: Decreased short-term memory Following Commands: Follows one step commands inconsistently Safety/Judgement: Decreased awareness of safety;Decreased awareness of deficits Awareness: Intellectual Problem Solving: Slow processing;Decreased initiation;Requires verbal cues;Requires tactile cues;Difficulty sequencing General Comments: pt restless upon PT arrival, fidgeting with handmitts. Pt speaking mostly unintelligibly, PT able to make out "I'm in a brick building" and "that hurts" during pericare. Pt jumping whenever PT assists pt, even with verbal instruction.      Exercises      General Comments        Pertinent Vitals/Pain Pain Assessment: Faces Faces Pain Scale: Hurts little more Pain Location: buttocks, during  pericare Pain Descriptors / Indicators: Grimacing;Sore Pain Intervention(s): Limited activity within patient's tolerance;Monitored during session;Repositioned    Home Living                      Prior Function            PT Goals (current goals can now be found in the care plan section) Acute Rehab PT Goals PT Goal  Formulation: Patient unable to participate in goal setting Time For Goal Achievement: 09/30/20 Potential to Achieve Goals: Fair Progress towards PT goals: Not progressing toward goals - comment (resistant to mobility, unable to progress OOB)    Frequency    Min 3X/week      PT Plan Current plan remains appropriate    Co-evaluation              AM-PAC PT "6 Clicks" Mobility   Outcome Measure  Help needed turning from your back to your side while in a flat bed without using bedrails?: Total Help needed moving from lying on your back to sitting on the side of a flat bed without using bedrails?: Total Help needed moving to and from a bed to a chair (including a wheelchair)?: Total Help needed standing up from a chair using your arms (e.g., wheelchair or bedside chair)?: Total Help needed to walk in hospital room?: Total Help needed climbing 3-5 steps with a railing? : Total 6 Click Score: 6    End of Session Equipment Utilized During Treatment: Other (comment) (bilat mitts) Activity Tolerance: Patient limited by fatigue;Other (comment) (pt anxiety) Patient left: in bed;with call bell/phone within reach;with bed alarm set;with nursing/sitter in room (NT at bedside) Nurse Communication: Mobility status;Other (comment);Need for lift equipment (sats) PT Visit Diagnosis: Muscle weakness (generalized) (M62.81);History of falling (Z91.81);Difficulty in walking, not elsewhere classified (R26.2);Other symptoms and signs involving the nervous system (R29.898);Apraxia (R48.2);Unsteadiness on feet (R26.81)     Time: 1740-8144 PT Time Calculation (min) (ACUTE ONLY): 17 min  Charges:  $Therapeutic Activity: 8-22 mins                    Marye Round, PT DPT Acute Rehabilitation Services Pager [phone]  Office [phone]   Tyrone Apple E Christain [place] 09/20/2020, 4:22 PM

## 2020-09-20 NOTE — Progress Notes (Addendum)
Nutrition Follow-up  DOCUMENTATION CODES:   Not applicable  INTERVENTION:  Continue TF via Cortrak: -Jevity 1.2 @ 49m/hr (14419md) -4540mrosource TF TID -100m72mee water Q4H  Provides 1848 kcals, 112 gm protein, 1167ml42me water daily (1767ml 24ml free water with flushes)  NUTRITION DIAGNOSIS:   Moderate Malnutrition related to chronic illness as evidenced by moderate fat depletion, moderate muscle depletion, severe muscle depletion.  ongoing  GOAL:   Patient will meet greater than or equal to 90% of their needs  Met with TF  MONITOR:   TF tolerance  REASON FOR ASSESSMENT:   Consult, Ventilator Enteral/tube feeding initiation and management  ASSESSMENT:   Pt with PMH of uncontrolled DM, CKD stage 3, HTN, HLD, afib, GIB, and frequent falls admitted from ALF with  large R MCA stroke.  7/23 s/p IR for thrombectomy; intubated; OGT placed 7/25 OGT removed 7/26 IR placed small bore NGT, later removed 7/27 cortrak replaced (gastric tip per xray) 7/29 extubated  Pt transported from 4N last night, TF was restarted at goal without issue per RN. Pt telling staff to leave him alone.   Current TF: Jevity 1.2 @ 60, 45ml P53murce Tf TID  Medications: SSI, protonix Labs: Cr 1.85 (H) CBGs 183-156-170  UOP: 800ml x269murs I/O: +3.3L since admit  Admission weight: 69.7 kg Current weight: 70.4 kg  Diet Order:   Diet Order             Diet NPO time specified  Diet effective now                   EDUCATION NEEDS:   Not appropriate for education at this time  Skin:  Skin Assessment: Reviewed RN Assessment  Last BM:  7/31  Height:   Ht Readings from Last 1 Encounters:  09/11/20 _0  (1.702 m)    Weight:   Wt Readings from Last 1 Encounters:  09/19/20 70.4 kg    BMI:  Body mass index is 24.31 kg/m.  Estimated Nutritional Needs:   Kcal:  1800-2000  Protein:  100-120 grams  Fluid:  >1.8 L/day    Andre Adams ALarkin Ina, LDN  (she/her/hers) RD pager number and weekend/on-call pager number located in [employer].[place]

## 2020-09-20 NOTE — Progress Notes (Signed)
Patient ID: Andre Adams, male   DOB: 07-19-1933, 87 y.o.   MRN: [id number] Patient ID: Andre Adams, male   DOB: [birth year], 87 y.o.   MRN: [id number] STROKE TEAM PROGRESS NOTE   INTERVAL HISTORY Patient is lying in bed resting comfortably.  He is arousable and follows simple commands.  Vital signs are stable.  BMP from today shows stable creatinine of 1.85 and elevated glucose of 196.  OBJECTIVE Vitals:   09/20/20 0348 09/20/20 0810 09/20/20 0820 09/20/20 1127  BP: 112/71 (!) 139/92  124/73  Pulse: 87 (!) 108  90  Resp: Temp: 97.9 F (36.6 C) (!) 97.5 F (36.4 C)  99.9 F (37.7 C)  TempSrc: Oral Oral  Oral  SpO2: 98% (!) 89% 92% 96%  Weight:      Height:        CBC:  Recent Labs  Lab 09/16/20 0750 09/17/20 0200  WBC 10.1 10.9*  HGB 12.5* 12.3*  HCT 38.9* 37.8*  MCV 101.3* 100.3*  PLT 154 161    Basic Metabolic Panel:  Recent Labs  Lab 09/14/20 0605 09/15/20 0248 09/16/20 0750 09/17/20 0200 09/18/20 0351 09/19/20 1642 09/20/20 0244  NA 141   < > 141 140   < > 145 143  K 4.0   < > 4.3 4.0   < > 4.1 4.0  CL 110   < > 111 108   < > 113* 111  CO2 22   < > 24 22   < > 22 24  GLUCOSE 112*   < > 270* 223*   < > 202* 196*  BUN 40*   < > 46* 51*   < > 58* 58*  CREATININE 2.17*   < > 2.07* 2.10*   < > 1.90* 1.85*  CALCIUM 8.3*   < > 8.5* 8.5*   < > 8.7* 8.7*  MG 1.8  --  2.2  --   --   --   --   PHOS 3.4   < > 3.3 3.0  --   --   --    < > = values in this interval not displayed.    Lipid Panel:     Component Value Date/Time   CHOL 147 09/12/2020 0417   TRIG 94 09/16/2020 0750   HDL 27 (L) 09/12/2020 0417   CHOLHDL 5.4 09/12/2020 0417   VLDL 20 09/12/2020 0417   LDLCALC 100 (H) 09/12/2020 0417   HgbA1c:  Lab Results  Component Value Date   HGBA1C 7.8 (H) 09/12/2020   Urine Drug Screen: No results found for: LABOPIA, COCAINSCRNUR, LABBENZ, AMPHETMU, THCU, LABBARB  Alcohol Level     Component Value Date/Time   ETH <10 09/11/2020 1845     IMAGING  No results found.   DG Wrist Complete Left  Result Date: 09/11/2020 CLINICAL DATA:  fall EXAM: LEFT HAND - COMPLETE 3+ VIEW; LEFT WRIST - COMPLETE 3+ VIEW COMPARISON:  December 15, 2019 common March 13, 2020 FINDINGS: Revisualization of the sequela of an impacted fracture of the distal LEFT radius and ulna. There is mature osseous bridging with scattered residual areas of lucency from prior fracture sites. Fracture fragments are in unchanged alignment. No definitive acute fracture. Vascular calcifications. Osteopenia. Degenerative changes throughout the DIPs and PIPs. IMPRESSION: Revisualization of sequela of prior impacted fracture of the distal radius and ulna. Evaluation for superimposed acute fracture is limited due to osteopenia and underlying chronic osseous remodeling. No definitive superimposed acute  fracture is noted. If persistent clinical concern for scaphoid fracture, recommend immobilization and follow-up radiographs in 2 weeks versus MRI. Electronically Signed   By: Meda KlinefelterStephanie  Peacock MD   On: 09/11/2020 19:17   CT HEAD WO CONTRAST  Result Date: 09/12/2020 CLINICAL DATA:  Status post thrombectomy EXAM: CT HEAD WITHOUT CONTRAST TECHNIQUE: Contiguous axial images were obtained from the base of the skull through the vertex without intravenous contrast. COMPARISON:  None. FINDINGS: Brain: There is hyperdensity over the right MCA territory, likely contrast staining. There is generalized atrophy without lobar predilection. There is periventricular hypoattenuation compatible with chronic microvascular disease. Vascular: No abnormal hyperdensity of the major intracranial arteries or dural venous sinuses. No intracranial atherosclerosis. Skull: The visualized skull base, calvarium and extracranial soft tissues are normal. Sinuses/Orbits: No fluid levels or advanced mucosal thickening of the visualized paranasal sinuses. No mastoid or middle ear effusion. The orbits are normal.  IMPRESSION: Hyperdensity over the right MCA territory, likely contrast staining. Electronically Signed   By: Deatra RobinsonKevin  Herman M.D.   On: 09/12/2020 02:55   CT Cervical Spine Wo Contrast  Result Date: 09/11/2020 CLINICAL DATA:  Facial trauma. EXAM: CT CERVICAL SPINE WITHOUT CONTRAST TECHNIQUE: Multidetector CT imaging of the cervical spine was performed without intravenous contrast. Multiplanar CT image reconstructions were also generated. COMPARISON:  None. FINDINGS: Alignment: No evidence of acute traumatic subluxation. Skull base and vertebrae: No acute cervical spine fracture or suspicious osseous lesion. Mild superior endplate compression fracture at T3, likely chronic. Soft tissues and spinal canal: No prevertebral fluid or swelling. No visible canal hematoma. Disc levels: Moderate to severe disc space narrowing from C4-5 to C6-7. Severe bilateral facet arthrosis at C3-4 with small subchondral cysts or erosions and joint widening on the right. Right facet ankylosis at C4-5. No evidence of high-grade spinal stenosis or high-grade neural foraminal stenosis. Upper chest: No apical lung consolidation or mass. Other: Subcentimeter thyroid nodules for which no imaging follow-up is recommended. Carotid atherosclerosis. IMPRESSION: 1. No acute cervical spine fracture. 2. Advanced cervical disc and facet degeneration. Electronically Signed   By: Sebastian AcheAllen  Grady M.D.   On: 09/11/2020 19:01   DG Pelvis Portable  Result Date: 09/11/2020 CLINICAL DATA:  trauma EXAM: PORTABLE PELVIS 1-2 VIEWS COMPARISON:  December 15, 2019 FINDINGS: Osteopenia. Excreted contrast limits evaluation of the sacrum. No acute displaced fracture seen on single view. No pelvic diastasis. Vascular calcifications. Degenerative changes of the lumbar spine. IMPRESSION: No acute fracture on single view. If persistent concern for nondisplaced hip or pelvic fracture, recommend dedicated pelvic MRI. Electronically Signed   By: Meda KlinefelterStephanie  Peacock MD   On:  09/11/2020 19:13   Portable Chest x-ray  Result Date: 09/11/2020 CLINICAL DATA:  87 year old male status post intubation. EXAM: PORTABLE CHEST 1 VIEW COMPARISON:  Earlier radiograph dated 09/11/2020. FINDINGS: Interval placement of an endotracheal tube with tip approximately 6 cm above the carina. Enteric tube with side-port in the distal esophagus and tip close to the GE junction. Recommend further advancing of the enteric tube by at least additional 10 cm. Diffuse interstitial coarsening and left lung base atelectasis similar to prior radiograph. Stable cardiomediastinal silhouette. Atherosclerotic calcification of the aorta. No acute osseous pathology. IMPRESSION: 1. Endotracheal tube above the carina. 2. Enteric tube with side-port in the distal esophagus and tip close to the GE junction. Recommend further advancing of the enteric tube by at least 10 cm. Electronically Signed   By: Elgie CollardArash  Radparvar M.D.   On: 09/11/2020 23:08   DG Chest Portable  1 View  Result Date: 09/11/2020 CLINICAL DATA:  trauma EXAM: PORTABLE CHEST 1 VIEW COMPARISON:  October 23, 2019, October 03, 2019 FINDINGS: Evaluation is limited secondary to patient rotation. The cardiomediastinal silhouette is grossly unchanged in contour. No pleural effusion. No pneumothorax. Diffuse interstitial prominence. Visualized abdomen is unremarkable. Compression fracture deformity of the T11, unchanged in comparison to prior. Compression fracture of T9 appears similar comparison to prior. IMPRESSION: Diffuse interstitial prominence likely reflecting underlying pulmonary edema. Differential considerations include atypical infection. Electronically Signed   By: Meda Klinefelter MD   On: 09/11/2020 19:12   DG Hand Complete Left  Result Date: 09/11/2020 CLINICAL DATA:  fall EXAM: LEFT HAND - COMPLETE 3+ VIEW; LEFT WRIST - COMPLETE 3+ VIEW COMPARISON:  December 15, 2019 common March 13, 2020 FINDINGS: Revisualization of the sequela of an impacted  fracture of the distal LEFT radius and ulna. There is mature osseous bridging with scattered residual areas of lucency from prior fracture sites. Fracture fragments are in unchanged alignment. No definitive acute fracture. Vascular calcifications. Osteopenia. Degenerative changes throughout the DIPs and PIPs. IMPRESSION: Revisualization of sequela of prior impacted fracture of the distal radius and ulna. Evaluation for superimposed acute fracture is limited due to osteopenia and underlying chronic osseous remodeling. No definitive superimposed acute fracture is noted. If persistent clinical concern for scaphoid fracture, recommend immobilization and follow-up radiographs in 2 weeks versus MRI. Electronically Signed   By: Meda Klinefelter MD   On: 09/11/2020 19:17   CT HEAD CODE STROKE WO CONTRAST  Result Date: 09/11/2020 CLINICAL DATA:  Code stroke. Neuro deficit, acute, stroke suspected. EXAM: CT HEAD WITHOUT CONTRAST TECHNIQUE: Contiguous axial images were obtained from the base of the skull through the vertex without intravenous contrast. COMPARISON:  09/27/2019 FINDINGS: Brain: There is no evidence of an acute infarct, intracranial hemorrhage, mass, midline shift, or extra-axial fluid collection. There is mild-to-moderate cerebral atrophy. Patchy hypodensities in the cerebral white matter bilaterally are similar to the prior motion degraded CT and are nonspecific but compatible with moderate chronic small vessel ischemic disease. A chronic infarct is again noted in the left basal ganglia. There is also a small chronic right cerebellar infarct. Vascular: Calcified atherosclerosis at the skull base. No hyperdense vessel. Skull: No fracture or suspicious osseous lesion. Sinuses/Orbits: Paranasal sinuses and mastoid air cells are clear. Bilateral cataract extraction. Other: None. ASPECTS [facility] Stroke Program Early CT Score) - Ganglionic level infarction (caudate, lentiform nuclei, internal capsule, insula,  M1-M3 cortex): 7 - Supraganglionic infarction (M4-M6 cortex): 3 Total score (0-10 with 10 being normal): 10 IMPRESSION: 1. No evidence of acute intracranial abnormality. 2. ASPECTS is 10. 3. Moderate chronic small vessel ischemic disease. These results were communicated to Dr. Selina Cooley at 6:21 pm on 09/11/2020 by text page via the [facility] messaging system. Electronically Signed   By: Sebastian Ache M.D.   On: 09/11/2020 18:23   CT ANGIO HEAD NECK W WO CM W PERF (CODE STROKE)  Result Date: 09/11/2020 CLINICAL DATA:  Neuro deficit, acute, stroke suspected. EXAM: CT ANGIOGRAPHY HEAD AND NECK CT PERFUSION BRAIN TECHNIQUE: Multidetector CT imaging of the head and neck was performed using the standard protocol during bolus administration of intravenous contrast. Multiplanar CT image reconstructions and MIPs were obtained to evaluate the vascular anatomy. Carotid stenosis measurements (when applicable) are obtained utilizing NASCET criteria, using the distal internal carotid diameter as the denominator. Multiphase CT imaging of the brain was performed following IV bolus contrast injection. Subsequent parametric perfusion maps were calculated using  RAPID software. CONTRAST:  40mL OMNIPAQUE IOHEXOL 350 MG/ML SOLN COMPARISON:  None. FINDINGS: CTA NECK FINDINGS Aortic arch: Normal variant aortic arch branching pattern with common origin of the brachiocephalic and left common carotid arteries. Mild atherosclerotic plaque without arch vessel origin stenosis. Right carotid system: Patent with predominantly calcified plaque at the carotid bifurcation. No evidence of a significant stenosis or dissection. Left carotid system: Patent with mixed calcified and soft plaque at the carotid bifurcation. No evidence of a significant stenosis or dissection. Vertebral arteries: Patent with the left being mildly dominant. Scattered atherosclerosis bilaterally resulting in severe proximal V1 and mild V3 stenoses on the left. Skeleton: See separate  cervical spine CT report. Other neck: Subcentimeter thyroid nodules for which no imaging follow-up is recommended. Upper chest: Mild motion artifact. No apical lung consolidation or mass. Review of the MIP images confirms the above findings CTA HEAD FINDINGS Anterior circulation: The internal carotid arteries are patent from skull base to carotid termini with atherosclerotic plaque resulting in moderate bilateral paraclinoid stenoses. The right M1 segment is patent, however there is a proximal M2 occlusion without significant distal reconstitution. The left MCA is patent with a mild M1 stenosis noted. The ACAs are patent with mild-to-moderate stenosis of the left A1 segment as well as moderate irregular narrowing of both A2 segments. No aneurysm is identified. Posterior circulation: The intracranial vertebral arteries are patent to the basilar with atherosclerosis resulting in irregularity bilaterally and a moderate stenosis on the right. The basilar artery is patent with a severe stenosis in its midportion. The right PCA is patent with a severe stenosis near the P1-P2 junction. There are severe stenoses of both PCAs near the P1-P2 junctions with poor flow distally, particularly on the left. No aneurysm is identified. Venous sinuses: As permitted by contrast timing, patent. Anatomic variants: None. Review of the MIP images confirms the above findings CT Brain Perfusion Findings: ASPECTS: 10 CBF (<30%) Volume: 40 mL Perfusion (Tmax>6.0s) volume: 184 mL Mismatch Volume: 144 mL Infarction Location: Right frontal and temporal lobes primarily at the level of the operculum (MCA territory) IMPRESSION: 1. Proximal right M2 occlusion. 2. CTP demonstrates a right MCA core infarct with large penumbra as detailed above. 3. Advanced intracranial atherosclerosis including moderate bilateral ICA stenoses, a severe basilar artery stenosis, and severe bilateral proximal PCA stenoses. 4. Cervical carotid atherosclerosis without  significant stenosis. 5. Severe proximal left vertebral artery stenosis. Emergent findings were communicated to Dr. Selina Cooley at 6:35 pm on 09/11/2020 by text page via the [facility] messaging system. Electronically Signed   By: Sebastian Ache M.D.   On: 09/11/2020 18:51   CT Maxillofacial Wo Contrast  Result Date: 09/11/2020 CLINICAL DATA:  Facial trauma. EXAM: CT MAXILLOFACIAL WITHOUT CONTRAST TECHNIQUE: Multidetector CT imaging of the maxillofacial structures was performed. Multiplanar CT image reconstructions were also generated. COMPARISON:  Head CT 09/27/2019 FINDINGS: Osseous: No acute fracture, mandibular dislocation, or destructive osseous process. Orbits: Bilateral cataract extraction and proptosis. No orbital hematoma or mass. Sinuses: Paranasal sinuses and mastoid air cells are clear. Mild rightward nasal septal deviation. Soft tissues: Punctate left parotid calcification.  Atherosclerosis. Limited intracranial: More fully evaluated on separate head CT. IMPRESSION: No acute maxillofacial fracture. Electronically Signed   By: Sebastian Ache M.D.   On: 09/11/2020 18:55    ECG - atrial fibrillation - ventricular response 94 BPM (See cardiology reading for complete details)  PHYSICAL EXAM  Temp:  [97.5 F (36.4 C)-99.9 F (37.7 C)] 99.9 F (37.7 C) (08/01 1127) Pulse Rate:  [  87-108] 90 (08/01 1127) Resp:  [17-18] 18 (08/01 1127) BP: (112-139)/(71-97) 124/73 (08/01 1127) SpO2:  [89 %-100 %] 96 % (08/01 1127)  General -elderly Caucasian male appears chronically ill. Pulmonary: tolerating room air.   Neuro -patient is drowsy but can be aroused with some difficulty.  Mumbles a few words but is dysarthric and difficult to understand PERRL. Left eye proptosis noted. Corneal present, gag and cough present.  . Attempts to answer questions but not intelligible. Not able to assess orientation though likely not oriented. Will track examiner.  Detailed motor exam difficult. Moving all ext spontaneously. In  restraints Sensation, coordination and gait not tested due to mentation.   ASSESSMENT/PLAN Mr. ALWALEED OBESO is a 87 y.o. male with history of DM2, CKD stage 3a, HTN, HL, hx a fib off AC 2/2 hx GIB, mild cognitive impairment and frequent falls who presented after a fall, non verbal and L sided weakness at ALF. He did not receive IV t-PA due to head trauma with fall. The pt was taken to IR for intervention for thrombectomy Rt M2 lesion.  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 CT Head - No evidence of acute intracranial abnormality.  CTA H&N - Proximal right M2 occlusion. Advanced intracranial atherosclerosis including moderate bilateral ICA stenoses, a severe basilar artery stenosis, and severe bilateral proximal PCA stenoses.  CTP 40/144 cc, positive for penumbra MRI brain - right MCA large infarct with petechial hemorrhage MRA head - patent right M2 2D Echo EF 65-70% LDL - 100 HgbA1c 7.8 VTE prophylaxis -Lovenox No antithrombotics prior to admission, now on aspirin 325. Will consider eliquis early next week (10 days post stroke) Ongoing aggressive stroke risk factor management Therapy recommendations:  SNF Disposition:  Pending  Chronic Afib Was on Coumadin in the past Coumadin discontinued in 10/2019 due to GI bleeding Follow-up with cardiology, wife and pt declined watchman device  Now on ASA Will consider AC with eliquis in 7 to 10 days post stroke (early next week)  Hx of GIB Per discharge in 10/2019 "Of note, patient had recent GI bleed last month with no source of bleeding found on colonoscopy although did have a polypack to me for multiple submucosal polyps. Was plan for further polyp removal outpatient but has not done so yet. Patient had episode of large-volume hematochezia on 10/26/2019. GI was consulted and originally reccommended observation. Serial CBCs were obtained with  downtrending Hgbs, GI performed colonoscopy on 10/29/19 which revealed large fecal impaction with underlying deep ulcers in the rectum (nonbleeding) consistent with stercoral ulcers. This was thought to be the cause of the bleeding. Large borders solid stool was removed in combination with digital disimpaction, irrigation, and snare and rat-tooth forceps. Patient received Fleet enema x1 and increase MiraLAX daily 34 mg with continuing daily fiber supplementation. Important to maintain a daily aggressive bowel regimen ondischarge. Do not feel above GI findings are contraindicated for [facility], especially now pt had stroke due to not on [facility] Will consider eliquis in 7-10 days post stroke (early next week) Continue ASA for now  Respiratory failure Extubated yesterday. Tolerating room air. Transfer to progressive unit. Family confirmed DNR status again today.  Seizure-like activity EEG neg, keppra stopped.  Hypertension Stable at the low end Off IVF On metoprolol and doxazosin Long-term BP goal normotensive  Hyperlipidemia Home Lipid lowering medication: unknown LDL 100, goal < 70 Current lipid lowering medication: lipitor 40 Continue  statin at discharge  Uncontrolled Diabetes Home diabetic meds: unknown Uncontrolled  Hyperglycemia  On lantus and novolog 2U q4h -> 5U q4h SSI  CBG monitoring HgbA1c - 7.8, goal < 7.0 Diabetes coordinator on board PCP close follow up   Other Stroke Risk Factors Advanced age  Other Active Problems Code status - Full code CKD - stage 3b - Cre - 2.5->2.27->2.17->2.07->2.07->2.10->1.90->1.85 Chronic fractures related to fall - hip, pelvic and LEFT radius and ulna fxs  Mild leukocytosis - WBCs - 11.4 ->10.1->10.4->10.1->10.9 (temp 99.9) Frequent falls (2 last year, 1 this year) Elevated MCV - check B12 and folate levels  Patient seems to be breathing all right now to 3 days postextubation and neurological exam is stable.  Plan repeat CT head tomorrow likely  start Eliquis.  Continue core track tube feeding and speech therapy to follow him may need to consider PEG tube if swallow function does not improve soon.  Long discussion over the phone with the patient's wife and answered questions.  Greater than 50% time during this 25-minute visit was spent on counseling and coordination of care and discussion with care team and family and answering questions  Delia Heady, MD Hospital day # 9

## 2020-09-21 ENCOUNTER — Inpatient Hospital Stay (HOSPITAL_COMMUNITY): Payer: HMO

## 2020-09-21 ENCOUNTER — Encounter (HOSPITAL_COMMUNITY): Payer: Self-pay | Admitting: Neurology

## 2020-09-21 DIAGNOSIS — I63511 Cerebral infarction due to unspecified occlusion or stenosis of right middle cerebral artery: Secondary | ICD-10-CM | POA: Diagnosis not present

## 2020-09-21 LAB — FOLATE: Folate: 27.2 ng/mL (ref >5.9)

## 2020-09-21 LAB — GLUCOSE, CAPILLARY
Glucose-Capillary: 156 mg/dL — ABNORMAL HIGH (ref 70–99)
Glucose-Capillary: 165 mg/dL — ABNORMAL HIGH (ref 70–99)
Glucose-Capillary: 165 mg/dL — ABNORMAL HIGH (ref 70–99)
Glucose-Capillary: 181 mg/dL — ABNORMAL HIGH (ref 70–99)
Glucose-Capillary: 198 mg/dL — ABNORMAL HIGH (ref 70–99)
Glucose-Capillary: 291 mg/dL — ABNORMAL HIGH (ref 70–99)

## 2020-09-21 LAB — BASIC METABOLIC PANEL
Anion gap: 11 (ref 5–15)
BUN: 57 mg/dL — ABNORMAL HIGH (ref 8–23)
CO2: 24 mmol/L (ref 22–32)
Calcium: 8.7 mg/dL — ABNORMAL LOW (ref 8.9–10.3)
Chloride: 106 mmol/L (ref 98–111)
Creatinine, Ser: 1.88 mg/dL — ABNORMAL HIGH (ref 0.61–1.24)
GFR, Estimated: 35 mL/min — ABNORMAL LOW (ref >60)
Glucose, Bld: 240 mg/dL — ABNORMAL HIGH (ref 70–99)
Potassium: 4.1 mmol/L (ref 3.5–5.1)
Sodium: 141 mmol/L (ref 135–145)

## 2020-09-21 LAB — VITAMIN B12: Vitamin B-12: 1158 pg/mL — ABNORMAL HIGH (ref 180–914)

## 2020-09-21 IMAGING — CT CT HEAD W/O CM
4 series · 17 of 47 positions shown, 19 images · non-contrast
Comparison: [DATE]

CLINICAL DATA: Stroke follow-up

EXAM:
CT HEAD WITHOUT CONTRAST
TECHNIQUE: Contiguous axial images were obtained from the base of the skull
through the vertex without intravenous contrast.

[Series 3: head without · axial · non-contrast · 0.43mm/px · z∈[-93,+27]mm · 7 of 33 slices shown, 9 images]
[im 5/33  brain]
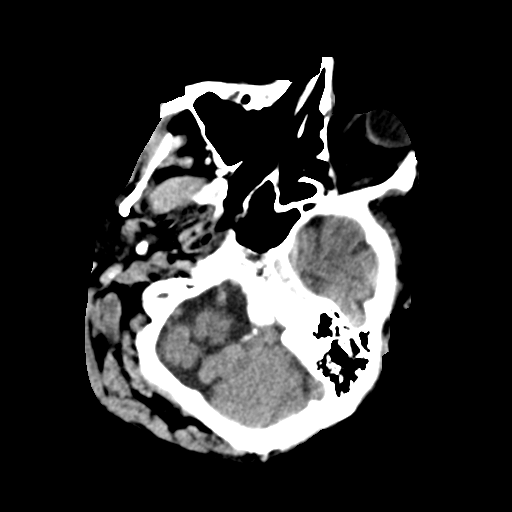
[im 5/33  bone]
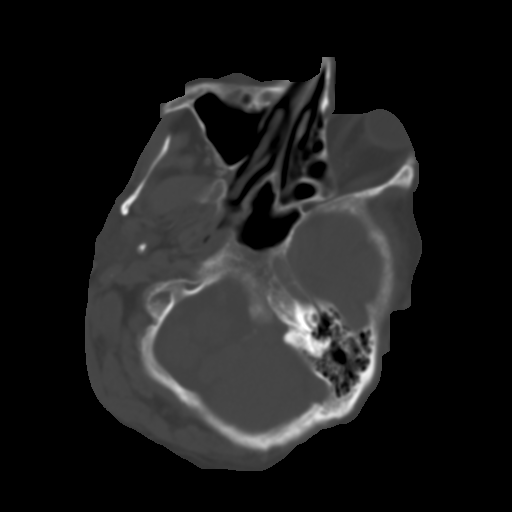
[im 9/33  brain]
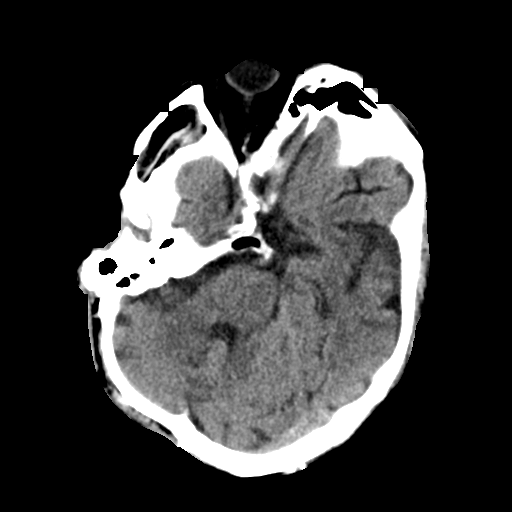
[im 13/33  brain]
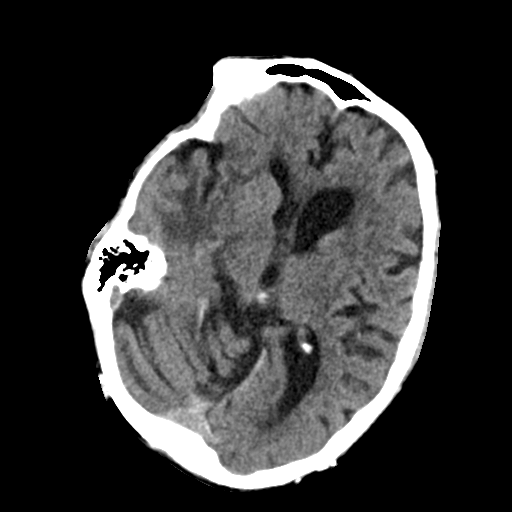
[im 17/33  brain]
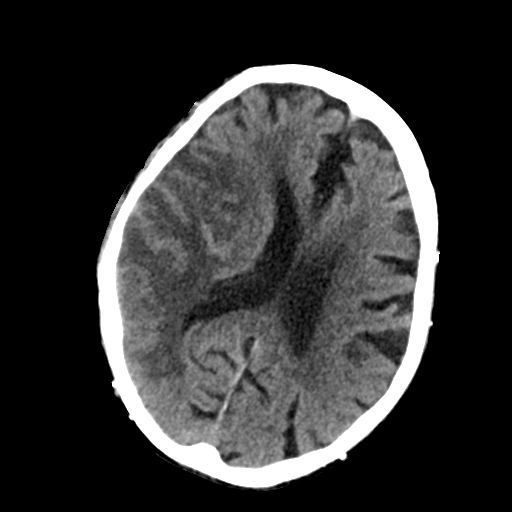
[im 21/33  brain]
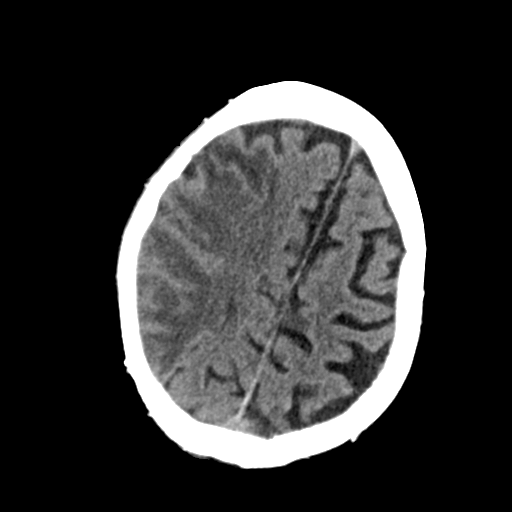
[im 21/33  bone]
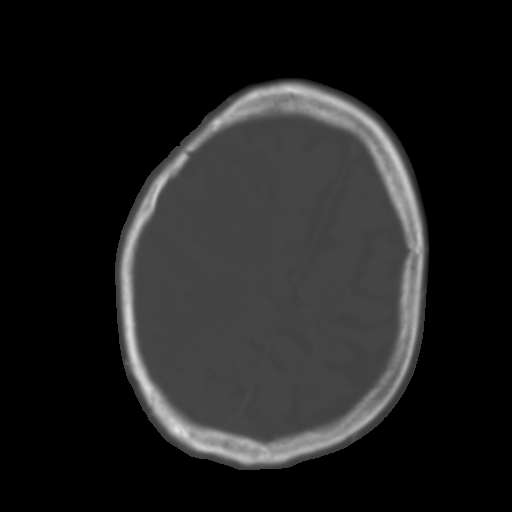
[im 25/33  brain]
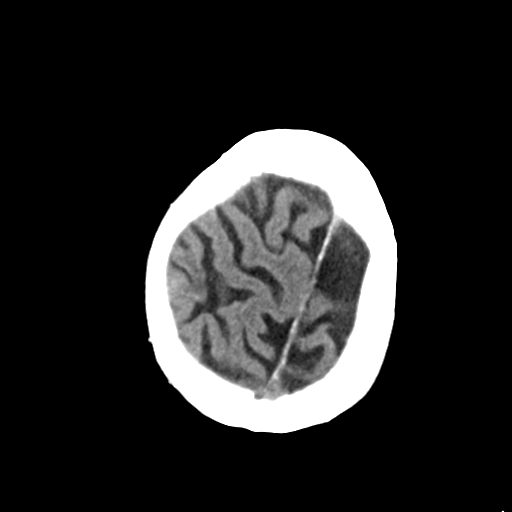
[im 29/33  brain]
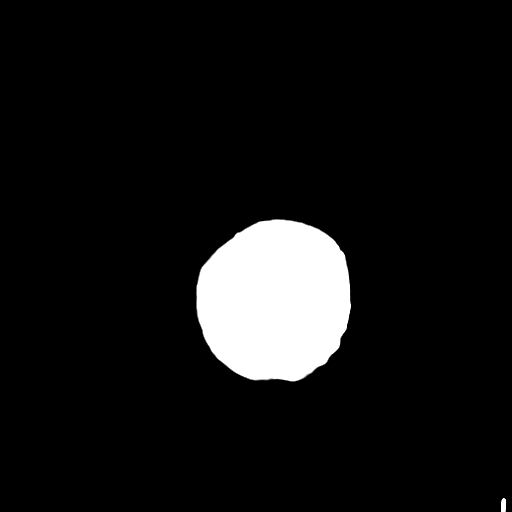

[Series 4: head bone · axial · 0.43mm/px · z∈[-97,-41]mm · 4 of 82 slices shown]
[im 9/82  bone]
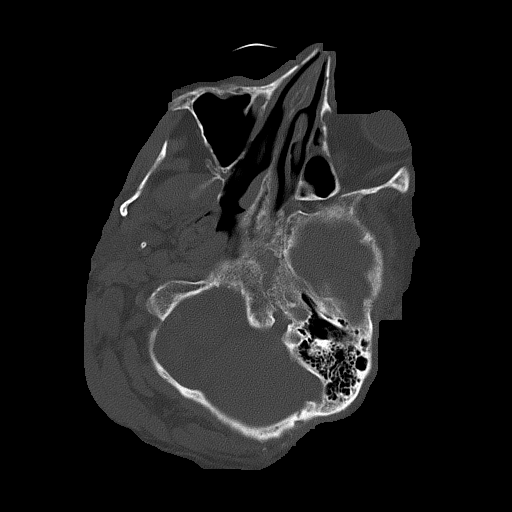
[im 17/82  bone]
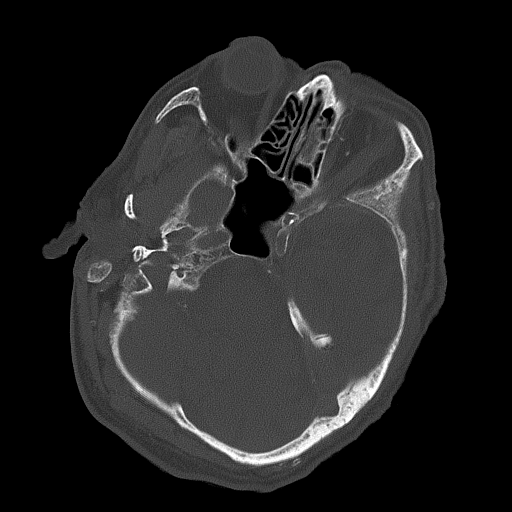
[im 25/82  bone]
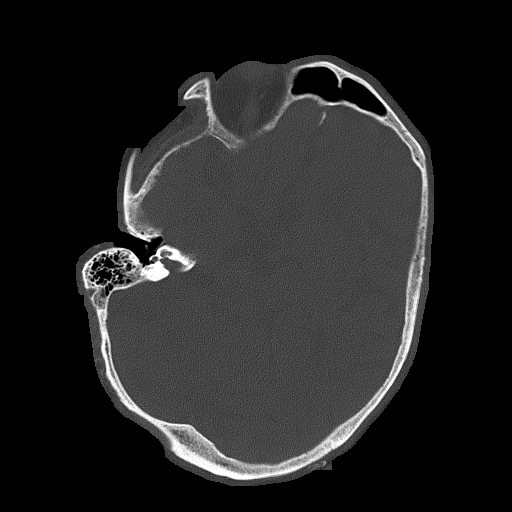
[im 37/82  bone]
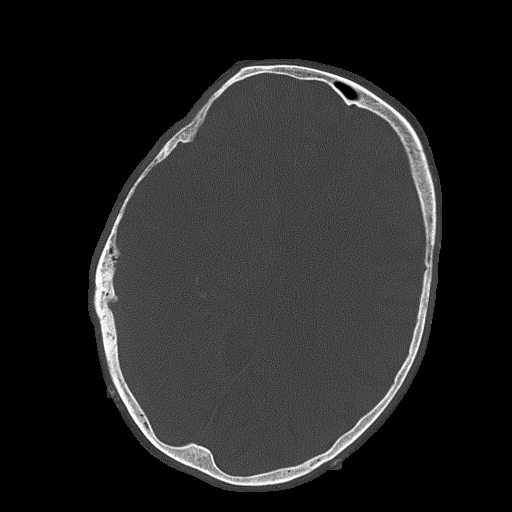

[Series 5: head without cor · coronal · non-contrast · 0.31mm/px · 3 of 67 slices shown]
[im 24/67  brain]
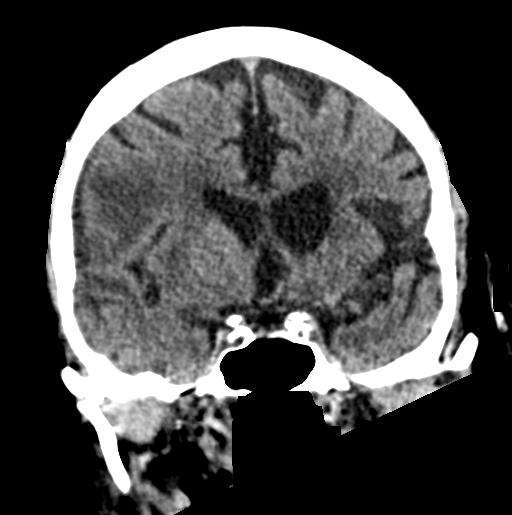
[im 30/67  brain]
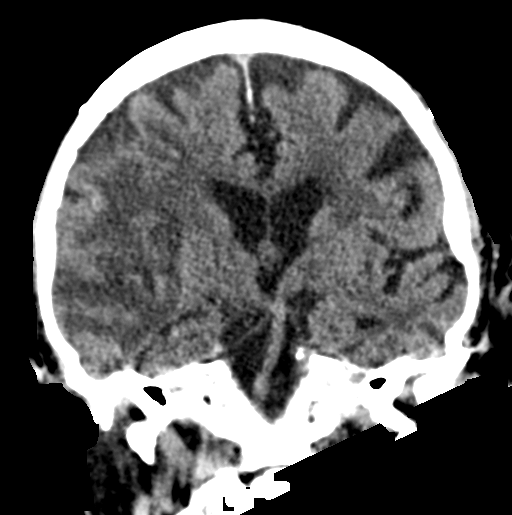
[im 37/67  brain]
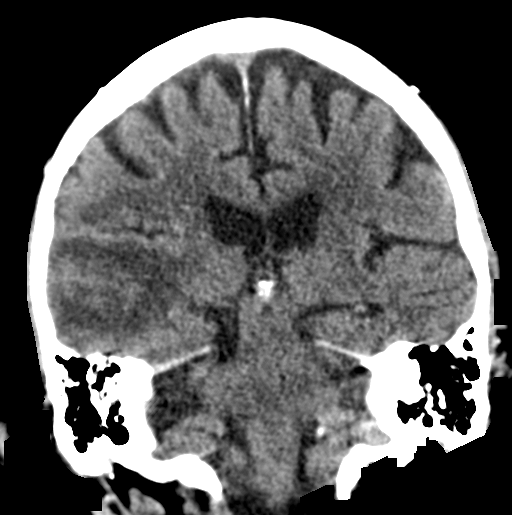

[Series 6: head without sag · sagittal · non-contrast · 0.35mm/px · 3 of 66 slices shown]
[im 29/66  brain]
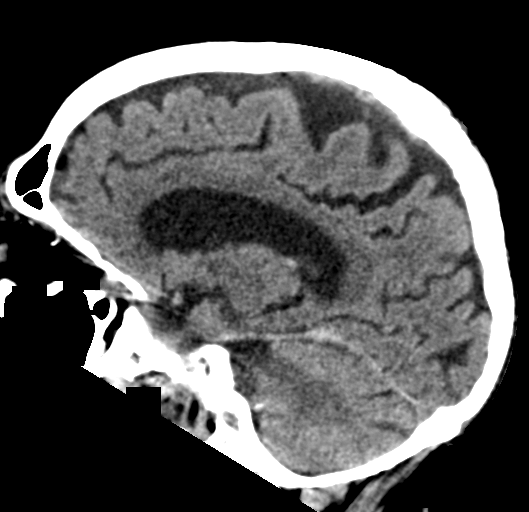
[im 34/66  brain]
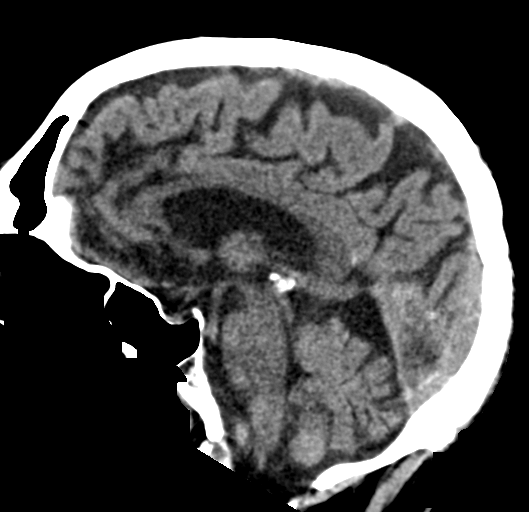
[im 39/66  brain]
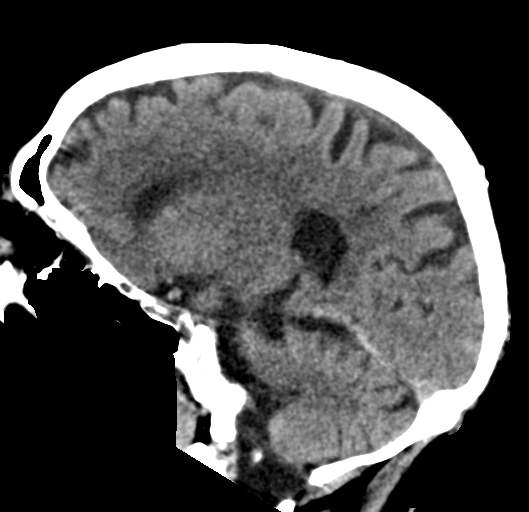

[17 of 47 positions shown; findings below may reference images not displayed]

FINDINGS: Brain: Hypodensity throughout the right MCA territory. Multifocal
mild subarachnoid hyperattenuation, likely indicating post ischemic
contrast staining. Mild mass effect on the right lateral ventricle.
3 mm of leftward midline shift.

Vascular: Atherosclerotic calcification of the vertebral and
internal carotid arteries at the skull base. No abnormal
hyperdensity of the major intracranial arteries or dural venous
sinuses.

Skull: The visualized skull base, calvarium and extracranial soft
tissues are normal.

Sinuses/Orbits: No fluid levels or advanced mucosal thickening of
the visualized paranasal sinuses. No mastoid or middle ear effusion.
The orbits are normal.
IMPRESSION: 1. Expected evolution of right MCA territory infarct with multifocal
mild subarachnoid hyperattenuation, likely indicating post ischemic
contrast staining.
2. 3 mm of leftward midline shift.

## 2020-09-21 MED ORDER — FREE WATER
150.0000 mL | Status: DC
  Administered 2020-09-21 – 2020-09-23 (×12): 150 mL

## 2020-09-21 MED ORDER — HEPARIN (PORCINE) 25000 UT/250ML-% IV SOLN
1250.0000 [IU]/h | INTRAVENOUS | Status: DC
  Administered 2020-09-21: 800 [IU]/h via INTRAVENOUS
  Administered 2020-09-22: 1150 [IU]/h via INTRAVENOUS
  Filled 2020-09-21 (×4): qty 250

## 2020-09-21 MED ORDER — INSULIN ASPART 100 UNIT/ML IJ SOLN
6.0000 [IU] | INTRAMUSCULAR | Status: DC
  Administered 2020-09-21 – 2020-09-23 (×11): 6 [IU] via SUBCUTANEOUS

## 2020-09-21 NOTE — Progress Notes (Addendum)
ANTICOAGULATION CONSULT NOTE - Follow Up Consult  Pharmacy Consult for Heparin Indication: Afib / CVA  Allergies  Allergen Reactions   Baclofen Other (See Comments)    lethargy    Patient Measurements: Height: 5\' 7"  (170.2 cm) Weight: 68.1 kg (150 lb 2.1 oz) IBW/kg (Calculated) : 66.1   Vital Signs: Temp: 98 F (36.7 C) (08/02 1217) Temp Source: Oral (08/02 1217) BP: 119/75 (08/02 1217) Pulse Rate: 100 (08/02 1217)  Labs: Recent Labs    09/19/20 1642 09/20/20 0244 09/21/20 0345  CREATININE 1.90* 1.85* 1.88*    Estimated Creatinine Clearance: 27.3 mL/min (A) (by C-G formula based on SCr of 1.88 mg/dL (H)).  Assessment: 87 year old male admitted with CVA 7/24, now starting heparin for Afib / CVA prevention.  History of GI bleed on warfarin in September of 2021.  Planning long-term Eliquis eventually.  Goal of Therapy:  Heparin level 0.3-0.5 units/ml Monitor platelets by anticoagulation protocol: Yes   Plan:  Heparin at 800 units / hr Heparin level and CBC daily  Thank you 11-22-1995, PharmD  09/21/2020,3:28 PM

## 2020-09-21 NOTE — Progress Notes (Signed)
  Speech Language Pathology Treatment: Dysphagia  Patient Details Name: Andre Adams MRN: [id number] DOB: [birth year] Today's Date: 09/21/2020 Time: 0174-9449 SLP Time Calculation (min) (ACUTE ONLY): 14 min  Assessment / Plan / Recommendation Clinical Impression  Pt has audible congestion at baseline and his volitional cough sounds very weak. Today it is not able to clear his wet vocal quality. There are secretions spilling out of the L side of his mouth and thick secretions were removed from his oral cavity during oral care as well. He overall appears to be having difficulty managing his secretions well. Attempted to provide him with small amounts of ice and water, but with his reduced labial seal he either does not get the bolus into his mouth or he loses it entirely before he can swallow any of it. SLP tried to facilitate positioning, cueing and providing physical support to hold his head more upright, but without any overt improvement in function. Would remain NPO with alternative means of nutrition.   HPI HPI: Pt is an 87 y.o. male who presented from an ALF on 09/11/20 s/p fall with L-sided weakness. NIHSS = 20. No tPA administered 2/2 fall with head trauma. 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 ETT 7/23-7/29. PMH: DM2, CKD stage 3a, HTN, HL, hx a fib off AC 2/2 hx GIB      SLP Plan  Continue with current plan of care       Recommendations  Diet recommendations: NPO Medication Administration: Via alternative means                Oral Care Recommendations: Oral care QID Follow up Recommendations: Skilled Nursing facility SLP Visit Diagnosis: Dysphagia, unspecified (R13.10) Plan: Continue with current plan of care       GO                Mahala Menghini., M.A. CCC-SLP Acute Rehabilitation Services Pager [phone] Office [phone]  09/21/2020, 12:45 PM

## 2020-09-21 NOTE — Progress Notes (Signed)
Patient ID: Andre Adams, male   DOB: 05-Aug-1933, 87 y.o.   MRN: [id number] Patient ID: Andre Adams, male   DOB: 1933/09/16, 87 y.o.   MRN: [id number] STROKE TEAM PROGRESS NOTE   INTERVAL HISTORY Patient is lying in bed getting cleaned up.Marland Kitchen  He is arousable and follows simple commands.  Speech is dysarthric but can be understood with some difficulty.  Vital signs are stable.  BMP from today shows stable creatinine of 1.88 and elevated glucose of 240  OBJECTIVE Vitals:   09/21/20 0408 09/21/20 0500 09/21/20 0725 09/21/20 1217  BP: 124/76  (!) 141/82 119/75  Pulse: 82  99 100  Resp: 19  18 20   Temp: 98.3 F (36.8 C)  98.7 F (37.1 C) 98 F (36.7 C)  TempSrc: Oral  Oral Oral  SpO2: 99%  100% 97%  Weight:  68.1 kg    Height:        CBC:  Recent Labs  Lab 09/16/20 0750 09/17/20 0200  WBC 10.1 10.9*  HGB 12.5* 12.3*  HCT 38.9* 37.8*  MCV 101.3* 100.3*  PLT 154 161    Basic Metabolic Panel:  Recent Labs  Lab 09/16/20 0750 09/17/20 0200 09/18/20 0351 09/20/20 0244 09/21/20 0345  NA 141 140   < > 143 141  K 4.3 4.0   < > 4.0 4.1  CL 111 108   < > 111 106  CO2 24 22   < > 24 24  GLUCOSE 270* 223*   < > 196* 240*  BUN 46* 51*   < > 58* 57*  CREATININE 2.07* 2.10*   < > 1.85* 1.88*  CALCIUM 8.5* 8.5*   < > 8.7* 8.7*  MG 2.2  --   --   --   --   PHOS 3.3 3.0  --   --   --    < > = values in this interval not displayed.    Lipid Panel:     Component Value Date/Time   CHOL 147 09/12/2020 0417   TRIG 94 09/16/2020 0750   HDL 27 (L) 09/12/2020 0417   CHOLHDL 5.4 09/12/2020 0417   VLDL 20 09/12/2020 0417   LDLCALC 100 (H) 09/12/2020 0417   HgbA1c:  Lab Results  Component Value Date   HGBA1C 7.8 (H) 09/12/2020   Urine Drug Screen: No results found for: LABOPIA, COCAINSCRNUR, LABBENZ, AMPHETMU, THCU, LABBARB  Alcohol Level     Component Value Date/Time   ETH <10 09/11/2020 1845    IMAGING  CT Head Wo Contrast  Result Date: 09/21/2020 CLINICAL DATA:   Stroke follow-up EXAM: CT HEAD WITHOUT CONTRAST TECHNIQUE: Contiguous axial images were obtained from the base of the skull through the vertex without intravenous contrast. COMPARISON:  09/12/2020 FINDINGS: Brain: Hypodensity throughout the right MCA territory. Multifocal mild subarachnoid hyperattenuation, likely indicating post ischemic contrast staining. Mild mass effect on the right lateral ventricle. 3 mm of leftward midline shift. Vascular: Atherosclerotic calcification of the vertebral and internal carotid arteries at the skull base. No abnormal hyperdensity of the major intracranial arteries or dural venous sinuses. Skull: The visualized skull base, calvarium and extracranial soft tissues are normal. Sinuses/Orbits: No fluid levels or advanced mucosal thickening of the visualized paranasal sinuses. No mastoid or middle ear effusion. The orbits are normal. IMPRESSION: 1. Expected evolution of right MCA territory infarct with multifocal mild subarachnoid hyperattenuation, likely indicating post ischemic contrast staining. 2. 3 mm of leftward midline shift. Electronically Signed   By: 09/14/2020  Chase Picket M.D.   On: 09/21/2020 03:43     DG Wrist Complete Left  Result Date: 09/11/2020 CLINICAL DATA:  fall EXAM: LEFT HAND - COMPLETE 3+ VIEW; LEFT WRIST - COMPLETE 3+ VIEW COMPARISON:  December 15, 2019 common March 13, 2020 FINDINGS: Revisualization of the sequela of an impacted fracture of the distal LEFT radius and ulna. There is mature osseous bridging with scattered residual areas of lucency from prior fracture sites. Fracture fragments are in unchanged alignment. No definitive acute fracture. Vascular calcifications. Osteopenia. Degenerative changes throughout the DIPs and PIPs. IMPRESSION: Revisualization of sequela of prior impacted fracture of the distal radius and ulna. Evaluation for superimposed acute fracture is limited due to osteopenia and underlying chronic osseous remodeling. No definitive  superimposed acute fracture is noted. If persistent clinical concern for scaphoid fracture, recommend immobilization and follow-up radiographs in 2 weeks versus MRI. Electronically Signed   By: Meda Klinefelter MD   On: 09/11/2020 19:17   CT HEAD WO CONTRAST  Result Date: 09/12/2020 CLINICAL DATA:  Status post thrombectomy EXAM: CT HEAD WITHOUT CONTRAST TECHNIQUE: Contiguous axial images were obtained from the base of the skull through the vertex without intravenous contrast. COMPARISON:  None. FINDINGS: Brain: There is hyperdensity over the right MCA territory, likely contrast staining. There is generalized atrophy without lobar predilection. There is periventricular hypoattenuation compatible with chronic microvascular disease. Vascular: No abnormal hyperdensity of the major intracranial arteries or dural venous sinuses. No intracranial atherosclerosis. Skull: The visualized skull base, calvarium and extracranial soft tissues are normal. Sinuses/Orbits: No fluid levels or advanced mucosal thickening of the visualized paranasal sinuses. No mastoid or middle ear effusion. The orbits are normal. IMPRESSION: Hyperdensity over the right MCA territory, likely contrast staining. Electronically Signed   By: Deatra Robinson M.D.   On: 09/12/2020 02:55   CT Cervical Spine Wo Contrast  Result Date: 09/11/2020 CLINICAL DATA:  Facial trauma. EXAM: CT CERVICAL SPINE WITHOUT CONTRAST TECHNIQUE: Multidetector CT imaging of the cervical spine was performed without intravenous contrast. Multiplanar CT image reconstructions were also generated. COMPARISON:  None. FINDINGS: Alignment: No evidence of acute traumatic subluxation. Skull base and vertebrae: No acute cervical spine fracture or suspicious osseous lesion. Mild superior endplate compression fracture at T3, likely chronic. Soft tissues and spinal canal: No prevertebral fluid or swelling. No visible canal hematoma. Disc levels: Moderate to severe disc space narrowing  from C4-5 to C6-7. Severe bilateral facet arthrosis at C3-4 with small subchondral cysts or erosions and joint widening on the right. Right facet ankylosis at C4-5. No evidence of high-grade spinal stenosis or high-grade neural foraminal stenosis. Upper chest: No apical lung consolidation or mass. Other: Subcentimeter thyroid nodules for which no imaging follow-up is recommended. Carotid atherosclerosis. IMPRESSION: 1. No acute cervical spine fracture. 2. Advanced cervical disc and facet degeneration. Electronically Signed   By: Sebastian Ache M.D.   On: 09/11/2020 19:01   DG Pelvis Portable  Result Date: 09/11/2020 CLINICAL DATA:  trauma EXAM: PORTABLE PELVIS 1-2 VIEWS COMPARISON:  December 15, 2019 FINDINGS: Osteopenia. Excreted contrast limits evaluation of the sacrum. No acute displaced fracture seen on single view. No pelvic diastasis. Vascular calcifications. Degenerative changes of the lumbar spine. IMPRESSION: No acute fracture on single view. If persistent concern for nondisplaced hip or pelvic fracture, recommend dedicated pelvic MRI. Electronically Signed   By: Meda Klinefelter MD   On: 09/11/2020 19:13   Portable Chest x-ray  Result Date: 09/11/2020 CLINICAL DATA:  87 year old male status post intubation. EXAM: PORTABLE CHEST  1 VIEW COMPARISON:  Earlier radiograph dated 09/11/2020. FINDINGS: Interval placement of an endotracheal tube with tip approximately 6 cm above the carina. Enteric tube with side-port in the distal esophagus and tip close to the GE junction. Recommend further advancing of the enteric tube by at least additional 10 cm. Diffuse interstitial coarsening and left lung base atelectasis similar to prior radiograph. Stable cardiomediastinal silhouette. Atherosclerotic calcification of the aorta. No acute osseous pathology. IMPRESSION: 1. Endotracheal tube above the carina. 2. Enteric tube with side-port in the distal esophagus and tip close to the GE junction. Recommend further  advancing of the enteric tube by at least 10 cm. Electronically Signed   By: Elgie Collard M.D.   On: 09/11/2020 23:08   DG Chest Portable 1 View  Result Date: 09/11/2020 CLINICAL DATA:  trauma EXAM: PORTABLE CHEST 1 VIEW COMPARISON:  October 23, 2019, October 03, 2019 FINDINGS: Evaluation is limited secondary to patient rotation. The cardiomediastinal silhouette is grossly unchanged in contour. No pleural effusion. No pneumothorax. Diffuse interstitial prominence. Visualized abdomen is unremarkable. Compression fracture deformity of the T11, unchanged in comparison to prior. Compression fracture of T9 appears similar comparison to prior. IMPRESSION: Diffuse interstitial prominence likely reflecting underlying pulmonary edema. Differential considerations include atypical infection. Electronically Signed   By: Meda Klinefelter MD   On: 09/11/2020 19:12   DG Hand Complete Left  Result Date: 09/11/2020 CLINICAL DATA:  fall EXAM: LEFT HAND - COMPLETE 3+ VIEW; LEFT WRIST - COMPLETE 3+ VIEW COMPARISON:  December 15, 2019 common March 13, 2020 FINDINGS: Revisualization of the sequela of an impacted fracture of the distal LEFT radius and ulna. There is mature osseous bridging with scattered residual areas of lucency from prior fracture sites. Fracture fragments are in unchanged alignment. No definitive acute fracture. Vascular calcifications. Osteopenia. Degenerative changes throughout the DIPs and PIPs. IMPRESSION: Revisualization of sequela of prior impacted fracture of the distal radius and ulna. Evaluation for superimposed acute fracture is limited due to osteopenia and underlying chronic osseous remodeling. No definitive superimposed acute fracture is noted. If persistent clinical concern for scaphoid fracture, recommend immobilization and follow-up radiographs in 2 weeks versus MRI. Electronically Signed   By: Meda Klinefelter MD   On: 09/11/2020 19:17   CT HEAD CODE STROKE WO CONTRAST  Result  Date: 09/11/2020 CLINICAL DATA:  Code stroke. Neuro deficit, acute, stroke suspected. EXAM: CT HEAD WITHOUT CONTRAST TECHNIQUE: Contiguous axial images were obtained from the base of the skull through the vertex without intravenous contrast. COMPARISON:  09/27/2019 FINDINGS: Brain: There is no evidence of an acute infarct, intracranial hemorrhage, mass, midline shift, or extra-axial fluid collection. There is mild-to-moderate cerebral atrophy. Patchy hypodensities in the cerebral white matter bilaterally are similar to the prior motion degraded CT and are nonspecific but compatible with moderate chronic small vessel ischemic disease. A chronic infarct is again noted in the left basal ganglia. There is also a small chronic right cerebellar infarct. Vascular: Calcified atherosclerosis at the skull base. No hyperdense vessel. Skull: No fracture or suspicious osseous lesion. Sinuses/Orbits: Paranasal sinuses and mastoid air cells are clear. Bilateral cataract extraction. Other: None. ASPECTS [facility] Stroke Program Early CT Score) - Ganglionic level infarction (caudate, lentiform nuclei, internal capsule, insula, M1-M3 cortex): 7 - Supraganglionic infarction (M4-M6 cortex): 3 Total score (0-10 with 10 being normal): 10 IMPRESSION: 1. No evidence of acute intracranial abnormality. 2. ASPECTS is 10. 3. Moderate chronic small vessel ischemic disease. These results were communicated to Dr. Selina Cooley at 6:21 pm on 09/11/2020  by text page via the [facility] messaging system. Electronically Signed   By: Sebastian Ache M.D.   On: 09/11/2020 18:23   CT ANGIO HEAD NECK W WO CM W PERF (CODE STROKE)  Result Date: 09/11/2020 CLINICAL DATA:  Neuro deficit, acute, stroke suspected. EXAM: CT ANGIOGRAPHY HEAD AND NECK CT PERFUSION BRAIN TECHNIQUE: Multidetector CT imaging of the head and neck was performed using the standard protocol during bolus administration of intravenous contrast. Multiplanar CT image reconstructions and MIPs were obtained  to evaluate the vascular anatomy. Carotid stenosis measurements (when applicable) are obtained utilizing NASCET criteria, using the distal internal carotid diameter as the denominator. Multiphase CT imaging of the brain was performed following IV bolus contrast injection. Subsequent parametric perfusion maps were calculated using RAPID software. CONTRAST:  58mL OMNIPAQUE IOHEXOL 350 MG/ML SOLN COMPARISON:  None. FINDINGS: CTA NECK FINDINGS Aortic arch: Normal variant aortic arch branching pattern with common origin of the brachiocephalic and left common carotid arteries. Mild atherosclerotic plaque without arch vessel origin stenosis. Right carotid system: Patent with predominantly calcified plaque at the carotid bifurcation. No evidence of a significant stenosis or dissection. Left carotid system: Patent with mixed calcified and soft plaque at the carotid bifurcation. No evidence of a significant stenosis or dissection. Vertebral arteries: Patent with the left being mildly dominant. Scattered atherosclerosis bilaterally resulting in severe proximal V1 and mild V3 stenoses on the left. Skeleton: See separate cervical spine CT report. Other neck: Subcentimeter thyroid nodules for which no imaging follow-up is recommended. Upper chest: Mild motion artifact. No apical lung consolidation or mass. Review of the MIP images confirms the above findings CTA HEAD FINDINGS Anterior circulation: The internal carotid arteries are patent from skull base to carotid termini with atherosclerotic plaque resulting in moderate bilateral paraclinoid stenoses. The right M1 segment is patent, however there is a proximal M2 occlusion without significant distal reconstitution. The left MCA is patent with a mild M1 stenosis noted. The ACAs are patent with mild-to-moderate stenosis of the left A1 segment as well as moderate irregular narrowing of both A2 segments. No aneurysm is identified. Posterior circulation: The intracranial vertebral  arteries are patent to the basilar with atherosclerosis resulting in irregularity bilaterally and a moderate stenosis on the right. The basilar artery is patent with a severe stenosis in its midportion. The right PCA is patent with a severe stenosis near the P1-P2 junction. There are severe stenoses of both PCAs near the P1-P2 junctions with poor flow distally, particularly on the left. No aneurysm is identified. Venous sinuses: As permitted by contrast timing, patent. Anatomic variants: None. Review of the MIP images confirms the above findings CT Brain Perfusion Findings: ASPECTS: 10 CBF (<30%) Volume: 40 mL Perfusion (Tmax>6.0s) volume: 184 mL Mismatch Volume: 144 mL Infarction Location: Right frontal and temporal lobes primarily at the level of the operculum (MCA territory) IMPRESSION: 1. Proximal right M2 occlusion. 2. CTP demonstrates a right MCA core infarct with large penumbra as detailed above. 3. Advanced intracranial atherosclerosis including moderate bilateral ICA stenoses, a severe basilar artery stenosis, and severe bilateral proximal PCA stenoses. 4. Cervical carotid atherosclerosis without significant stenosis. 5. Severe proximal left vertebral artery stenosis. Emergent findings were communicated to Dr. Selina Cooley at 6:35 pm on 09/11/2020 by text page via the [facility] messaging system. Electronically Signed   By: Sebastian Ache M.D.   On: 09/11/2020 18:51   CT Maxillofacial Wo Contrast  Result Date: 09/11/2020 CLINICAL DATA:  Facial trauma. EXAM: CT MAXILLOFACIAL WITHOUT CONTRAST TECHNIQUE: Multidetector CT imaging  of the maxillofacial structures was performed. Multiplanar CT image reconstructions were also generated. COMPARISON:  Head CT 09/27/2019 FINDINGS: Osseous: No acute fracture, mandibular dislocation, or destructive osseous process. Orbits: Bilateral cataract extraction and proptosis. No orbital hematoma or mass. Sinuses: Paranasal sinuses and mastoid air cells are clear. Mild rightward nasal  septal deviation. Soft tissues: Punctate left parotid calcification.  Atherosclerosis. Limited intracranial: More fully evaluated on separate head CT. IMPRESSION: No acute maxillofacial fracture. Electronically Signed   By: Sebastian Ache M.D.   On: 09/11/2020 18:55    ECG - atrial fibrillation - ventricular response 94 BPM (See cardiology reading for complete details)  PHYSICAL EXAM  Temp:  [98 F (36.7 C)-100.4 F (38 C)] 98 F (36.7 C) (08/02 1217) Pulse Rate:  [82-107] 100 (08/02 1217) Resp:  [18-20] 20 (08/02 1217) BP: (119-141)/(75-89) 119/75 (08/02 1217) SpO2:  [97 %-100 %] 97 % (08/02 1217) Weight:  [68.1 kg] 68.1 kg (08/02 0500)  General -elderly Caucasian male appears chronically ill. Pulmonary: tolerating room air.   Neuro -patient is drowsy but can be aroused with some difficulty.  Mumbles a few words but is dysarthric and difficult to understand PERRL. Left eye proptosis noted. Corneal present, gag and cough present.  . Attempts to answer questions but not intelligible. Not able to assess orientation though likely not oriented. Will track examiner.  Detailed motor exam difficult. Moving all ext spontaneously. In restraints Sensation, coordination and gait not tested due to mentation.   ASSESSMENT/PLAN Mr. DEAKON FRIX is a 87 y.o. male with history of DM2, CKD stage 3a, HTN, HL, hx a fib off AC 2/2 hx GIB, mild cognitive impairment and frequent falls who presented after a fall, non verbal and L sided weakness at ALF. He did not receive IV t-PA due to head trauma with fall. The pt was taken to IR for intervention for thrombectomy Rt M2 lesion.  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 CT Head - No evidence of acute intracranial abnormality.  CTA H&N - Proximal right M2 occlusion. Advanced intracranial atherosclerosis including moderate bilateral ICA stenoses, a severe basilar  artery stenosis, and severe bilateral proximal PCA stenoses.  CTP 40/144 cc, positive for penumbra MRI brain - right MCA large infarct with petechial hemorrhage MRA head - patent right M2 2D Echo EF 65-70% LDL - 100 HgbA1c 7.8 VTE prophylaxis -Lovenox No antithrombotics prior to admission, now on aspirin 325. Will consider eliquis early next week (10 days post stroke) Ongoing aggressive stroke risk factor management Therapy recommendations:  SNF Disposition:  Pending  Chronic Afib Was on Coumadin in the past Coumadin discontinued in 10/2019 due to GI bleeding Follow-up with cardiology, wife and pt declined watchman device  Now on ASA Will consider AC with eliquis in 7 to 10 days post stroke (early next week)  Hx of GIB Per discharge in 10/2019 "Of note, patient had recent GI bleed last month with no source of bleeding found on colonoscopy although did have a polypack to me for multiple submucosal polyps. Was plan for further polyp removal outpatient but has not done so yet. Patient had episode of large-volume hematochezia on 10/26/2019. GI was consulted and originally reccommended observation. Serial CBCs were obtained with downtrending Hgbs, GI performed colonoscopy on 10/29/19 which revealed large fecal impaction with underlying deep ulcers in the rectum (nonbleeding) consistent with stercoral ulcers. This was thought to be the cause of the bleeding.  Large borders solid stool was removed in combination with digital disimpaction, irrigation, and snare and rat-tooth forceps. Patient received Fleet enema x1 and increase MiraLAX daily 34 mg with continuing daily fiber supplementation. Important to maintain a daily aggressive bowel regimen ondischarge. Do not feel above GI findings are [facility], especially now pt had stroke [facility] Will consider eliquis in 7-10 days post stroke (early next week) Continue ASA for now  Respiratory failure Extubated yesterday. Tolerating room  air. Transfer to progressive unit. Family confirmed DNR status again today.  Seizure-like activity EEG neg, keppra stopped.  Hypertension Stable at the low end Off IVF On metoprolol and doxazosin Long-term BP goal normotensive  Hyperlipidemia Home Lipid lowering medication: unknown LDL 100, goal < 70 Current lipid lowering medication: lipitor 40 Continue statin at discharge  Uncontrolled Diabetes Home diabetic meds: unknown Uncontrolled  Hyperglycemia  On lantus and novolog 2U q4h -> 5U q4h SSI  CBG monitoring HgbA1c - 7.8, goal < 7.0 Diabetes coordinator on board PCP close follow up   Other Stroke Risk Factors Advanced age  Other Active Problems Code status - Full code CKD - stage 3b - Cre - 2.5->2.27->2.17->2.07->2.07->2.10->1.90->1.85 Chronic fractures related to fall - hip, pelvic and LEFT radius and ulna fxs  Mild leukocytosis - WBCs - 11.4 ->10.1->10.4->10.1->10.9 (temp 99.9) Frequent falls (2 last year, 1 this year) Elevated MCV - check B12 and folate levels  Patient  .  Continues to have dysphagia and hence we will continue core track tube feeding.  Increase NovoLog insulin dose and free water to improve renal function and blood glucose control.  Plan repeat CT head today and likely start IV heparin drip after that.  Continue   speech therapy to follow him may need to consider PEG tube if swallow function does not improve soon.  Long discussion   with the patient's wife at the bedside and answered questions.  Greater than 50% time during this 25-minute visit was spent on counseling and coordination of care and discussion with care team and family and answering questions  Delia HeadyPramod Marriah Sanderlin, MD Hospital day # 10

## 2020-09-22 ENCOUNTER — Inpatient Hospital Stay (HOSPITAL_COMMUNITY): Payer: HMO

## 2020-09-22 DIAGNOSIS — I63511 Cerebral infarction due to unspecified occlusion or stenosis of right middle cerebral artery: Secondary | ICD-10-CM | POA: Diagnosis not present

## 2020-09-22 LAB — CBC
HCT: 38.8 % — ABNORMAL LOW (ref 39.0–52.0)
Hemoglobin: 12.9 g/dL — ABNORMAL LOW (ref 13.0–17.0)
MCH: 32.7 pg (ref 26.0–34.0)
MCHC: 33.2 g/dL (ref 30.0–36.0)
MCV: 98.5 fL (ref 80.0–100.0)
Platelets: 246 10*3/uL (ref 150–400)
RBC: 3.94 MIL/uL — ABNORMAL LOW (ref 4.22–5.81)
RDW: 12.5 % (ref 11.5–15.5)
WBC: 10.9 10*3/uL — ABNORMAL HIGH (ref 4.0–10.5)
nRBC: 0 % (ref 0.0–0.2)

## 2020-09-22 LAB — GLUCOSE, CAPILLARY
Glucose-Capillary: 145 mg/dL — ABNORMAL HIGH (ref 70–99)
Glucose-Capillary: 168 mg/dL — ABNORMAL HIGH (ref 70–99)
Glucose-Capillary: 188 mg/dL — ABNORMAL HIGH (ref 70–99)
Glucose-Capillary: 211 mg/dL — ABNORMAL HIGH (ref 70–99)
Glucose-Capillary: 214 mg/dL — ABNORMAL HIGH (ref 70–99)
Glucose-Capillary: 245 mg/dL — ABNORMAL HIGH (ref 70–99)

## 2020-09-22 LAB — HEPARIN LEVEL (UNFRACTIONATED)
Heparin Unfractionated: 0.12 IU/mL — ABNORMAL LOW (ref 0.30–0.70)
Heparin Unfractionated: 0.16 IU/mL — ABNORMAL LOW (ref 0.30–0.70)

## 2020-09-22 IMAGING — DX DG CHEST 1V PORT
1 series · 1 of 1 positions shown · non-contrast
Comparison: [DATE]

CLINICAL DATA: Congestion wheezing

EXAM:
PORTABLE CHEST 1 VIEW

[chest]
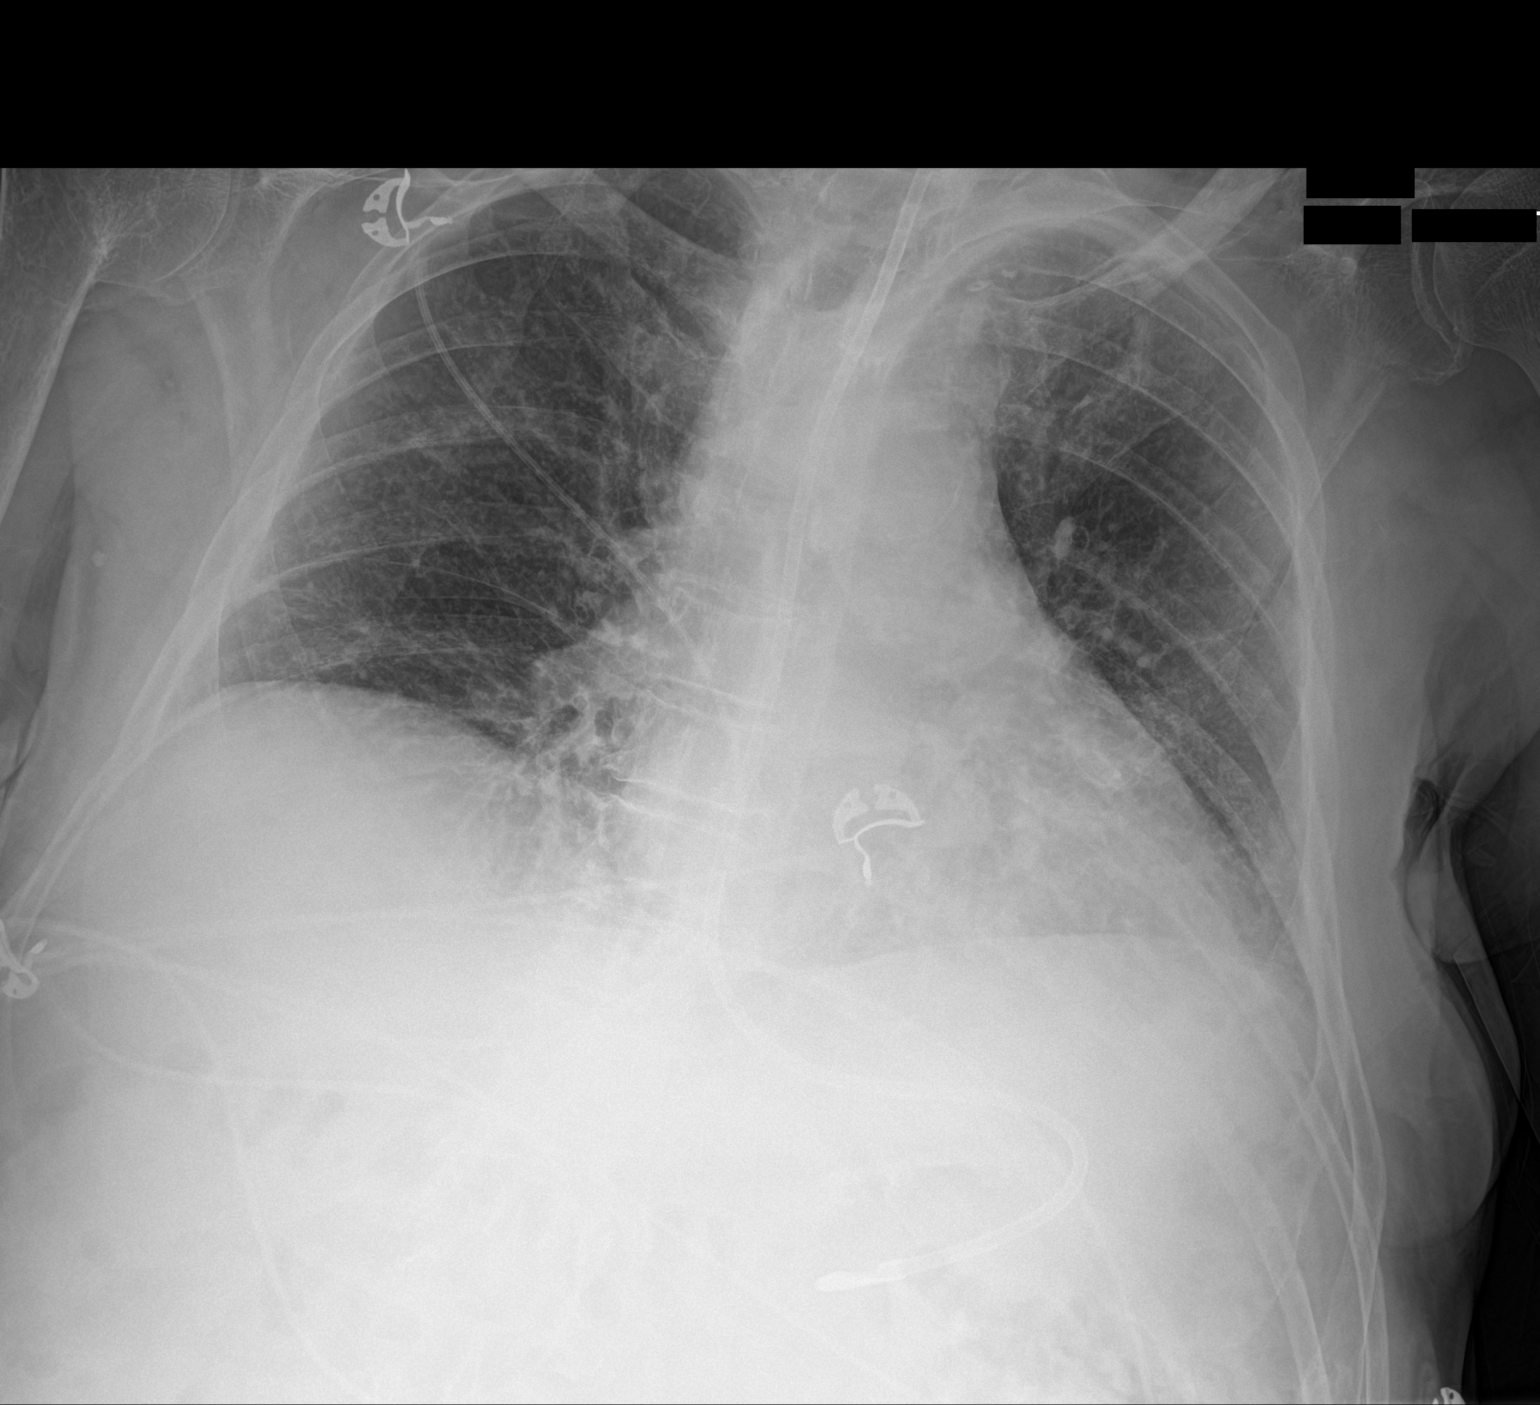

[1 of 1 positions shown; findings below may reference images not displayed]

FINDINGS: Esophageal tube tip overlies the stomach. Low lung volumes.
Borderline cardiomegaly with aortic atherosclerosis. Coarse chronic
interstitial opacity without acute airspace disease, pleural
effusion or pneumothorax.
IMPRESSION: No significant change since [DATE]. Low lung volumes with coarse
chronic interstitial opacities.

## 2020-09-22 MED ORDER — ALBUTEROL SULFATE (2.5 MG/3ML) 0.083% IN NEBU: 2.5000 mg | INHALATION_SOLUTION | RESPIRATORY_TRACT | Status: DC | PRN

## 2020-09-22 MED ORDER — ALBUTEROL SULFATE (2.5 MG/3ML) 0.083% IN NEBU
2.5000 mg | INHALATION_SOLUTION | RESPIRATORY_TRACT | Status: DC
  Administered 2020-09-22: 2.5 mg via RESPIRATORY_TRACT
  Filled 2020-09-22: qty 3

## 2020-09-22 NOTE — Progress Notes (Signed)
ANTICOAGULATION CONSULT NOTE - Follow Up Consult  Pharmacy Consult for Heparin Indication: Afib / CVA  Allergies  Allergen Reactions   Baclofen Other (See Comments)    lethargy    Patient Measurements: Height: 5\' 7"  (170.2 cm) Weight: 68.1 kg (150 lb 2.1 oz) IBW/kg (Calculated) : 66.1   Vital Signs: Temp: 98 F (36.7 C) (08/03 1130) Temp Source: Oral (08/03 0334) BP: 148/93 (08/03 1130) Pulse Rate: 108 (08/03 1130)  Labs: Recent Labs    09/19/20 1642 09/20/20 0244 09/21/20 0345 09/22/20 0352 09/22/20 1215  HGB  --   --   --  12.9*  --   HCT  --   --   --  38.8*  --   PLT  --   --   --  246  --   HEPARINUNFRC  --   --   --  0.12* 0.16*  CREATININE 1.90* 1.85* 1.88*  --   --      Estimated Creatinine Clearance: 27.3 mL/min (A) (by C-G formula based on SCr of 1.88 mg/dL (H)).  Assessment: 87 year old male admitted with CVA 7/24, now starting heparin for Afib / CVA prevention.  History of GI bleed on warfarin in September of 2021.  Planning long-term Eliquis eventually.  Heparin came back subtherapeutic again today at 0.16. We will increase the rate and check another level.  Goal of Therapy:  Heparin level 0.3-0.5 units/ml Monitor platelets by anticoagulation protocol: Yes   Plan:  Increase heparin to 1150 units / hr Check 8 hr hL Heparin level and CBC daily  11-22-1995, PharmD, [place], AAHIVP, CPP Infectious Disease Pharmacist 09/22/2020 2:12 PM

## 2020-09-22 NOTE — Progress Notes (Addendum)
STROKE TEAM PROGRESS NOTE   INTERVAL HISTORY Patient sitting up in bed attempting to cough up his secretions. Per nursing they have not been able to suction up secretions with yaunker suctioning.   Pulmonary toilet initiated with albuterol neb treatments Q4, OOB to chair and vest percussion, CXR ordered to evaluate for aspiration  Patient is now on IV heparin and level is slightly suboptimal today.  Blood glucose have improved.  Pertinent Imaging and Lab work   CT HEAD WO CONTRAST 09/12/2020 IMPRESSION: Hyperdensity over the right MCA territory, likely contrast staining.   09/11/20 CT HEAD CODE STROKE WO CONTRAST IMPRESSION: 1. No evidence of acute intracranial abnormality. 2. ASPECTS is 10. 3. Moderate chronic small vessel ischemic disease.   09/11/20 CT ANGIO HEAD NECK W WO CM W PERF  IMPRESSION: 1. Proximal right M2 occlusion. 2. CTP demonstrates a right MCA core infarct with large penumbra as detailed above. 3. Advanced intracranial atherosclerosis including moderate bilateral ICA stenoses, a severe basilar artery stenosis, and severe bilateral proximal PCA stenoses. 4. Cervical carotid atherosclerosis without significant stenosis. 5. Severe proximal left vertebral artery stenosis.   09/12/20 Echocardiogram   1. Left ventricular ejection fraction, by estimation, is 65 to 70%. The left ventricle has normal function. The left ventricle has no regional wall motion abnormalities. Left ventricular diastolic parameters are indeterminate.   2. RV-RA gradient normal at 23 mmHg. Right ventricular systolic function is normal. The right ventricular size is normal.   3. Left atrial size was severely dilated.   4. The mitral valve is degenerative. Mild mitral valve regurgitation.   5. The aortic valve is tricuspid. There is moderate calcification of the aortic valve. Aortic valve regurgitation is not visualized. Moderate aortic valve stenosis. Aortic valve mean gradient measures 11.0 mmHg. Dimenionless  index 0.41.   6. Aortic dilatation noted. There is borderline dilatation of the aortic root, measuring 39 mm.   7. Unable to estimate CVP.  PHYSICAL EXAM General -elderly Caucasian male appears chronically ill. Pulmonary: tolerating room air.   Neuro -patient is drowsy but can be aroused with some difficulty.  Mumbles a few words but is dysarthric and difficult to understand PERRL. Left eye proptosis noted. Corneal present, gag and cough present.  . Attempts to answer questions but not intelligible. Not able to assess orientation though likely not oriented. Will track examiner.  Detailed motor exam difficult. Moving all ext spontaneously. In restraints Sensation, coordination and gait not tested due to mentation.  ASSESSMENT/PLAN Mr. MEZIAH BLASINGAME is a 87 y.o. male with history of DM2, CKD stage 3a, HTN, HL, hx a fib off AC 2/2 hx GIB, mild cognitive impairment and frequent falls who presented after a fall, non verbal and L sided weakness at ALF. He did not receive IV t-PA due to head trauma with fall. The pt was taken to IR for intervention for thrombectomy Rt M2 lesion.  #Stroke: Rt MCA large infarct due to right M2 occlusion status post IR, embolic, likely due to atrial fibrillation not on anticoagulation. CT Head - No evidence of acute intracranial abnormality.  CTA H&N - Proximal right M2 occlusion. Advanced intracranial atherosclerosis including moderate bilateral ICA stenoses, a severe basilar artery stenosis, and severe bilateral proximal PCA stenoses.  CTP 40/144 cc, positive for penumbra MRI brain - right MCA large infarct with petechial hemorrhage MRA head - patent right M2 2D Echo EF 65-70% LDL - 100 HgbA1c 7.8 VTE prophylaxis -Lovenox No antithrombotics prior to admission, now on aspirin 325. Will consider  eliquis early next week (10 days post stroke) Ongoing aggressive stroke risk factor management Therapy recommendations:  SNF Disposition:  Pending  #Chronic Afib Was  on Coumadin in the past Coumadin discontinued in 10/2019 due to GI bleeding Follow-up with cardiology, wife and pt declined watchman device  Now on heparin for treatment of atrial fibrillation, will consider AC with eliquis in 7 to 10 days post stroke (early next week)  #Hx of GIB Per discharge documentation on 9/21, he has a history of a GI Bleed that occurred in August of 2021 ( no source found on colonoscopy did show multiple submucosal polyps) followed by large volume hematochezia on 10/26/19. GI was consulted and he underwent colonoscopy on 10/29/19 which revealed large fecal impaction with underlying deep ulcers in the rectum (nonbleeding) consistent with stercoral ulcers. This was thought to be the cause of the bleeding. - At this time do not feel GI findings are a contraindication for anticoagulation   #Upper Airway Secretions  #Cough  Per nursing patient has had difficulty coughing up his secretions - Pulmonary toilet: Albuterol treatments Q4  + OOB to chair + Percussion vest  - CXR ordered 09/22/20 to evaluate for aspiration follow up on this   #Hypertension Pressures are normotensive with elevations to the 140 range. He is on metoprolol and doxazosin. Current blood pressure goal is normotension, he is at goal   #Hyperlipidemia LDL 100, goal < 70, started on Lipitor 40 mg QD this admission, will continue at discharge   #Post Stroke Dysphagia  He failed bedside swallow evaluation and also failed evaluation with ST who saw him for the first time on the 20th post extubation. They have not attempted an MBS yet. Will ask their thoughts on MBS and possible need for peg  - Follow up with speech therapy   #DMII  He has a history of diabetes and was on Lantus + Novolog at home. Are treating hyperglycemia with SSI this admission. Hemoglobin A1c for stroke screening is 7.8, goal is < 7.  - Continue scheduled Novolog and sliding scale Novolog  - Follow up with PCP at discharge for management     Other Active Problems Code status - Full code CKD - stage 3b: Trending creatinine  Chronic fractures related to fall - hip, pelvic and LEFT radius and ulna fxs  Mild leukocytosis: Trending  Elevated MCV - B12 elevated, folate 27.2   Patient  .  Continues to have dysphagia and hence we will continue core track tube feeding.  Continue insulin dose and free water to improve renal function and blood glucose control.  Continue IV heparin for his A. fib.  Palliative care consult to discuss goals of care with the patient's wife prior to making decision about PEG tube which he will likely need.  .  Discussed with Dr. Phillips Odor.  Greater than 50% time during this 25-minute visit was spent on counseling and coordination of care and discussion with care team and family and answering questions  Delia Heady, MD Hospital day # 11

## 2020-09-22 NOTE — Progress Notes (Signed)
ANTICOAGULATION CONSULT NOTE  Pharmacy Consult for Heparin Indication: Afib / CVA  Allergies  Allergen Reactions   Baclofen Other (See Comments)    lethargy    Patient Measurements: Height: 5\' 7"  (170.2 cm) Weight: 68.1 kg (150 lb 2.1 oz) IBW/kg (Calculated) : 66.1   Vital Signs: Temp: 99.5 F (37.5 C) (08/03 0334) Temp Source: Oral (08/03 0334) BP: 130/71 (08/03 0334) Pulse Rate: 92 (08/03 0334)  Labs: Recent Labs    09/19/20 1642 09/20/20 0244 09/21/20 0345 09/22/20 0352  HGB  --   --   --  12.9*  HCT  --   --   --  38.8*  PLT  --   --   --  246  HEPARINUNFRC  --   --   --  0.12*  CREATININE 1.90* 1.85* 1.88*  --      Estimated Creatinine Clearance: 27.3 mL/min (A) (by C-G formula based on SCr of 1.88 mg/dL (H)).  Assessment: 87 y.o. male with Afib s/p recent CVA for heparin  Goal of Therapy:  Heparin level 0.3-0.5 units/ml Monitor platelets by anticoagulation protocol: Yes   Plan:  Increase Heparin 1000 units/hr Check heparin level in 8 hours.  97, PharmD, BCPS  09/22/2020,4:39 AM

## 2020-09-22 NOTE — Progress Notes (Signed)
Occupational Therapy Treatment Patient Details Name: Andre Adams MRN: [id number] DOB: April 14, 1933 Today's Date: 09/22/2020    History of present illness Pt is an 87 y.o. male who presented from an ALF on 09/11/20 s/p fall with L-sided weakness. NIHSS = 20. No tPA administered 2/2 fall with head trauma. 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 Extubated 7/29. PMH: DM2, CKD stage 3a, HTN, HL, hx a fib off AC 2/2 hx GIB   OT comments  Patient with limited session this date.  Found slumped in bed leaning to the L.  Tube feeding turned off, attempted sitting edge of bed with total assist.  Patient with his eyes closed throughout, and actively resisting.  Patient lied down and head of bed brought above 30 degress for tube feedings.  Patient with thick/wet secretions throughout session.  OT attempted to suction, but the patient would push the yankauer away.  OT to continue efforts in the acute setting, but SNF is recommended due to heavy 24 hour care needed.     Follow Up Recommendations  SNF    Equipment Recommendations       Recommendations for Other Services      Precautions / Restrictions Precautions Precautions: Fall Precaution Comments: L wrist ace-wrapped, written on board "L wrist NWB and splnt until 8/4" Restrictions Weight Bearing Restrictions: Yes LUE Weight Bearing: Non weight bearing Other Position/Activity Restrictions: x-ray l hand: "No definitive superimposed acute fracture is noted. If persistent clinical concern for scaphoid fracture, recommend immobilization and follow-up radiographs in 2 weeks."       Mobility Bed Mobility Overal bed mobility: Needs Assistance Bed Mobility: Supine to Sit;Sit to Supine     Supine to sit: Total assist Sit to supine: Total assist   General bed mobility comments: patient acitively resisting. Patient Response: Anxious;Restless  Transfers                  General transfer comment: deferred due to decreased LOA    Balance Overall balance assessment: Needs assistance   Sitting balance-Leahy Scale: Zero   Postural control: Left lateral lean;Posterior lean                                                      Cognition Arousal/Alertness: Lethargic Behavior During Therapy: Restless;Anxious Overall Cognitive Status: Impaired/Different from baseline                 Rancho Levels of Cognitive Functioning Rancho [email or web address] Scales of Cognitive Functioning: Confused/agitated                                          Pertinent Vitals/ Pain       Pain Assessment: Faces Faces Pain Scale: No hurt Pain Intervention(s): Monitored during session                                                          Frequency  Min 2X/week        Progress Toward Goals  OT Goals(current goals can now be found in the care plan section)     Acute Rehab OT Goals Patient Stated Goal: not stated OT Goal Formulation: With family Time For Goal Achievement: 10/01/20 Potential to Achieve Goals: Good  Plan Discharge plan remains appropriate    Co-evaluation                 AM-PAC OT "6 Clicks" Daily Activity     Outcome Measure   Help from another person eating meals?: Total Help from another person taking care of personal grooming?: Total Help from another person toileting, which includes using toliet, bedpan, or urinal?: Total Help from another person bathing (including washing, rinsing, drying)?: Total Help from another person to put on and taking off regular upper body clothing?: Total Help from another person to put on and taking off regular lower body clothing?: Total 6 Click Score: 6    End of Session    OT Visit Diagnosis: Cognitive communication deficit (R41.841);Muscle weakness (generalized) (M62.81)   Activity Tolerance Patient limited by lethargy   Patient  Left in bed;with call bell/phone within reach;with bed alarm set   Nurse Communication          Time: 1130-1144 OT Time Calculation (min): 14 min  Charges: OT General Charges $OT Visit: 1 Visit OT Treatments $Therapeutic Activity: 8-22 mins  09/22/2020  Andre Adams, OTR/L  Acute Rehabilitation Services  Office:  [phone]    Andre Adams 09/22/2020, 12:15 PM

## 2020-09-22 NOTE — Progress Notes (Signed)
PT Cancellation Note  Patient Details Name: Andre Adams MRN: [id number] DOB: [birth year]   Cancelled Treatment:    Reason Eval/Treat Not Completed: Other (comment);Fatigue/lethargy limiting ability to participate.  Sleepy from OT session, will retry as time and pt allow.   Ivar Drape 09/22/2020, 12:54 PM  Samul Dada, PT MS Acute Rehab Dept. Number: [facility] [id number] and [facility] [phone]

## 2020-09-23 DIAGNOSIS — I69391 Dysphagia following cerebral infarction: Secondary | ICD-10-CM

## 2020-09-23 DIAGNOSIS — Z515 Encounter for palliative care: Secondary | ICD-10-CM

## 2020-09-23 DIAGNOSIS — Z638 Other specified problems related to primary support group: Secondary | ICD-10-CM

## 2020-09-23 DIAGNOSIS — Z7189 Other specified counseling: Secondary | ICD-10-CM | POA: Diagnosis not present

## 2020-09-23 DIAGNOSIS — I63511 Cerebral infarction due to unspecified occlusion or stenosis of right middle cerebral artery: Secondary | ICD-10-CM | POA: Diagnosis not present

## 2020-09-23 LAB — HEPARIN LEVEL (UNFRACTIONATED)
Heparin Unfractionated: 0.28 IU/mL — ABNORMAL LOW (ref 0.30–0.70)
Heparin Unfractionated: 0.38 IU/mL (ref 0.30–0.70)

## 2020-09-23 LAB — CBC
HCT: 39.6 % (ref 39.0–52.0)
Hemoglobin: 12.8 g/dL — ABNORMAL LOW (ref 13.0–17.0)
MCH: 32.2 pg (ref 26.0–34.0)
MCHC: 32.3 g/dL (ref 30.0–36.0)
MCV: 99.7 fL (ref 80.0–100.0)
Platelets: 247 10*3/uL (ref 150–400)
RBC: 3.97 MIL/uL — ABNORMAL LOW (ref 4.22–5.81)
RDW: 12.5 % (ref 11.5–15.5)
WBC: 12 10*3/uL — ABNORMAL HIGH (ref 4.0–10.5)
nRBC: 0 % (ref 0.0–0.2)

## 2020-09-23 LAB — GLUCOSE, CAPILLARY
Glucose-Capillary: 175 mg/dL — ABNORMAL HIGH (ref 70–99)
Glucose-Capillary: 140 mg/dL — ABNORMAL HIGH (ref 70–99)
Glucose-Capillary: 176 mg/dL — ABNORMAL HIGH (ref 70–99)
Glucose-Capillary: 104 mg/dL — ABNORMAL HIGH (ref 70–99)

## 2020-09-23 MED ORDER — GLYCOPYRROLATE 0.2 MG/ML IJ SOLN
0.2000 mg | Freq: Four times a day (QID) | INTRAMUSCULAR | Status: DC
  Administered 2020-09-23 – 2020-09-26 (×12): 0.2 mg via INTRAVENOUS
  Filled 2020-09-23 (×12): qty 1

## 2020-09-23 MED ORDER — ONDANSETRON 4 MG PO TBDP: 4.0000 mg | ORAL_TABLET | Freq: Four times a day (QID) | ORAL | Status: DC | PRN

## 2020-09-23 MED ORDER — MORPHINE SULFATE (PF) 2 MG/ML IV SOLN
2.0000 mg | Freq: Four times a day (QID) | INTRAVENOUS | Status: DC
Start: 2020-09-23 — End: 2020-09-26
  Administered 2020-09-23 – 2020-09-26 (×11): 2 mg via INTRAVENOUS
  Filled 2020-09-23 (×11): qty 1

## 2020-09-23 MED ORDER — HALOPERIDOL LACTATE 2 MG/ML PO CONC
0.5000 mg | ORAL | Status: DC | PRN
  Filled 2020-09-23: qty 0.3

## 2020-09-23 MED ORDER — LORAZEPAM 2 MG/ML IJ SOLN
2.0000 mg | INTRAMUSCULAR | Status: DC | PRN
  Administered 2020-09-25: 2 mg via INTRAVENOUS
  Filled 2020-09-23: qty 1

## 2020-09-23 MED ORDER — ONDANSETRON HCL 4 MG/2ML IJ SOLN: 4.0000 mg | Freq: Four times a day (QID) | INTRAMUSCULAR | Status: DC | PRN

## 2020-09-23 MED ORDER — HALOPERIDOL 0.5 MG PO TABS
0.5000 mg | ORAL_TABLET | ORAL | Status: DC | PRN
  Filled 2020-09-23: qty 1

## 2020-09-23 MED ORDER — HALOPERIDOL LACTATE 5 MG/ML IJ SOLN: 0.5000 mg | INTRAMUSCULAR | Status: DC | PRN

## 2020-09-23 MED ORDER — BIOTENE DRY MOUTH MT LIQD: 15.0000 mL | OROMUCOSAL | Status: DC | PRN

## 2020-09-23 MED ORDER — POLYVINYL ALCOHOL 1.4 % OP SOLN
1.0000 [drp] | Freq: Four times a day (QID) | OPHTHALMIC | Status: DC | PRN
  Filled 2020-09-23: qty 15

## 2020-09-23 NOTE — TOC Initial Note (Signed)
Transition of Care [facility]) - Initial/Assessment Note    Patient Details  Name: Andre Adams MRN: [id number] Date of Birth: 12/19/1933  Transition of Care [facility]) CM/SW Contact:    Baldemar Lenis, LCSW Phone Number: 09/23/2020, 4:11 PM  Clinical Narrative:         CSW alerted by palliative NP that patient's spouse interested in hospice home through [facility]. CSW spoke with hospital liaison Maura and provided referral. Maura to reach out to patient's spouse to discuss referral, but no bed is available today. CSW to continue to follow.          Expected Discharge Plan: Hospice Medical Facility Barriers to Discharge: Continued Medical Work up, Hospice Bed not available   Patient Goals and CMS Choice Patient states their goals for this hospitalization and ongoing recovery are:: patient unable to participate in goal setting, not oriented CMS Medicare.gov Compare Post Acute Care list provided to:: Patient Represenative (must comment) Choice offered to / list presented to : Spouse  Expected Discharge Plan and Services Expected Discharge Plan: Hospice Medical Facility     Post Acute Care Choice: Hospice Living arrangements for the past 2 months: Hospice Facility                                      Prior Living Arrangements/Services Living arrangements for the past 2 months: Hospice Facility Lives with:: Facility Resident Patient language and need for interpreter reviewed:: No Do you feel safe going back to the place where you live?: Yes      Need for Family Participation in Patient Care: Yes (Comment) Care giver support system in place?: No (comment)   Criminal Activity/Legal Involvement Pertinent to Current Situation/Hospitalization: No - Comment as needed  Activities of Daily Living Home Assistive Devices/Equipment: Dan Humphreys (specify type) ADL Screening (condition at time of admission) Patient's cognitive ability adequate to safely complete daily  activities?: Yes Is the patient deaf or have difficulty hearing?: No Does the patient have difficulty seeing, even when wearing glasses/contacts?: No Does the patient have difficulty concentrating, remembering, or making decisions?: No Patient able to express need for assistance with ADLs?: Yes Does the patient have difficulty dressing or bathing?: Yes Independently performs ADLs?: No Communication: Independent Dressing (OT): Needs assistance Is this a change from baseline?: Pre-admission baseline Grooming: Needs assistance Is this a change from baseline?: Pre-admission baseline Feeding: Independent Bathing: Needs assistance Is this a change from baseline?: Pre-admission baseline Toileting: Needs assistance Is this a change from baseline?: Pre-admission baseline In/Out Bed: Needs assistance Is this a change from baseline?: Pre-admission baseline Walks in Home: Independent with device (comment) (walker) Does the patient have difficulty walking or climbing stairs?: Yes Weakness of Legs: Both Weakness of Arms/Hands: Both  Permission Sought/Granted Permission sought to share information with : Facility Medical sales representative, Family Supports Permission granted to share information with : Yes, Verbal Permission Granted  Share Information with NAME: Steward Drone  Permission granted to share info w AGENCY: [facility]  Permission granted to share info w Relationship: Spouse     Emotional Assessment   Attitude/Demeanor/Rapport: Unable to Assess Affect (typically observed): Unable to Assess Orientation: : Oriented to Self Alcohol / Substance Use: Not Applicable Psych Involvement: No (comment)  Admission diagnosis:  Abnormal chest x-ray [R93.89] Acute ischemic right MCA stroke (HCC) [I63.511] Acute CVA (cerebrovascular accident) [facility]) [I63.9] Patient Active Problem List   Diagnosis Date  Noted   Acute respiratory failure with hypoxia (HCC)    Endotracheally intubated  09/12/2020   Acute CVA (cerebrovascular accident) [facility])    AKI (acute kidney injury) (HCC)    Acute ischemic right MCA stroke (HCC) 09/11/2020   Chronic atrial fibrillation (HCC)    PCP:  Leola Brazil, DO Pharmacy:  No Pharmacies Listed    Social Determinants of Health (SDOH) Interventions    Readmission Risk Interventions No flowsheet data found.

## 2020-09-23 NOTE — Progress Notes (Signed)
ANTICOAGULATION CONSULT NOTE - Follow Up Consult  Pharmacy Consult for Heparin Indication: Afib / CVA  Allergies  Allergen Reactions   Baclofen Other (See Comments)    lethargy    Patient Measurements: Height: 5\' 7"  (170.2 cm) Weight: 66.9 kg (147 lb 7.8 oz) IBW/kg (Calculated) : 66.1   Vital Signs: Temp: 97.9 F (36.6 C) (08/04 1131) Temp Source: Oral (08/04 1131) BP: 127/63 (08/04 1131) Pulse Rate: 76 (08/04 1131)  Labs: Recent Labs    09/21/20 0345 09/22/20 0352 09/22/20 0352 09/22/20 1215 09/23/20 0030 09/23/20 0221 09/23/20 1100  HGB  --  12.9*  --   --  12.8*  --   --   HCT  --  38.8*  --   --  39.6  --   --   PLT  --  246  --   --  247  --   --   HEPARINUNFRC  --  0.12*   < > 0.16*  --  0.28* 0.38  CREATININE 1.88*  --   --   --   --   --   --    < > = values in this interval not displayed.     Estimated Creatinine Clearance: 27.3 mL/min (A) (by C-G formula based on SCr of 1.88 mg/dL (H)).  Assessment: 87 year old male admitted with CVA 7/24, now starting heparin for Afib / CVA prevention.  History of GI bleed on warfarin in September of 2021.  Planning long-term Eliquis eventually.  Heparin came back therapeutic today at 0.38. Continue with heparin until he is ready for NOAC.  Goal of Therapy:  Heparin level 0.3-0.5 units/ml Monitor platelets by anticoagulation protocol: Yes   Plan:  Cont heparin 1250 units / hr Heparin level and CBC daily  11-22-1995, PharmD, [place], AAHIVP, CPP Infectious Disease Pharmacist 09/23/2020 12:53 PM

## 2020-09-23 NOTE — Progress Notes (Signed)
  Speech Language Pathology Treatment: Dysphagia  Patient Details Name: Andre Adams MRN: [id number] DOB: April 11, 1933 Today's Date: 09/23/2020 Time: 0940-1010 SLP Time Calculation (min) (ACUTE ONLY): 30 min  Assessment / Plan / Recommendation Clinical Impression  Pt demonstrates ongoing severe dysphagia. He is alert, but still not managing secretions, was found covered in pooled saliva and mucous. He is coughing on secretions. When given teaspoon feeding, pt has severe oral pooling. With heavy assist pt can attend to the right and take sips from a straw. These result in immediate prolonged hard coughing with thin and nectar. Puree as well eventually elicits coughing after swallow. Pt not only demonstrates severe dysphagia, but also impaired cognition which impacts attention and interest in PO. Pts baseline deconditioning and poor posture further makes him difficult to position and feed with any compensatory strategies. He will be almost impossible to effectively feed in a rehab facility. He will aspirate secretions regardless of feeding method. MBS not needed to assess swallowing further at this time given the severity of deficits. He would also not be able to sit in chair with current positioning impairment. Recommend ongoing NPO status with consideration for comfort measures. Discussed severity of dysphagia and risk of aspiration with wife at bedside. She verbalizes interest in speaking to palliative care providers. WIll f/u for needs.   HPI HPI: Pt is an 87 y.o. male who presented from an ALF on 09/11/20 s/p fall with L-sided weakness. NIHSS = 20. No tPA administered 2/2 fall with head trauma. 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 ETT 7/23-7/29. PMH: DM2, CKD stage 3a, HTN, HL, hx a fib off AC 2/2 hx GIB      SLP Plan  Continue with current plan of care       Recommendations  Diet recommendations: Other(comment) (comfort  feeding can be considered, but unlikely to bring comfort)                Plan: Continue with current plan of care       GO                Ashelyn Mccravy, Riley Nearing 09/23/2020, 10:27 AM

## 2020-09-23 NOTE — Progress Notes (Signed)
ANTICOAGULATION CONSULT NOTE - Follow Up Consult  Pharmacy Consult for Heparin Indication: Afib / CVA  Labs: Recent Labs    09/21/20 0345 09/22/20 0352 09/22/20 1215 09/23/20 0030 09/23/20 0221  HGB  --  12.9*  --  12.8*  --   HCT  --  38.8*  --  39.6  --   PLT  --  246  --  247  --   HEPARINUNFRC  --  0.12* 0.16*  --  0.28*  CREATININE 1.88*  --   --   --   --    Assessment: 87 year old male admitted with CVA 7/24, now starting heparin for Afib / CVA prevention.  History of GI bleed on warfarin in September of 2021.  Planning long-term Eliquis eventually. Heparin level this am 0.28 units/ml  Goal of Therapy:  Heparin level 0.3-0.5 units/ml Monitor platelets by anticoagulation protocol: Yes   Plan:  Increase heparin to 1250 units / hr Check heparin level in 6-8 hours Heparin level and CBC daily  Thanks for allowing pharmacy to be a part of this patient's care.  Talbert Cage, PharmD Clinical Pharmacist  09/23/2020 3:11 AM

## 2020-09-23 NOTE — Progress Notes (Signed)
1st Attempt to call report to [facility] nurse receiving patient. Told that RN cannot take call at this time. Will call back after 2030 if no return call by 2030.

## 2020-09-23 NOTE — Progress Notes (Signed)
Physical Therapy Treatment Patient Details Name: Andre Adams MRN: [id number] DOB: 03/23/1933 Today's Date: 09/23/2020    History of Present Illness Pt is an 87 y.o. male who presented from an ALF on 09/11/20 s/p fall with L-sided weakness. NIHSS = 20. No tPA administered 2/2 fall with head trauma. 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 Extubated 7/29. PMH: DM2, CKD stage 3a, HTN, HL, hx a fib off AC 2/2 hx GIB    PT Comments    Pt continues to demonstrate strong leftward lean throughout session, although intermittently able to maintain sitting balance with limited assistance. Pt progresses to SPT to bedside commode, however pt begins to demonstrate strong leftward lean during SPT and throughout time on commode. PT assists pt back to bed due to safety concerns. Pt will benefit from continued acute PT services to improve balance and reduce caregiver burden.   Follow Up Recommendations  SNF     Equipment Recommendations  Other (comment) (defer to post-acute)    Recommendations for Other Services       Precautions / Restrictions Precautions Precautions: Fall Precaution Comments: L wrist ace-wrapped, written on board "L wrist NWB and splnt until 8/4" Restrictions Weight Bearing Restrictions: Yes LUE Weight Bearing: Non weight bearing Other Position/Activity Restrictions: x-ray l hand: "No definitive superimposed acute fracture is noted. If persistent clinical concern for scaphoid fracture, recommend immobilization and follow-up radiographs in 2 weeks."    Mobility  Bed Mobility Overal bed mobility: Needs Assistance Bed Mobility: Supine to Sit;Sit to Supine Rolling: Max assist   Supine to sit: Max assist;HOB elevated Sit to supine: Total assist        Transfers Overall transfer level: Needs assistance Equipment used: 1 person hand held assist Transfers: Sit to/from [employer] Transfers Sit to Stand:  Mod assist;Max assist Stand pivot transfers: Total assist       General transfer comment: modA for initial sit to stand, maxA for following sit to stands x 4, totalA for SPT x2, pt with strong left lateral lean later in session  Ambulation/Gait                 Stairs             Wheelchair Mobility    Modified Rankin (Stroke Patients Only) Modified Rankin (Stroke Patients Only) Pre-Morbid Rankin Score: Moderate disability Modified Rankin: Severe disability     Balance Overall balance assessment: Needs assistance Sitting-balance support: Feet supported;Single extremity supported;Bilateral upper extremity supported Sitting balance-Leahy Scale: Poor Sitting balance - Comments: minA-maxA. At times minA for safety, initially upon sitting and when on bedside commode maxA-totalA due to strong leftward lean Postural control: Left lateral lean Standing balance support: Bilateral upper extremity supported Standing balance-Leahy Scale: Poor Standing balance comment: mod-totalA                            Cognition Arousal/Alertness: Awake/alert Behavior During Therapy: Restless;Impulsive Overall Cognitive Status: Impaired/Different from baseline Area of Impairment: Attention;Memory;Following commands;Safety/judgement;Awareness;Problem solving                   Current Attention Level: Sustained Memory: Decreased recall of precautions;Decreased short-term memory Following Commands: Follows one step commands inconsistently Safety/Judgement: Decreased awareness of safety;Decreased awareness of deficits Awareness: Intellectual Problem Solving: Slow processing;Decreased initiation;Difficulty sequencing;Requires verbal cues;Requires tactile cues        Exercises      General Comments  General comments (skin integrity, edema, etc.): VSS on RA, pt with multiple bowel movements during session      Pertinent Vitals/Pain Pain Assessment: Faces Faces  Pain Scale: Hurts even more Pain Location: buttocks and groin during pericare Pain Descriptors / Indicators: Grimacing;Sore Pain Intervention(s): Monitored during session    Home Living                      Prior Function            PT Goals (current goals can now be found in the care plan section) Acute Rehab PT Goals Patient Stated Goal: not stated Progress towards PT goals: Progressing toward goals (slow progress)    Frequency    Min 3X/week      PT Plan Current plan remains appropriate    Co-evaluation              AM-PAC PT "6 Clicks" Mobility   Outcome Measure  Help needed turning from your back to your side while in a flat bed without using bedrails?: A Lot Help needed moving from lying on your back to sitting on the side of a flat bed without using bedrails?: Total Help needed moving to and from a bed to a chair (including a wheelchair)?: Total Help needed standing up from a chair using your arms (e.g., wheelchair or bedside chair)?: Total Help needed to walk in hospital room?: Total Help needed climbing 3-5 steps with a railing? : Total 6 Click Score: 7    End of Session   Activity Tolerance: Patient tolerated treatment well Patient left: in bed;with call bell/phone within reach;with bed alarm set;Other (comment) (mitts applied) Nurse Communication: Mobility status;Need for lift equipment PT Visit Diagnosis: Muscle weakness (generalized) (M62.81);History of falling (Z91.81);Difficulty in walking, not elsewhere classified (R26.2);Other symptoms and signs involving the nervous system (R29.898);Apraxia (R48.2);Unsteadiness on feet (R26.81)     Time: 2409-7353 PT Time Calculation (min) (ACUTE ONLY): 44 min  Charges:  $Therapeutic Activity: 38-52 mins                     Arlyss Gandy, PT, DPT Acute Rehabilitation Pager: [phone]    Arlyss Gandy 09/23/2020, 5:06 PM

## 2020-09-23 NOTE — Consult Note (Signed)
Consultation Note Date: 09/23/2020   Patient Name: Andre Adams  DOB: 18-Aug-1933  MRN: [id number]  Age / Sex: 87 y.o., male  PCP: Nicola Girt, DO Referring Physician: Stroke, Md, MD  Reason for Consultation: Establishing goals of care  HPI/Patient Profile: 87 y.o. male  with past medical history of DM2, HTN, HL, a fib, GIB, frequent falls. admitted on 09/11/2020 with fall and L sided weakness. Workup revealed large infarct resulting in significant cognitive and physcal deficits. He was intubated 7/23-7/29. He is nonverbal, severe dysphagia, unable to control secretions, he does not show interest in po intake, continuously aspirating. Per discussion with attending and spouse- he would not want PEG. Palliative medicine consulted for [place].   Clinical Assessment and Goals of Care: Met with patient's spouse- Andre Adams.  Patient was unable to participate in [place] discussion.  Andre Adams shares that patient is retired from [employer]. He has had ongoing decline over the last several months with frequent falls, and was living in a nursing facility.  Andre Adams clearly and easily states that [place] for patient are for comfort. She understands that patient is dying and her only wish is to limit his suffering.  She feels his secretions are causing him suffering. Additionally, he looks uncomfortable and as though he is in pain anytime he is moved. He cannot communicate his needs or if he is in pain or not.  We discussed what transitioning his plan of care to comfort measures only would look like- stopping artificial feeding and hydration, stopping maintenance medications. Providing symptom management to decrease his suffering and to support him through his dying process.  Disposition was discussed- Andre Adams requests to seek placement in the Hospice house in [facility] due to the proximity to her home.  Andre Adams is concerned about some possible  family conflict with her decision- Mr. Lacivita has one daughter, however, Andre Adams understands that as his wife she is her husband's primary Media planner.    Primary Decision Maker NEXT OF KIN- spouse- Andre Adams.    SUMMARY OF RECOMMENDATIONS -Large, debilitating CVA- GOC are full comfort, no PEG -Symptom management- patient will require IV medications for symptom management due to his inability to take po and ongoing aspiration of his secretions-  -Secretions- start Robinul 0.21mg IV q4hr  -Pain- morphine 259mIV q6hr -Other prn medications as ordered -D/C all interventions and medications that are not contributing to comfort -PMT will facilitate meeting with patient's spouse and Daughter tomorrow morning at 1031o relay care plan to daughter and assist with complicated family dynamics Disposition- TOC referral for inpatient Hospice facility placement    Code Status/Advance Care Planning: DNR   Prognosis:   < 2 weeks due to severe, debilitating dysphagia with continuous aspiration in the setting of large CVA with plans for comfort measures only  Discharge Planning: Hospice facility  Primary Diagnoses: Present on Admission:  Acute ischemic right MCA stroke (HCSan Cristobal  Review of Systems  Unable to perform ROS: Mental status change   Physical Exam Vitals and nursing note reviewed.  Constitutional:      Appearance: He is ill-appearing.     Comments: Frail, cachetic  HENT:     Mouth/Throat:     Comments: Uncontrolled secretions, drooling Cardiovascular:     Rate and Rhythm: Rhythm irregular.  Pulmonary:     Comments: Cough at times Neurological:     Comments: nonverbal    Vital Signs: BP 125/78 (BP Location: Left Arm)   Pulse 85   Temp 98 F (36.7 C) (Oral)   Resp 14   Ht _0  (1.702 m)   Wt 66.9 kg   SpO2 98%   BMI 23.10 kg/m  Pain Scale: PAINAD   Pain Score: Asleep   SpO2: SpO2: 98 % O2 Device:SpO2: 98 % O2 Flow Rate: .O2 Flow Rate (L/min): 4 L/min  IO:  Intake/output summary:  Intake/Output Summary (Last 24 hours) at 09/23/2020 1615 Last data filed at 09/23/2020 0427 Gross per 24 hour  Intake --  Output 500 ml  Net -500 ml    LBM: Last BM Date: 09/23/20 Baseline Weight: Weight: 68 kg Most recent weight: Weight: 66.9 kg     Palliative Assessment/Data: PPS: 10%     Thank you for this consult. Palliative medicine will continue to follow and assist as needed.   Time In: 1503 Time Out: 1635 Time Total: 92 minutes Greater than 50%  of this time was spent counseling and coordinating care related to the above assessment and plan.  Signed by: Mariana Kaufman, AGNP-C Palliative Medicine    Please contact Palliative Medicine Team phone at [phone] for questions and concerns.  For individual provider: See Shea Evans

## 2020-09-23 NOTE — Progress Notes (Addendum)
STROKE TEAM PROGRESS NOTE   INTERVAL HISTORY CXR ordered on 8/3 was non revealing. On exam today secretion control seems lightly better.   Spoke with ST this morning- prognosis for dysphagia improvement is poor and he will likely need a peg. The patients wife does not want him to have a peg as she states he would not want to live like that. She is open to meeting palliative care and learning about options to keep him comfortable/ how to move forward from here.   Spoke with palliative over the phone- they should be able to see him today.   Pertinent Imaging and Lab work   CT HEAD WO CONTRAST 09/12/2020 IMPRESSION: Hyperdensity over the right MCA territory, likely contrast staining.   09/11/20 CT HEAD CODE STROKE WO CONTRAST IMPRESSION: 1. No evidence of acute intracranial abnormality. 2. ASPECTS is 10. 3. Moderate chronic small vessel ischemic disease.   09/11/20 CT ANGIO HEAD NECK W WO CM W PERF  IMPRESSION: 1. Proximal right M2 occlusion. 2. CTP demonstrates a right MCA core infarct with large penumbra as detailed above. 3. Advanced intracranial atherosclerosis including moderate bilateral ICA stenoses, a severe basilar artery stenosis, and severe bilateral proximal PCA stenoses. 4. Cervical carotid atherosclerosis without significant stenosis. 5. Severe proximal left vertebral artery stenosis.   09/12/20 Echocardiogram   1. Left ventricular ejection fraction, by estimation, is 65 to 70%. The left ventricle has normal function. The left ventricle has no regional wall motion abnormalities. Left ventricular diastolic parameters are indeterminate.   2. RV-RA gradient normal at 23 mmHg. Right ventricular systolic function is normal. The right ventricular size is normal.   3. Left atrial size was severely dilated.   4. The mitral valve is degenerative. Mild mitral valve regurgitation.   5. The aortic valve is tricuspid. There is moderate calcification of the aortic valve. Aortic valve  regurgitation is not visualized. Moderate aortic valve stenosis. Aortic valve mean gradient measures 11.0 mmHg. Dimenionless index 0.41.   6. Aortic dilatation noted. There is borderline dilatation of the aortic root, measuring 39 mm.   7. Unable to estimate CVP.  PHYSICAL EXAM General -elderly Caucasian male appears chronically ill. Pulmonary: tolerating room air.   Neuro -patient is drowsy but can be aroused with some difficulty.  Mumbles a few words but is dysarthric and difficult to understand PERRL. Left eye proptosis noted. Corneal present, gag and cough present.  . Attempts to answer questions but not intelligible. Not able to assess orientation though likely not oriented. Will track examiner.  Detailed motor exam difficult. Moving all ext spontaneously. In restraints Sensation, coordination and gait not tested due to mentation.  ASSESSMENT/PLAN Mr. Andre Adams is a 87 y.o. male with history of DM2, CKD stage 3a, HTN, HL, hx a fib off AC 2/2 hx GIB, mild cognitive impairment and frequent falls who presented after a fall, non verbal and L sided weakness at ALF. He did not receive IV t-PA due to head trauma with fall. The pt was taken to IR for intervention for thrombectomy Rt M2 lesion.  #Stroke: Rt MCA large infarct due to right M2 occlusion status post IR, embolic, likely due to atrial fibrillation not on anticoagulation. CT Head - No evidence of acute intracranial abnormality.  CTA H&N - Proximal right M2 occlusion. Advanced intracranial atherosclerosis including moderate bilateral ICA stenoses, a severe basilar artery stenosis, and severe bilateral proximal PCA stenoses.  CTP 40/144 cc, positive for penumbra MRI brain - right MCA large infarct with petechial  hemorrhage MRA head - patent right M2 2D Echo EF 65-70% LDL - 100 HgbA1c 7.8 VTE prophylaxis -Lovenox No antithrombotics prior to admission, now on aspirin 325. Will consider eliquis early next week (10 days post  stroke) Ongoing aggressive stroke risk factor management Therapy recommendations:  SNF, however palliative care consulted on 09/22/20 due to ongoing dysphagia and difficulty managing secretions. The patients wife does not feel like a peg tube would be in accordance with his goals of care. Palliative to see.  Disposition:  Pending  #Chronic Afib Was on Coumadin in the past Coumadin discontinued in 10/2019 due to GI bleeding Follow-up with cardiology, wife and pt declined watchman device  Now on heparin for treatment of atrial fibrillation, will consider AC with eliquis in 7 to 10 days post stroke (early next week)  #Hx of GIB Per discharge documentation on 9/21, he has a history of a GI Bleed that occurred in August of 2021 ( no source found on colonoscopy did show multiple submucosal polyps) followed by large volume hematochezia on 10/26/19. GI was consulted and he underwent colonoscopy on 10/29/19 which revealed large fecal impaction with underlying deep ulcers in the rectum (nonbleeding) consistent with stercoral ulcers. This was thought to be the cause of the bleeding. - At this time do not feel GI findings are a contraindication for anticoagulation   #Upper Airway Secretions  #Cough  Per nursing patient has had difficulty coughing up his secretions - Pulmonary toilet: Albuterol treatments Q4  + OOB to chair + Percussion vest  - CXR 09/22/20- No significant change since 09/12/2020. Low lung volumes with coarse chronic interstitial opacities  #Hypertension Pressures are normotensive with elevations to the 140 range. He is on metoprolol and doxazosin. Current blood pressure goal is normotension, he is at goal   #Hyperlipidemia LDL 100, goal < 70, started on Lipitor 40 mg QD this admission, will continue at discharge   #Post Stroke Dysphagia  He failed bedside swallow evaluation and also failed evaluation with ST who saw him for the first time on the 20th post extubation. They have not attempted  an MBS yet and feel that the prognosis for swallow improvement is poor. The patients wife does not want him to have a peg tube as she feels he would not have wanted to live like that. Thus palliative care was consulted on 09/22/20.   #DMII  He has a history of diabetes and was on Lantus + Novolog at home. Are treating hyperglycemia with SSI this admission. Hemoglobin A1c for stroke screening is 7.8, goal is < 7.  - Continue scheduled Novolog and sliding scale Novolog  - Follow up with PCP at discharge for management    Other Active Problems Code status - Full code CKD - stage 3b: Trending creatinine  Chronic fractures related to fall - hip, pelvic and LEFT radius and ulna fxs  Mild leukocytosis: Trending  Elevated MCV - B12 elevated, folate 27.2   Andre Jock, NP  Stroke Service Nurse Practitioner  Patient seen and discussed with attending physician Dr. Pearlean Brownie   Patient continues to have dysphagia and hence we will continue core track tube feeding..  Patient wife is clear he would not want a PEG tube and patient cannot go to a skilled nursing home until he can swallow and has a PEG tube.  Patient's wife is open to meeting with palliative care this to discuss goals of care..  Continue IV heparin for his A. fib.    Greater than 50% time  during this 25-minute visit was spent on counseling and coordination of care and discussion with care team and family and answering questions  Delia Heady, MD Hospital day # 12

## 2020-09-23 NOTE — Progress Notes (Signed)
Patient received in bed to 6N28. Alert x1. Comfort measures only.

## 2020-09-24 DIAGNOSIS — Z66 Do not resuscitate: Secondary | ICD-10-CM

## 2020-09-24 DIAGNOSIS — Z515 Encounter for palliative care: Secondary | ICD-10-CM

## 2020-09-24 NOTE — Progress Notes (Signed)
STROKE TEAM PROGRESS NOTE   INTERVAL HISTORY Patient has not been made with hospice and full comfort care and moved to the hospice unit.  He is resting comfortably.  He can be easily aroused and answers questions and speaks appropriately.  He states he has no complaints.     Pertinent Imaging and Lab work   CT HEAD WO CONTRAST 09/12/2020 IMPRESSION: Hyperdensity over the right MCA territory, likely contrast staining.   09/11/20 CT HEAD CODE STROKE WO CONTRAST IMPRESSION: 1. No evidence of acute intracranial abnormality. 2. ASPECTS is 10. 3. Moderate chronic small vessel ischemic disease.   09/11/20 CT ANGIO HEAD NECK W WO CM W PERF  IMPRESSION: 1. Proximal right M2 occlusion. 2. CTP demonstrates a right MCA core infarct with large penumbra as detailed above. 3. Advanced intracranial atherosclerosis including moderate bilateral ICA stenoses, a severe basilar artery stenosis, and severe bilateral proximal PCA stenoses. 4. Cervical carotid atherosclerosis without significant stenosis. 5. Severe proximal left vertebral artery stenosis.   09/12/20 Echocardiogram   1. Left ventricular ejection fraction, by estimation, is 65 to 70%. The left ventricle has normal function. The left ventricle has no regional wall motion abnormalities. Left ventricular diastolic parameters are indeterminate.   2. RV-RA gradient normal at 23 mmHg. Right ventricular systolic function is normal. The right ventricular size is normal.   3. Left atrial size was severely dilated.   4. The mitral valve is degenerative. Mild mitral valve regurgitation.   5. The aortic valve is tricuspid. There is moderate calcification of the aortic valve. Aortic valve regurgitation is not visualized. Moderate aortic valve stenosis. Aortic valve mean gradient measures 11.0 mmHg. Dimenionless index 0.41.   6. Aortic dilatation noted. There is borderline dilatation of the aortic root, measuring 39 mm.   7. Unable to estimate CVP.  PHYSICAL  EXAM General -elderly Caucasian male appears chronically ill. Pulmonary: tolerating room air.   Neuro -patient is drowsy but can be aroused with some difficulty.  Mumbles a few words but is dysarthric and difficult to understand PERRL. Left eye proptosis noted. Corneal present, gag and cough present.  . Attempts to answer questions but not intelligible. Not able to assess orientation though likely not oriented. Will track examiner.  Detailed motor exam difficult. Moving all ext spontaneously. In restraints Sensation, coordination and gait not tested due to mentation.  ASSESSMENT/PLAN Mr. EVERTTE SONES is a 87 y.o. male with history of DM2, CKD stage 3a, HTN, HL, hx a fib off AC 2/2 hx GIB, mild cognitive impairment and frequent falls who presented after a fall, non verbal and L sided weakness at ALF. He did not receive IV t-PA due to head trauma with fall. The pt was taken to IR for intervention for thrombectomy Rt M2 lesion.  #Stroke: Rt MCA large infarct due to right M2 occlusion status post IR, embolic, likely due to atrial fibrillation not on anticoagulation. CT Head - No evidence of acute intracranial abnormality.  CTA H&N - Proximal right M2 occlusion. Advanced intracranial atherosclerosis including moderate bilateral ICA stenoses, a severe basilar artery stenosis, and severe bilateral proximal PCA stenoses.  CTP 40/144 cc, positive for penumbra MRI brain - right MCA large infarct with petechial hemorrhage MRA head - patent right M2 2D Echo EF 65-70% LDL - 100 HgbA1c 7.8 VTE prophylaxis -Lovenox No antithrombotics prior to admission, now on aspirin 325. Will consider eliquis early next week (10 days post stroke) Ongoing aggressive stroke risk factor management Therapy recommendations:  SNF, however palliative care  consulted on 09/22/20 due to ongoing dysphagia and difficulty managing secretions. The patients wife does not feel like a peg tube would be in accordance with his goals of  care. Palliative to see.  Disposition:  Pending  #Chronic Afib Was on Coumadin in the past Coumadin discontinued in 10/2019 due to GI bleeding Follow-up with cardiology, wife and pt declined watchman device  Now on heparin for treatment of atrial fibrillation, will consider AC with eliquis in 7 to 10 days post stroke (early next week)  #Hx of GIB Per discharge documentation on 9/21, he has a history of a GI Bleed that occurred in August of 2021 ( no source found on colonoscopy did show multiple submucosal polyps) followed by large volume hematochezia on 10/26/19. GI was consulted and he underwent colonoscopy on 10/29/19 which revealed large fecal impaction with underlying deep ulcers in the rectum (nonbleeding) consistent with stercoral ulcers. This was thought to be the cause of the bleeding. - At this time do not feel GI findings are a contraindication for anticoagulation   #Upper Airway Secretions  #Cough  Per nursing patient has had difficulty coughing up his secretions - Pulmonary toilet: Albuterol treatments Q4  + OOB to chair + Percussion vest  - CXR 09/22/20- No significant change since 09/12/2020. Low lung volumes with coarse chronic interstitial opacities  #Hypertension Pressures are normotensive with elevations to the 140 range. He is on metoprolol and doxazosin. Current blood pressure goal is normotension, he is at goal   #Hyperlipidemia LDL 100, goal < 70, started on Lipitor 40 mg QD this admission, will continue at discharge   #Post Stroke Dysphagia  He failed bedside swallow evaluation and also failed evaluation with ST who saw him for the first time on the 20th post extubation. They have not attempted an MBS yet and feel that the prognosis for swallow improvement is poor. The patients wife does not want him to have a peg tube as she feels he would not have wanted to live like that. Thus palliative care was consulted on 09/22/20.   #DMII  He has a history of diabetes and was on  Lantus + Novolog at home. Are treating hyperglycemia with SSI this admission. Hemoglobin A1c for stroke screening is 7.8, goal is < 7.  - Continue scheduled Novolog and sliding scale Novolog  - Follow up with PCP at discharge for management   Palliative Care Consult 09/23/20 SUMMARY OF RECOMMENDATIONS -Large, debilitating CVA- GOC are full comfort, no PEG -Symptom management- patient will require IV medications for symptom management due to his inability to take po and ongoing aspiration of his secretions- -Secretions- start Robinul 0.50mcg IV q4hr (has been ordered) -Pain- morphine 2mg  IV q6hr (has been ordered) -Other prn medications as ordered -D/C all interventions and medications that are not contributing to comfort (D/C'd) (Heparin D/C'd) -PMT will facilitate meeting with patient's spouse and Daughter tomorrow morning at 10 to relay care plan to daughter and assist with complicated family dynamics Disposition- TOC referral for inpatient Hospice facility placement (TOC following - Hospice following - no bed available today)  Other Active Problems Code status - Full code CKD - stage 3b: Trending creatinine  Chronic fractures related to fall - hip, pelvic and LEFT radius and ulna fxs  Mild leukocytosis: Trending  Elevated MCV - B12 elevated, folate 27.2     Patient is now full comfort care measures only and resting comfortably.  Anticipate transfer to hospice nursing facility in the next few days if condition does  not decline significantly.  Appreciate help from palliative care team.  No family at the bedside during my visit today. Delia Heady, MD  Hospital day # [phone]

## 2020-09-24 NOTE — Progress Notes (Signed)
Nutrition Brief Note  Chart reviewed. Pt now transitioning to comfort care.  No further nutrition interventions planned at this time.  Please re-consult as needed.   Arneisha Kincannon, MS, RD, LDN (she/her/hers) RD pager number and weekend/on-call pager number located in Amion.   

## 2020-09-24 NOTE — Plan of Care (Signed)
  Problem: Clinical Measurements: Goal: Quality of life will improve Outcome: Progressing   

## 2020-09-24 NOTE — Progress Notes (Signed)
Daily Progress Note   Patient Name: Andre Adams       Date: 09/24/2020 DOB: 04/03/1933  Age: 87 y.o. MRN#: [id number] Attending Physician: Stroke, Md, MD Primary Care Physician: Leola Brazil, DO Admit Date: 09/11/2020  Reason for Consultation/Follow-up: Establishing goals of care  Subjective: Patient appears comfortable. Does not arouse to my voice or light touch.  Steward Drone is at bedside. Daughter did not come for meeting. Steward Drone notes difficult relationship- however, daughter did not disagree when Steward Drone informed her about decision for Hospice.  Steward Drone voiced worries about her decisions. She is certain this is path that patient would prefer.  Encouraged her that as long as her decisions are patient centered- then she is making the right decision.   Review of Systems  Unable to perform ROS: Acuity of condition   Length of Stay: 13  Current Medications: Scheduled Meds:   Chlorhexidine Gluconate Cloth  6 each Topical Daily   glycopyrrolate  0.2 mg Intravenous Q6H    morphine injection  2 mg Intravenous Q6H   sodium chloride flush  3 mL Intravenous Once    Continuous Infusions:   PRN Meds: acetaminophen **OR** acetaminophen (TYLENOL) oral liquid 160 mg/5 mL **OR** acetaminophen, haloperidol **OR** haloperidol **OR** haloperidol lactate, LORazepam, ondansetron **OR** ondansetron (ZOFRAN) IV, polyvinyl alcohol  Physical Exam Vitals and nursing note reviewed.  Constitutional:      Comments: Frail, cachetic  Cardiovascular:     Rate and Rhythm: Normal rate. Rhythm irregular.  Pulmonary:     Effort: Pulmonary effort is normal.  Neurological:     Comments: unarouseable            Vital Signs: BP 121/82   Pulse (!) 107   Temp 97.9 F (36.6 C) (Oral)   Resp 20   Ht 5'  7" (1.702 m)   Wt 66.9 kg   SpO2 100%   BMI 23.10 kg/m  SpO2: SpO2: 100 % O2 Device: O2 Device: Room Air O2 Flow Rate: O2 Flow Rate (L/min): 4 L/min  Intake/output summary:  Intake/Output Summary (Last 24 hours) at 09/24/2020 1015 Last data filed at 09/23/2020 1629 Gross per 24 hour  Intake --  Output 600 ml  Net -600 ml   LBM: Last BM Date: 09/23/20 Baseline Weight: Weight: 68 kg Most recent weight: Weight: 66.9 kg  Palliative Assessment/Data: PPS: 10%      Patient Active Problem List   Diagnosis Date Noted   Acute respiratory failure with hypoxia (HCC)    Endotracheally intubated 09/12/2020   Acute CVA (cerebrovascular accident) (HCC)    AKI (acute kidney injury) (HCC)    Acute ischemic right MCA stroke (HCC) 09/11/2020   Chronic atrial fibrillation [facility])     Palliative Care Assessment & Plan   Patient Profile: 87 y.o. male  with past medical history of DM2, HTN, HL, a fib, GIB, frequent falls. admitted on 09/11/2020 with fall and L sided weakness. Workup revealed large infarct resulting in significant cognitive and physcal deficits. He was intubated 7/23-7/29. He is nonverbal, severe dysphagia, unable to control secretions, he does not show interest in po intake, continuously aspirating. Per discussion with attending and spouse- he would not want PEG. Palliative medicine consulted for GOC.   Assessment/Recommendations/Plan  Large CVA- comfort care only, referral made yesterday to [facility] for inpatient hospice- patient needs IV symptom management  Goals of Care and Additional Recommendations: Limitations on Scope of Treatment: Full Comfort Care  Code Status: DNR  Prognosis:  < 2 weeks  Discharge Planning: Hospice facility  Care plan was discussed with patient's spouse.   Thank you for allowing the Palliative Medicine Team to assist in the care of this patient.   Total time: 38 mins Greater than 50%  of this time was spent counseling  and coordinating care related to the above assessment and plan.  Ocie Bob, AGNP-C Palliative Medicine   Please contact Palliative Medicine Team phone at [phone] for questions and concerns.

## 2020-09-24 NOTE — Progress Notes (Addendum)
   Referral has been received from Transitions of care dept. Noted that PC will have meeting today at 1000am with daughter  to discuss  continuing with full comfort transition and help with family dynamics.  We will follow up after this conversation to discuss hospice services and possible transition to Hospice facility for EOL care.   Addendum:  0925am: After the bed meeting this am it is unfortunate but we are not able to offer a bed to this pt today. I will let family know we are at capacity and we will continue to follow until bed available. Norm Parcel RN [phone]

## 2020-09-25 MED ORDER — MORPHINE SULFATE (PF) 2 MG/ML IV SOLN
1.0000 mg | INTRAVENOUS | Status: DC | PRN
  Administered 2020-09-26: 2 mg via INTRAVENOUS
  Filled 2020-09-25: qty 1

## 2020-09-25 NOTE — Plan of Care (Signed)
  Problem: Nutrition: Goal: Risk of aspiration will decrease Outcome: Not Progressing   Problem: Activity: Goal: Risk for activity intolerance will decrease Outcome: Not Progressing   Problem: Coping: Goal: Level of anxiety will decrease Outcome: Progressing

## 2020-09-25 NOTE — Progress Notes (Signed)
STROKE TEAM PROGRESS NOTE   INTERVAL HISTORY Patient wife at the bedside. Pt unresponsive but not in distress. Palliative care on board, so far hospice no bed available. Continue comfort care measures. Wife has no questions.   Pertinent Imaging and Lab work   CT HEAD WO CONTRAST 09/12/2020 IMPRESSION: Hyperdensity over the right MCA territory, likely contrast staining.   09/11/20 CT HEAD CODE STROKE WO CONTRAST IMPRESSION: 1. No evidence of acute intracranial abnormality. 2. ASPECTS is 10. 3. Moderate chronic small vessel ischemic disease.   09/11/20 CT ANGIO HEAD NECK W WO CM W PERF  IMPRESSION: 1. Proximal right M2 occlusion. 2. CTP demonstrates a right MCA core infarct with large penumbra as detailed above. 3. Advanced intracranial atherosclerosis including moderate bilateral ICA stenoses, a severe basilar artery stenosis, and severe bilateral proximal PCA stenoses. 4. Cervical carotid atherosclerosis without significant stenosis. 5. Severe proximal left vertebral artery stenosis.   09/12/20 Echocardiogram   1. Left ventricular ejection fraction, by estimation, is 65 to 70%. The left ventricle has normal function. The left ventricle has no regional wall motion abnormalities. Left ventricular diastolic parameters are indeterminate.   2. RV-RA gradient normal at 23 mmHg. Right ventricular systolic function is normal. The right ventricular size is normal.   3. Left atrial size was severely dilated.   4. The mitral valve is degenerative. Mild mitral valve regurgitation.   5. The aortic valve is tricuspid. There is moderate calcification of the aortic valve. Aortic valve regurgitation is not visualized. Moderate aortic valve stenosis. Aortic valve mean gradient measures 11.0 mmHg. Dimenionless index 0.41.   6. Aortic dilatation noted. There is borderline dilatation of the aortic root, measuring 39 mm.   7. Unable to estimate CVP.  PHYSICAL EXAM Limited exam due to comfort care measures. No  respiratory distress. He did not open eyes on voice. His eyes slight open position, still has left proptosis. No spontaneous movement of all extremities.   ASSESSMENT/PLAN Andre Adams is a 87 y.o. male with history of DM2, CKD stage 3a, HTN, HL, hx a fib off AC 2/2 hx GIB, mild cognitive impairment and frequent falls who presented after a fall, non verbal and L sided weakness at ALF. He did not receive IV t-PA due to head trauma with fall. The pt was taken to IR for intervention for thrombectomy Rt M2 lesion.  #Stroke: Rt MCA large infarct due to right M2 occlusion status post IR, embolic, likely due to atrial fibrillation not on anticoagulation. CT Head - No evidence of acute intracranial abnormality.  CTA H&N - Proximal right M2 occlusion. Advanced intracranial atherosclerosis including moderate bilateral ICA stenoses, a severe basilar artery stenosis, and severe bilateral proximal PCA stenoses.  CTP 40/144 cc, positive for penumbra MRI brain - right MCA large infarct with petechial hemorrhage MRA head - patent right M2 2D Echo EF 65-70% LDL - 100 HgbA1c 7.8 VTE prophylaxis -Lovenox No antithrombotics prior to admission, was on aspirin 325. Now in comfort care Ongoing aggressive stroke risk factor management Therapy recommendations:  SNF, however palliative care consulted on 09/22/20 due to ongoing dysphagia and difficulty managing secretions. The patients wife does not feel like a peg tube would be in accordance with his goals of care. Palliative on board, now in full comfort care.  Disposition:  residential hospice once bed available  #Chronic Afib Was on Coumadin in the past Coumadin discontinued in 10/2019 due to GI bleeding Follow-up with cardiology, wife and pt declined watchman device  was on  heparin for treatment of atrial fibrillation and considered AC with eliquis later but now on comfort care measures  #Hx of GIB Per discharge documentation on 9/21, he has a history of a  GI Bleed that occurred in August of 2021 ( no source found on colonoscopy did show multiple submucosal polyps) followed by large volume hematochezia on 10/26/19. GI was consulted and he underwent colonoscopy on 10/29/19 which revealed large fecal impaction with underlying deep ulcers in the rectum (nonbleeding) consistent with stercoral ulcers. This was thought to be the cause of the bleeding. - Do not feel GI findings are a contraindication for anticoagulation  - however, pt now in comfort care measures  #Upper Airway Secretions  #Cough  Per nursing patient has had difficulty coughing up his secretions - Pulmonary toilet: Albuterol treatments Q4  + OOB to chair + Percussion vest  - CXR 09/22/20- No significant change since 09/12/2020. Low lung volumes with coarse chronic interstitial opacities - Now in comfort care  #Hypertension Pressures stable. He was on metoprolol and doxazosin   #Hyperlipidemia LDL 100, was started on Lipitor 40 mg QD this admission, now in comfort care   #Post Stroke Dysphagia  He failed bedside swallow evaluation and also failed evaluation with ST who saw him for the first time on the 20th post extubation. They have not attempted an MBS yet and feel that the prognosis for swallow improvement is poor. The patients wife does not want him to have a peg tube as she feels he would not have wanted to live like that. Thus palliative care was consulted on 09/22/20, and now in comfort care   #DMII  He has a history of diabetes and was on Lantus + Novolog at home. Are treating hyperglycemia with SSI this admission. Hemoglobin A1c for stroke screening is 7.8, uncontrolled   Other Active Problems Code status - DNR CKD - stage 3b   Chronic fractures related to fall - hip, pelvic and LEFT radius and ulna fxs  Elevated MCV - B12 elevated, folate 27.2   Hospital day # 14  Marvel Plan, MD PhD Stroke Neurology 09/25/2020 2:17 PM

## 2020-09-25 NOTE — Consult Note (Signed)
Spoke with Toniann Fail, SW and we are still unable to offer bed at [facility].  No beds available at this time.

## 2020-09-25 NOTE — TOC Progression Note (Signed)
Transition of Care [facility]) - Progression Note    Patient Details  Name: ERNAN RUNKLES MRN: [id number] Date of Birth: [birth year]  Transition of Care [facility]) CM/SW Contact  Bess Kinds, RN Phone Number: [phone] 09/25/2020, 8:45 AM  Clinical Narrative:     Sherron Monday with Pat at [facility]. No bed available right now. TOC following.   Expected Discharge Plan: Hospice Medical Facility Barriers to Discharge: Continued Medical Work up, Hospice Bed not available  Expected Discharge Plan and Services Expected Discharge Plan: Hospice Medical Facility     Post Acute Care Choice: Hospice Living arrangements for the past 2 months: Hospice Facility                                       Social Determinants of Health (SDOH) Interventions    Readmission Risk Interventions No flowsheet data found.

## 2020-09-25 NOTE — Progress Notes (Addendum)
   Palliative Medicine Inpatient Follow Up Note   HPI: 87 y.o. male  with past medical history of DM2, HTN, HL, a fib, GIB, frequent falls. admitted on 09/11/2020 with fall and L sided weakness. Workup revealed large infarct resulting in significant cognitive and physcal deficits. He was intubated 7/23-7/29. He is nonverbal, severe dysphagia, unable to control secretions, he does not show interest in po intake, continuously aspirating. Per discussion with attending and spouse- he would not want PEG. Palliative medicine consulted for [place].  Today's Discussion (09/25/2020):  *Please note that this is a verbal dictation therefore any spelling or grammatical errors are due to the "[employer]" system interpretation.  Chart reviewed.  I met with patients bedside RN, Ubaldo Glassing this morning. She shares with me that Adarsh is having an increase in pain as he flinches even with light touch. We reviewed the plan of liberalizing his pain medications to aid in better symptom control. Stellan is also awake and Ubaldo Glassing would like to know if he can eat, we reviewed that he can have comfort feedings.   Upon assessment, Karsen is able to open his eyes and communicate though verbally he is slow to respond. He appears to have generalized discomfort per facial expressions and clenching his fists.   Plan to liberalize morphine in addition to Q6H to add [facility] 1-2mg  PRNs. Ativan is helping with anxious symptoms.  Questions and concerns addressed   Objective Assessment: Vital Signs Vitals:   09/24/20 0434 09/25/20 0444  BP: 121/82 (!) 131/91  Pulse: (!) 107 (!) 112  Resp: 20 17  Temp: 97.9 F (36.6 C) 98 F (36.7 C)  SpO2: 100% 99%    Intake/Output Summary (Last 24 hours) at 09/25/2020 1108 Last data filed at 09/25/2020 0813 Gross per 24 hour  Intake 0 ml  Output 650 ml  Net -650 ml   Last Weight  Most recent update: 09/23/2020  3:47 AM    Weight  66.9 kg (147 lb 7.8 oz)            Gen:  Very frail elderly  M HEENT: Drymucous membranes CV: Irregular rate and rhythm,  PULM:  On RA ABD: soft/nontender, (+) bruising on abdomen EXT: Ecchymosis on all extremities Neuro: Somnolent oriented to self  SUMMARY OF RECOMMENDATIONS   DNAR/DNI  Comfort care   Will liberalize morphine PRN frequency  [facility] for inpatient hospice as of this morning there are no beds available  Ongoing PMT support  Time Spent: 25 Greater than 50% of the time was spent in counseling and coordination of care ______________________________________________________________________________________ [place] Team Team Cell Phone: [phone] Please utilize secure chat with additional questions, if there is no response within 30 minutes please call the above phone number  Palliative Medicine Team providers are available by phone from 7am to 7pm daily and can be reached through the team cell phone.  Should this patient require assistance outside of these hours, please call the patient's attending physician.

## 2020-09-26 MED ORDER — ONDANSETRON HCL 4 MG/2ML IJ SOLN: 4.0000 mg | Freq: Four times a day (QID) | INTRAMUSCULAR | 0 refills | Status: AC | PRN

## 2020-09-26 MED ORDER — ACETAMINOPHEN 160 MG/5ML PO SOLN: 650.0000 mg | ORAL | 0 refills | Status: DC | PRN

## 2020-09-26 MED ORDER — ONDANSETRON 4 MG PO TBDP: 4.0000 mg | ORAL_TABLET | Freq: Four times a day (QID) | ORAL | 0 refills | Status: DC | PRN

## 2020-09-26 MED ORDER — HALOPERIDOL LACTATE 5 MG/ML IJ SOLN: 0.5000 mg | INTRAMUSCULAR | Status: DC | PRN

## 2020-09-26 MED ORDER — LORAZEPAM 2 MG/ML IJ SOLN: 2.0000 mg | INTRAMUSCULAR | 0 refills | Status: AC | PRN

## 2020-09-26 MED ORDER — ACETAMINOPHEN 325 MG PO TABS: 650.0000 mg | ORAL_TABLET | ORAL | 0 refills | Status: AC | PRN

## 2020-09-26 MED ORDER — MORPHINE SULFATE (PF) 2 MG/ML IV SOLN: 2.0000 mg | Freq: Four times a day (QID) | INTRAVENOUS | 0 refills | Status: DC

## 2020-09-26 MED ORDER — POLYVINYL ALCOHOL 1.4 % OP SOLN: 1.0000 [drp] | Freq: Four times a day (QID) | OPHTHALMIC | 0 refills | Status: AC | PRN

## 2020-09-26 MED ORDER — MORPHINE SULFATE (PF) 2 MG/ML IV SOLN: 1.0000 mg | INTRAVENOUS | 0 refills | Status: AC | PRN

## 2020-09-26 MED ORDER — MORPHINE SULFATE (PF) 4 MG/ML IV SOLN
4.0000 mg | Freq: Once | INTRAVENOUS | Status: AC
  Administered 2020-09-26: 4 mg via INTRAVENOUS
  Filled 2020-09-26: qty 1

## 2020-09-26 MED ORDER — HALOPERIDOL 0.5 MG PO TABS: 0.5000 mg | ORAL_TABLET | ORAL | Status: DC | PRN

## 2020-09-26 MED ORDER — MORPHINE 100MG IN NS 100ML (1MG/ML) PREMIX INFUSION: 3.0000 mg/h | INTRAVENOUS | Status: DC

## 2020-09-26 MED ORDER — ACETAMINOPHEN 650 MG RE SUPP: 650.0000 mg | RECTAL | 0 refills | Status: AC | PRN

## 2020-09-26 MED ORDER — HALOPERIDOL LACTATE 2 MG/ML PO CONC: 0.5000 mg | ORAL | 0 refills | Status: AC | PRN

## 2020-09-26 MED ORDER — MORPHINE BOLUS VIA INFUSION
2.0000 mg | INTRAVENOUS | Status: DC | PRN
  Filled 2020-09-26: qty 2

## 2020-09-26 MED ORDER — GLYCOPYRROLATE 0.2 MG/ML IJ SOLN: 0.2000 mg | INTRAMUSCULAR | 3 refills | Status: AC | PRN

## 2020-09-26 NOTE — Discharge Summary (Signed)
Stroke Discharge Summary  Patient ID: Andre Adams   MRN: [id number]      DOB: 01-18-1934  Date of Admission: 09/11/2020 Date of Discharge: 09/26/2020  Attending Physician:  Stroke, Md, MD, Stroke MD Consultant(s):    pulmonary/intensive care and interventional radiology and palliative care medicine   Patient's PCP:  Leola Brazil, DO  DISCHARGE DIAGNOSIS:  Principal Problem:   Acute ischemic right MCA stroke [facility]) Active Problems:   Chronic atrial fibrillation (HCC)   Hx of GIB   AKI on CKD 3b   Acute respiratory failure with hypoxia (HCC)   HTN   HLD   DM   Dysphagia    Terminal care   Chronic left radius and ulnar fractures   Allergies as of 09/26/2020       Reactions   Baclofen Other (See Comments)   lethargy        Medication List     STOP taking these medications    ergocalciferol 1.25 MG (50000 UT) capsule Commonly known as: VITAMIN D2   folic acid 1 MG tablet Commonly known as: FOLVITE   Lantus SoloStar 100 UNIT/ML Solostar Pen Generic drug: insulin glargine   magnesium hydroxide 400 MG/5ML suspension Commonly known as: MILK OF MAGNESIA   metoprolol tartrate 50 MG tablet Commonly known as: LOPRESSOR   midodrine 5 MG tablet Commonly known as: PROAMATINE   NovoLOG FlexPen 100 UNIT/ML FlexPen Generic drug: insulin aspart   Ozempic (0.25 or 0.5 MG/DOSE) 2 MG/1.5ML Sopn Generic drug: Semaglutide(0.25 or 0.5MG /DOS)   pantoprazole 40 MG tablet Commonly known as: PROTONIX   polyethylene glycol 17 g packet Commonly known as: MIRALAX / GLYCOLAX   PRESCRIPTION MEDICATION   simethicone 125 MG chewable tablet Commonly known as: MYLICON   tamsulosin 0.4 MG Caps capsule Commonly known as: FLOMAX   vitamin B-12 1000 MCG tablet Commonly known as: CYANOCOBALAMIN   vitamin C 500 MG tablet Commonly known as: ASCORBIC ACID       TAKE these medications    acetaminophen 325 MG tablet Commonly known as: TYLENOL Take 2 tablets (650 mg  total) by mouth every 4 (four) hours as needed for mild pain (or temp > 37.5 C (99.5 F)). What changed:  medication strength how much to take when to take this reasons to take this   acetaminophen 650 MG suppository Commonly known as: TYLENOL Place 1 suppository (650 mg total) rectally every 4 (four) hours as needed for mild pain (or temp > 37.5 C (99.5 F)). What changed: You were already taking a medication with the same name, and this prescription was added. Make sure you understand how and when to take each.   glycopyrrolate 0.2 MG/ML injection Commonly known as: ROBINUL Inject 1 mL (0.2 mg total) into the vein every 4 (four) hours as needed.   haloperidol 2 MG/ML solution Commonly known as: HALDOL Place 0.3 mLs (0.6 mg total) under the tongue every 4 (four) hours as needed for agitation (or delirium).   LORazepam 2 MG/ML injection Commonly known as: ATIVAN Inject 1 mL (2 mg total) into the vein every 4 (four) hours as needed for anxiety.   morphine 2 MG/ML injection Inject 0.5-1 mLs (1-2 mg total) into the vein every hour as needed (Dyspnea/Pain).   ondansetron 4 MG/2ML Soln injection Commonly known as: ZOFRAN Inject 2 mLs (4 mg total) into the vein every 6 (six) hours as needed for nausea.   polyvinyl alcohol 1.4 % ophthalmic solution Commonly known as:  LIQUIFILM TEARS Place 1 drop into both eyes 4 (four) times daily as needed for dry eyes. What changed:  how much to take when to take this reasons to take this        LABORATORY STUDIES CBC    Component Value Date/Time   WBC 12.0 (H) 09/23/2020 0030   RBC 3.97 (L) 09/23/2020 0030   HGB 12.8 (L) 09/23/2020 0030   HCT 39.6 09/23/2020 0030   PLT 247 09/23/2020 0030   MCV 99.7 09/23/2020 0030   MCH 32.2 09/23/2020 0030   MCHC 32.3 09/23/2020 0030   RDW 12.5 09/23/2020 0030   CMP    Component Value Date/Time   NA 141 09/21/2020 0345   K 4.1 09/21/2020 0345   CL 106 09/21/2020 0345   CO2 24 09/21/2020 0345    GLUCOSE 240 (H) 09/21/2020 0345   BUN 57 (H) 09/21/2020 0345   CREATININE 1.88 (H) 09/21/2020 0345   CALCIUM 8.7 (L) 09/21/2020 0345   PROT 6.3 (L) 09/11/2020 1800   ALBUMIN 2.4 (L) 09/17/2020 0200   AST 18 09/11/2020 1800   ALT 10 09/11/2020 1800   ALKPHOS 87 09/11/2020 1800   BILITOT 0.9 09/11/2020 1800   GFRNONAA 35 (L) 09/21/2020 0345   COAGS Lab Results  Component Value Date   INR 1.1 09/11/2020   Lipid Panel    Component Value Date/Time   CHOL 147 09/12/2020 0417   TRIG 94 09/16/2020 0750   HDL 27 (L) 09/12/2020 0417   CHOLHDL 5.4 09/12/2020 0417   VLDL 20 09/12/2020 0417   LDLCALC 100 (H) 09/12/2020 0417   HgbA1C  Lab Results  Component Value Date   HGBA1C 7.8 (H) 09/12/2020   Urinalysis    Component Value Date/Time   COLORURINE STRAW (A) 09/11/2020 2250   APPEARANCEUR CLEAR 09/11/2020 2250   LABSPEC 1.023 09/11/2020 2250   PHURINE 6.0 09/11/2020 2250   GLUCOSEU >=500 (A) 09/11/2020 2250   HGBUR SMALL (A) 09/11/2020 2250   BILIRUBINUR NEGATIVE 09/11/2020 2250   KETONESUR NEGATIVE 09/11/2020 2250   PROTEINUR 100 (A) 09/11/2020 2250   NITRITE NEGATIVE 09/11/2020 2250   LEUKOCYTESUR SMALL (A) 09/11/2020 2250   Urine Drug Screen No results found for: LABOPIA, COCAINSCRNUR, LABBENZ, AMPHETMU, THCU, LABBARB  Alcohol Level    Component Value Date/Time   ETH <10 09/11/2020 1845     SIGNIFICANT DIAGNOSTIC STUDIES DG Wrist Complete Left  Result Date: 09/11/2020 CLINICAL DATA:  fall EXAM: LEFT HAND - COMPLETE 3+ VIEW; LEFT WRIST - COMPLETE 3+ VIEW COMPARISON:  December 15, 2019 common March 13, 2020 FINDINGS: Revisualization of the sequela of an impacted fracture of the distal LEFT radius and ulna. There is mature osseous bridging with scattered residual areas of lucency from prior fracture sites. Fracture fragments are in unchanged alignment. No definitive acute fracture. Vascular calcifications. Osteopenia. Degenerative changes throughout the DIPs and PIPs.  IMPRESSION: Revisualization of sequela of prior impacted fracture of the distal radius and ulna. Evaluation for superimposed acute fracture is limited due to osteopenia and underlying chronic osseous remodeling. No definitive superimposed acute fracture is noted. If persistent clinical concern for scaphoid fracture, recommend immobilization and follow-up radiographs in 2 weeks versus MRI. Electronically Signed   By: Meda Klinefelter MD   On: 09/11/2020 19:17   DG Abd 1 View  Result Date: 09/14/2020 CLINICAL DATA:  Feeding tube placement EXAM: ABDOMEN - 1 VIEW fluoro time: FLUOROSCOPY TIME:  1 minute, 12 seconds; 6.8 mGy COMPARISON:  None. FINDINGS: Enteric tube overlies the  left upper quadrant. Injected air is within the stomach lumen confirming placement. IMPRESSION: Enteric tube within the stomach. Electronically Signed   By: Guadlupe Spanish M.D.   On: 09/14/2020 18:56   CT Head Wo Contrast  Result Date: 09/21/2020 CLINICAL DATA:  Stroke follow-up EXAM: CT HEAD WITHOUT CONTRAST TECHNIQUE: Contiguous axial images were obtained from the base of the skull through the vertex without intravenous contrast. COMPARISON:  09/12/2020 FINDINGS: Brain: Hypodensity throughout the right MCA territory. Multifocal mild subarachnoid hyperattenuation, likely indicating post ischemic contrast staining. Mild mass effect on the right lateral ventricle. 3 mm of leftward midline shift. Vascular: Atherosclerotic calcification of the vertebral and internal carotid arteries at the skull base. No abnormal hyperdensity of the major intracranial arteries or dural venous sinuses. Skull: The visualized skull base, calvarium and extracranial soft tissues are normal. Sinuses/Orbits: No fluid levels or advanced mucosal thickening of the visualized paranasal sinuses. No mastoid or middle ear effusion. The orbits are normal. IMPRESSION: 1. Expected evolution of right MCA territory infarct with multifocal mild subarachnoid hyperattenuation,  likely indicating post ischemic contrast staining. 2. 3 mm of leftward midline shift. Electronically Signed   By: Deatra Robinson M.D.   On: 09/21/2020 03:43   CT HEAD WO CONTRAST  Result Date: 09/12/2020 CLINICAL DATA:  Status post thrombectomy EXAM: CT HEAD WITHOUT CONTRAST TECHNIQUE: Contiguous axial images were obtained from the base of the skull through the vertex without intravenous contrast. COMPARISON:  None. FINDINGS: Brain: There is hyperdensity over the right MCA territory, likely contrast staining. There is generalized atrophy without lobar predilection. There is periventricular hypoattenuation compatible with chronic microvascular disease. Vascular: No abnormal hyperdensity of the major intracranial arteries or dural venous sinuses. No intracranial atherosclerosis. Skull: The visualized skull base, calvarium and extracranial soft tissues are normal. Sinuses/Orbits: No fluid levels or advanced mucosal thickening of the visualized paranasal sinuses. No mastoid or middle ear effusion. The orbits are normal. IMPRESSION: Hyperdensity over the right MCA territory, likely contrast staining. Electronically Signed   By: Deatra Robinson M.D.   On: 09/12/2020 02:55   CT Cervical Spine Wo Contrast  Result Date: 09/11/2020 CLINICAL DATA:  Facial trauma. EXAM: CT CERVICAL SPINE WITHOUT CONTRAST TECHNIQUE: Multidetector CT imaging of the cervical spine was performed without intravenous contrast. Multiplanar CT image reconstructions were also generated. COMPARISON:  None. FINDINGS: Alignment: No evidence of acute traumatic subluxation. Skull base and vertebrae: No acute cervical spine fracture or suspicious osseous lesion. Mild superior endplate compression fracture at T3, likely chronic. Soft tissues and spinal canal: No prevertebral fluid or swelling. No visible canal hematoma. Disc levels: Moderate to severe disc space narrowing from C4-5 to C6-7. Severe bilateral facet arthrosis at C3-4 with small subchondral  cysts or erosions and joint widening on the right. Right facet ankylosis at C4-5. No evidence of high-grade spinal stenosis or high-grade neural foraminal stenosis. Upper chest: No apical lung consolidation or mass. Other: Subcentimeter thyroid nodules for which no imaging follow-up is recommended. Carotid atherosclerosis. IMPRESSION: 1. No acute cervical spine fracture. 2. Advanced cervical disc and facet degeneration. Electronically Signed   By: Sebastian Ache M.D.   On: 09/11/2020 19:01   MR ANGIO HEAD WO CONTRAST  Result Date: 09/12/2020 CLINICAL DATA:  Neuro deficit, acute, stroke suspected; Stroke, follow up. Status post right MCA thrombectomy. EXAM: MRI HEAD WITHOUT CONTRAST MRA HEAD WITHOUT CONTRAST TECHNIQUE: Multiplanar, multi-echo pulse sequences of the brain and surrounding structures were acquired without intravenous contrast. Angiographic images of the Circle of Willis were acquired  using MRA technique without intravenous contrast. COMPARISON:  Head CT 09/12/2020. Head and neck CTA and CTP 09/11/2020. FINDINGS: MRI HEAD FINDINGS Brain: There is a moderately large acute right MCA territory infarct involving much of the lateral temporal lobe, insula, and small portions of the frontal and parietal lobes including the operculum. There is associated petechial hemorrhage. There is mild cytotoxic edema without significant mass effect. T2 hyperintensities elsewhere in the cerebral white matter bilaterally are nonspecific but compatible with moderate chronic small vessel ischemic disease. There are small chronic infarcts in the left basal ganglia, left thalamus, and right greater than left cerebellum. There are chronic blood products associated with the left basal ganglia infarct, and there are also a few scattered chronic microhemorrhages in the cerebrum and as well as one in the cerebellum. Mild chronic small vessel changes are noted in the pons. There is mild to moderate cerebral atrophy. No mass, midline  shift, or extra-axial fluid collection is identified. Vascular: Arteries more fully evaluated below. Suspected slow flow in the right transverse and sigmoid sinuses. Skull and upper cervical spine: Unremarkable bone marrow signal. Sinuses/Orbits: Bilateral cataract extraction. Paranasal sinuses and mastoid air cells are clear. Other: None. MRA HEAD FINDINGS The study is moderately motion degraded. Anterior circulation: The internal carotid arteries are patent from skull base to carotid termini with detailed assessment of bilateral paraclinoid stenoses limited by motion. The ACAs are patent with a mild-to-moderate a left A1 stenosis again noted. The M1 segments are patent with motion artifact limiting detailed assessment of the distal right M1 segment and of bilateral MCA branch vessels, however the revascularized right M2 superior division remains grossly patent. No aneurysm is identified. Posterior circulation: The visualized distal vertebral arteries are patent to the basilar with the left being mildly dominant. The basilar artery is patent with a mild stenosis in its midportion, less severe than the appearance on yesterday's CTA. The PCAs are patent with a severe stenosis again noted near the left P1-P2 junction. There is only evidence of mild narrowing near the right P1-P2 junction in contrast to the appearance severe stenosis on yesterday's CTA. No aneurysm is identified. Anatomic variants: None. IMPRESSION: 1. Moderately large acute right MCA territory infarct with petechial hemorrhage. 2. Moderate chronic small vessel ischemic disease with chronic lacunar infarcts as above. 3. Motion degraded head MRA as detailed above. The revascularized right M2 division remains grossly patent. Electronically Signed   By: Sebastian Ache M.D.   On: 09/12/2020 15:00   MR BRAIN WO CONTRAST  Result Date: 09/12/2020 CLINICAL DATA:  Neuro deficit, acute, stroke suspected; Stroke, follow up. Status post right MCA thrombectomy.  EXAM: MRI HEAD WITHOUT CONTRAST MRA HEAD WITHOUT CONTRAST TECHNIQUE: Multiplanar, multi-echo pulse sequences of the brain and surrounding structures were acquired without intravenous contrast. Angiographic images of the Circle of Willis were acquired using MRA technique without intravenous contrast. COMPARISON:  Head CT 09/12/2020. Head and neck CTA and CTP 09/11/2020. FINDINGS: MRI HEAD FINDINGS Brain: There is a moderately large acute right MCA territory infarct involving much of the lateral temporal lobe, insula, and small portions of the frontal and parietal lobes including the operculum. There is associated petechial hemorrhage. There is mild cytotoxic edema without significant mass effect. T2 hyperintensities elsewhere in the cerebral white matter bilaterally are nonspecific but compatible with moderate chronic small vessel ischemic disease. There are small chronic infarcts in the left basal ganglia, left thalamus, and right greater than left cerebellum. There are chronic blood products associated with the left basal  ganglia infarct, and there are also a few scattered chronic microhemorrhages in the cerebrum and as well as one in the cerebellum. Mild chronic small vessel changes are noted in the pons. There is mild to moderate cerebral atrophy. No mass, midline shift, or extra-axial fluid collection is identified. Vascular: Arteries more fully evaluated below. Suspected slow flow in the right transverse and sigmoid sinuses. Skull and upper cervical spine: Unremarkable bone marrow signal. Sinuses/Orbits: Bilateral cataract extraction. Paranasal sinuses and mastoid air cells are clear. Other: None. MRA HEAD FINDINGS The study is moderately motion degraded. Anterior circulation: The internal carotid arteries are patent from skull base to carotid termini with detailed assessment of bilateral paraclinoid stenoses limited by motion. The ACAs are patent with a mild-to-moderate a left A1 stenosis again noted. The M1  segments are patent with motion artifact limiting detailed assessment of the distal right M1 segment and of bilateral MCA branch vessels, however the revascularized right M2 superior division remains grossly patent. No aneurysm is identified. Posterior circulation: The visualized distal vertebral arteries are patent to the basilar with the left being mildly dominant. The basilar artery is patent with a mild stenosis in its midportion, less severe than the appearance on yesterday's CTA. The PCAs are patent with a severe stenosis again noted near the left P1-P2 junction. There is only evidence of mild narrowing near the right P1-P2 junction in contrast to the appearance severe stenosis on yesterday's CTA. No aneurysm is identified. Anatomic variants: None. IMPRESSION: 1. Moderately large acute right MCA territory infarct with petechial hemorrhage. 2. Moderate chronic small vessel ischemic disease with chronic lacunar infarcts as above. 3. Motion degraded head MRA as detailed above. The revascularized right M2 division remains grossly patent. Electronically Signed   By: Sebastian Ache M.D.   On: 09/12/2020 15:00   DG Pelvis Portable  Result Date: 09/11/2020 CLINICAL DATA:  trauma EXAM: PORTABLE PELVIS 1-2 VIEWS COMPARISON:  December 15, 2019 FINDINGS: Osteopenia. Excreted contrast limits evaluation of the sacrum. No acute displaced fracture seen on single view. No pelvic diastasis. Vascular calcifications. Degenerative changes of the lumbar spine. IMPRESSION: No acute fracture on single view. If persistent concern for nondisplaced hip or pelvic fracture, recommend dedicated pelvic MRI. Electronically Signed   By: Meda Klinefelter MD   On: 09/11/2020 19:13   DG Chest Port 1 View  Result Date: 09/22/2020 CLINICAL DATA:  Congestion wheezing EXAM: PORTABLE CHEST 1 VIEW COMPARISON:  09/12/2020 FINDINGS: Esophageal tube tip overlies the stomach. Low lung volumes. Borderline cardiomegaly with aortic atherosclerosis.  Coarse chronic interstitial opacity without acute airspace disease, pleural effusion or pneumothorax. IMPRESSION: No significant change since 09/12/2020. Low lung volumes with coarse chronic interstitial opacities. Electronically Signed   By: Jasmine Pang M.D.   On: 09/22/2020 17:08   DG CHEST PORT 1 VIEW  Result Date: 09/12/2020 CLINICAL DATA:  Code stroke. EXAM: PORTABLE CHEST 1 VIEW COMPARISON:  09/11/2020 FINDINGS: 0629 hours. Endotracheal tube tip is 5.4 cm above the base of the carina. NG tube tip is in the distal esophagus with proximal side port in the mid esophagus. This tube could be advanced approximately 12-13 cm to place the proximal side port below the GE junction. Interstitial markings are diffusely coarsened with chronic features. Interval decrease in retrocardiac left base atelectasis. Bones are diffusely demineralized. Telemetry leads overlie the chest. IMPRESSION: 1. Endotracheal tube tip is 5.4 cm above the base of the carina. 2. NG tube tip is in the distal esophagus, see above. 3. Persistent chronic interstitial  coarsening with interval improvement in retrocardiac left base atelectasis. Electronically Signed   By: Kennith CenterEric  Mansell M.D.   On: 09/12/2020 08:59   Portable Chest x-ray  Result Date: 09/11/2020 CLINICAL DATA:  87 year old male status post intubation. EXAM: PORTABLE CHEST 1 VIEW COMPARISON:  Earlier radiograph dated 09/11/2020. FINDINGS: Interval placement of an endotracheal tube with tip approximately 6 cm above the carina. Enteric tube with side-port in the distal esophagus and tip close to the GE junction. Recommend further advancing of the enteric tube by at least additional 10 cm. Diffuse interstitial coarsening and left lung base atelectasis similar to prior radiograph. Stable cardiomediastinal silhouette. Atherosclerotic calcification of the aorta. No acute osseous pathology. IMPRESSION: 1. Endotracheal tube above the carina. 2. Enteric tube with side-port in the distal  esophagus and tip close to the GE junction. Recommend further advancing of the enteric tube by at least 10 cm. Electronically Signed   By: Elgie CollardArash  Radparvar M.D.   On: 09/11/2020 23:08   DG Chest Portable 1 View  Result Date: 09/11/2020 CLINICAL DATA:  trauma EXAM: PORTABLE CHEST 1 VIEW COMPARISON:  October 23, 2019, October 03, 2019 FINDINGS: Evaluation is limited secondary to patient rotation. The cardiomediastinal silhouette is grossly unchanged in contour. No pleural effusion. No pneumothorax. Diffuse interstitial prominence. Visualized abdomen is unremarkable. Compression fracture deformity of the T11, unchanged in comparison to prior. Compression fracture of T9 appears similar comparison to prior. IMPRESSION: Diffuse interstitial prominence likely reflecting underlying pulmonary edema. Differential considerations include atypical infection. Electronically Signed   By: Meda KlinefelterStephanie  Peacock MD   On: 09/11/2020 19:12   DG Abd Portable 1V  Result Date: 09/15/2020 CLINICAL DATA:  Feeding tube placement EXAM: PORTABLE ABDOMEN - 1 VIEW COMPARISON:  None. FINDINGS: Feeding tube tip in the mid stomach. Nonobstructive bowel gas pattern. IMPRESSION: Feeding tube tip in the mid stomach. Electronically Signed   By: Charlett NoseKevin  Dover M.D.   On: 09/15/2020 15:51   DG Abd Portable 1V  Result Date: 09/12/2020 CLINICAL DATA:  Enteric tube placement EXAM: PORTABLE ABDOMEN - 1 VIEW COMPARISON:  September 12, 2020 FINDINGS: Incomplete assessment of majority of the abdomen. Enteric tube tip and side port project over the stomach. Scattered linear opacities, likely atelectasis. Coarse reticular opacities unchanged. Atherosclerotic calcifications of the aorta. Unchanged cardiomediastinal silhouette. ETT tip terminates 2.6 cm above the carina. Chronic compression fracture deformity at the thoracolumbar junction. IMPRESSION: Enteric tube tip and side port project over the stomach. Electronically Signed   By: Meda KlinefelterStephanie  Peacock MD   On:  09/12/2020 14:39   DG Hand Complete Left  Result Date: 09/11/2020 CLINICAL DATA:  fall EXAM: LEFT HAND - COMPLETE 3+ VIEW; LEFT WRIST - COMPLETE 3+ VIEW COMPARISON:  December 15, 2019 common March 13, 2020 FINDINGS: Revisualization of the sequela of an impacted fracture of the distal LEFT radius and ulna. There is mature osseous bridging with scattered residual areas of lucency from prior fracture sites. Fracture fragments are in unchanged alignment. No definitive acute fracture. Vascular calcifications. Osteopenia. Degenerative changes throughout the DIPs and PIPs. IMPRESSION: Revisualization of sequela of prior impacted fracture of the distal radius and ulna. Evaluation for superimposed acute fracture is limited due to osteopenia and underlying chronic osseous remodeling. No definitive superimposed acute fracture is noted. If persistent clinical concern for scaphoid fracture, recommend immobilization and follow-up radiographs in 2 weeks versus MRI. Electronically Signed   By: Meda KlinefelterStephanie  Peacock MD   On: 09/11/2020 19:17   EEG adult  Result Date: 09/12/2020 Rejeana BrockKirkpatrick, McNeill P,  MD     09/12/2020  7:33 PM History: R sided twitching Sedation: none Technique: This is a 21 channel routine scalp EEG performed at the bedside with bipolar and monopolar montages arranged in accordance to the international 10/20 system of electrode placement. One channel was dedicated to EKG recording. Background: There is a posterior.rhythm seen at times with a frequency of 8 to 9 Hz.  In addition, there is diffuse alpha and beta range activities consistent with patient's sedated state.  There is diffuse irregular delta and theta which is also consistent with patient's sedation. There are sharply contoured theta and alpha range activity over the temporal lobes which could be consistent with that with the rhythm, but there are also intermittent sharply contoured waves which appear to disrupt the background consistent with sharp  waves.  These are maximal at  P7, 01, P3. Photic stimulation: Physiologic driving is not performed EEG Abnormalities: 1) Left posterior quadrant epileptiform discharges Clinical Interpretation: This EEG is consistent with a left posterior quadrant potential area of epileptogenicity. There was no seizure recorded on this study. Ritta Slot, MD [facility] [phone] If 7pm- 7am, please page neurology on call as listed in AMION.   Overnight EEG with video  Result Date: 09/13/2020 Charlsie Quest, MD     09/13/2020 10:24 AM Patient Name: Andre Adams MRN: [id number] Epilepsy Attending: Charlsie Quest Referring Physician/Provider: Dr Marvel Plan Duration: 09/12/2020 1702 to 09/13/2020 1022 Patient history: 87 year old male with right MCA stroke noted to have right-sided twitching.  EEG to evaluate for seizures. Level of alertness: Awake, asleep AEDs during EEG study: LEV, propofol Technical aspects: This EEG study was done with scalp electrodes positioned according to the 10-20 International system of electrode placement. Electrical activity was acquired at a sampling rate of  and reviewed with a high frequency filter of  and a low frequency filter of . EEG data were recorded continuously and digitally stored. Description: The posterior dominant rhythm consists of 8-9Hz  activity of moderate voltage (25-35 uV) seen predominantly in posterior head regions, symmetric and reactive to eye opening and eye closing. Sleep was characterized by vertex waves, sleep spindles (12 to 14 Hz), maximal frontocentral region.  EEG also showed continuous 3 to 5 Hz theta-delta slowing in right frontotemporal region which at times appeared sharply contoured with triphasic morphology, predominantly when patient is awake/stimulated.  Hyperventilation and photic stimulation were not performed.   ABNORMALITY - Continuous slow, right frontotemporal region IMPRESSION: This study is suggestive of cortical  dysfunction in right frontotemporal region likely secondary to underlying infarct.  No seizures or definite epileptiform discharges were seen throughout the recording. Charlsie Quest   ECHOCARDIOGRAM COMPLETE  Result Date: 09/12/2020    ECHOCARDIOGRAM REPORT   Patient Name:   Andre Adams Date of Exam: 09/12/2020 Medical Rec #:  [id number]        Height:       67.0 in Accession #:    [id number]       Weight:       150.0 lb Date of Birth:  [birth year]       BSA:          1.790 m Patient Age:    86 years         BP:           111/65 mmHg Patient Gender: M                HR:  81 bpm. Exam Location:  Inpatient Procedure: 2D Echo, Cardiac Doppler and Color Doppler Indications:    Stroke  History:        Patient has no prior history of Echocardiogram examinations.  Sonographer:    Eulah Pont RDCS Referring Phys: [id number] Malachi Carl STACK IMPRESSIONS  1. Left ventricular ejection fraction, by estimation, is 65 to 70%. The left ventricle has normal function. The left ventricle has no regional wall motion abnormalities. Left ventricular diastolic parameters are indeterminate.  2. RV-RA gradient normal at 23 mmHg. Right ventricular systolic function is normal. The right ventricular size is normal.  3. Left atrial size was severely dilated.  4. The mitral valve is degenerative. Mild mitral valve regurgitation.  5. The aortic valve is tricuspid. There is moderate calcification of the aortic valve. Aortic valve regurgitation is not visualized. Moderate aortic valve stenosis. Aortic valve mean gradient measures 11.0 mmHg. Dimenionless index 0.41.  6. Aortic dilatation noted. There is borderline dilatation of the aortic root, measuring 39 mm.  7. Unable to estimate CVP. FINDINGS  Left Ventricle: Left ventricular ejection fraction, by estimation, is 65 to 70%. The left ventricle has normal function. The left ventricle has no regional wall motion abnormalities. The left ventricular internal cavity size was normal  in size. There is  borderline left ventricular hypertrophy. Left ventricular diastolic parameters are indeterminate. Right Ventricle: RV-RA gradient normal at 23 mmHg. The right ventricular size is normal. No increase in right ventricular wall thickness. Right ventricular systolic function is normal. Left Atrium: Left atrial size was severely dilated. Right Atrium: Right atrial size was normal in size. Pericardium: There is no evidence of pericardial effusion. Mitral Valve: The mitral valve is degenerative in appearance. There is mild thickening of the mitral valve leaflet(s). There is mild calcification of the mitral valve leaflet(s). Mild mitral annular calcification. Mild mitral valve regurgitation. Tricuspid Valve: The tricuspid valve is grossly normal. Tricuspid valve regurgitation is mild. Aortic Valve: The aortic valve is tricuspid. There is moderate calcification of the aortic valve. There is mild aortic valve annular calcification. Aortic valve regurgitation is not visualized. Moderate aortic stenosis is present. Aortic valve mean gradient measures 11.0 mmHg. Aortic valve peak gradient measures 16.9 mmHg. Aortic valve area, by VTI measures 1.42 cm. Pulmonic Valve: The pulmonic valve was grossly normal. Pulmonic valve regurgitation is mild. Aorta: Aortic dilatation noted. There is borderline dilatation of the aortic root, measuring 39 mm. Venous: Unable to estimate CVP. The inferior vena cava was not well visualized. IAS/Shunts: No atrial level shunt detected by color flow Doppler.  LEFT VENTRICLE PLAX 2D LVIDd:         3.20 cm LVIDs:         2.60 cm LV PW:         1.00 cm LV IVS:        1.00 cm LVOT diam:     2.10 cm LV SV:         54 LV SV Index:   30 LVOT Area:     3.46 cm  RIGHT VENTRICLE TAPSE (M-mode): 1.5 cm LEFT ATRIUM             Index       RIGHT ATRIUM           Index LA diam:        3.90 cm 2.18 cm/m  RA Area:     11.90 cm LA Vol (A2C):   82.1 ml 45.88 ml/m RA Volume:   25.10 ml  14.03 ml/m  LA Vol (A4C):   87.0 ml 48.62 ml/m LA Biplane Vol: 89.1 ml 49.79 ml/m  AORTIC VALVE AV Area (Vmax):    1.34 cm AV Area (Vmean):   1.30 cm AV Area (VTI):     1.42 cm AV Vmax:           205.50 cm/s AV Vmean:          143.750 cm/s AV VTI:            0.383 m AV Peak Grad:      16.9 mmHg AV Mean Grad:      11.0 mmHg LVOT Vmax:         79.43 cm/s LVOT Vmean:        53.833 cm/s LVOT VTI:          0.157 m LVOT/AV VTI ratio: 0.41  AORTA Ao Root diam: 3.90 cm Ao Asc diam:  3.40 cm TRICUSPID VALVE TR Peak grad:   23.2 mmHg TR Vmax:        241.00 cm/s  SHUNTS Systemic VTI:  0.16 m Systemic Diam: 2.10 cm Nona Dell MD Electronically signed by Nona Dell MD Signature Date/Time: 09/12/2020/3:37:07 PM    Final    CT HEAD CODE STROKE WO CONTRAST  Result Date: 09/11/2020 CLINICAL DATA:  Code stroke. Neuro deficit, acute, stroke suspected. EXAM: CT HEAD WITHOUT CONTRAST TECHNIQUE: Contiguous axial images were obtained from the base of the skull through the vertex without intravenous contrast. COMPARISON:  09/27/2019 FINDINGS: Brain: There is no evidence of an acute infarct, intracranial hemorrhage, mass, midline shift, or extra-axial fluid collection. There is mild-to-moderate cerebral atrophy. Patchy hypodensities in the cerebral white matter bilaterally are similar to the prior motion degraded CT and are nonspecific but compatible with moderate chronic small vessel ischemic disease. A chronic infarct is again noted in the left basal ganglia. There is also a small chronic right cerebellar infarct. Vascular: Calcified atherosclerosis at the skull base. No hyperdense vessel. Skull: No fracture or suspicious osseous lesion. Sinuses/Orbits: Paranasal sinuses and mastoid air cells are clear. Bilateral cataract extraction. Other: None. ASPECTS [facility] Stroke Program Early CT Score) - Ganglionic level infarction (caudate, lentiform nuclei, internal capsule, insula, M1-M3 cortex): 7 - Supraganglionic infarction (M4-M6  cortex): 3 Total score (0-10 with 10 being normal): 10 IMPRESSION: 1. No evidence of acute intracranial abnormality. 2. ASPECTS is 10. 3. Moderate chronic small vessel ischemic disease. These results were communicated to Dr. Selina Cooley at 6:21 pm on 09/11/2020 by text page via the [facility] messaging system. Electronically Signed   By: Sebastian Ache M.D.   On: 09/11/2020 18:23   CT ANGIO HEAD NECK W WO CM W PERF (CODE STROKE)  Result Date: 09/11/2020 CLINICAL DATA:  Neuro deficit, acute, stroke suspected. EXAM: CT ANGIOGRAPHY HEAD AND NECK CT PERFUSION BRAIN TECHNIQUE: Multidetector CT imaging of the head and neck was performed using the standard protocol during bolus administration of intravenous contrast. Multiplanar CT image reconstructions and MIPs were obtained to evaluate the vascular anatomy. Carotid stenosis measurements (when applicable) are obtained utilizing NASCET criteria, using the distal internal carotid diameter as the denominator. Multiphase CT imaging of the brain was performed following IV bolus contrast injection. Subsequent parametric perfusion maps were calculated using RAPID software. CONTRAST:  40mL OMNIPAQUE IOHEXOL 350 MG/ML SOLN COMPARISON:  None. FINDINGS: CTA NECK FINDINGS Aortic arch: Normal variant aortic arch branching pattern with common origin of the brachiocephalic and left common carotid arteries. Mild atherosclerotic plaque without arch vessel origin stenosis.  Right carotid system: Patent with predominantly calcified plaque at the carotid bifurcation. No evidence of a significant stenosis or dissection. Left carotid system: Patent with mixed calcified and soft plaque at the carotid bifurcation. No evidence of a significant stenosis or dissection. Vertebral arteries: Patent with the left being mildly dominant. Scattered atherosclerosis bilaterally resulting in severe proximal V1 and mild V3 stenoses on the left. Skeleton: See separate cervical spine CT report. Other neck: Subcentimeter  thyroid nodules for which no imaging follow-up is recommended. Upper chest: Mild motion artifact. No apical lung consolidation or mass. Review of the MIP images confirms the above findings CTA HEAD FINDINGS Anterior circulation: The internal carotid arteries are patent from skull base to carotid termini with atherosclerotic plaque resulting in moderate bilateral paraclinoid stenoses. The right M1 segment is patent, however there is a proximal M2 occlusion without significant distal reconstitution. The left MCA is patent with a mild M1 stenosis noted. The ACAs are patent with mild-to-moderate stenosis of the left A1 segment as well as moderate irregular narrowing of both A2 segments. No aneurysm is identified. Posterior circulation: The intracranial vertebral arteries are patent to the basilar with atherosclerosis resulting in irregularity bilaterally and a moderate stenosis on the right. The basilar artery is patent with a severe stenosis in its midportion. The right PCA is patent with a severe stenosis near the P1-P2 junction. There are severe stenoses of both PCAs near the P1-P2 junctions with poor flow distally, particularly on the left. No aneurysm is identified. Venous sinuses: As permitted by contrast timing, patent. Anatomic variants: None. Review of the MIP images confirms the above findings CT Brain Perfusion Findings: ASPECTS: 10 CBF (<30%) Volume: 40 mL Perfusion (Tmax>6.0s) volume: 184 mL Mismatch Volume: 144 mL Infarction Location: Right frontal and temporal lobes primarily at the level of the operculum (MCA territory) IMPRESSION: 1. Proximal right M2 occlusion. 2. CTP demonstrates a right MCA core infarct with large penumbra as detailed above. 3. Advanced intracranial atherosclerosis including moderate bilateral ICA stenoses, a severe basilar artery stenosis, and severe bilateral proximal PCA stenoses. 4. Cervical carotid atherosclerosis without significant stenosis. 5. Severe proximal left vertebral  artery stenosis. Emergent findings were communicated to Dr. Selina Cooley at 6:35 pm on 09/11/2020 by text page via the [facility] messaging system. Electronically Signed   By: Sebastian Ache M.D.   On: 09/11/2020 18:51   CT Maxillofacial Wo Contrast  Result Date: 09/11/2020 CLINICAL DATA:  Facial trauma. EXAM: CT MAXILLOFACIAL WITHOUT CONTRAST TECHNIQUE: Multidetector CT imaging of the maxillofacial structures was performed. Multiplanar CT image reconstructions were also generated. COMPARISON:  Head CT 09/27/2019 FINDINGS: Osseous: No acute fracture, mandibular dislocation, or destructive osseous process. Orbits: Bilateral cataract extraction and proptosis. No orbital hematoma or mass. Sinuses: Paranasal sinuses and mastoid air cells are clear. Mild rightward nasal septal deviation. Soft tissues: Punctate left parotid calcification.  Atherosclerosis. Limited intracranial: More fully evaluated on separate head CT. IMPRESSION: No acute maxillofacial fracture. Electronically Signed   By: Sebastian Ache M.D.   On: 09/11/2020 18:55      HISTORY OF PRESENT ILLNESS Andre Adams is a 87 y.o. male with PMH significant for DM2, CKD stage 3a, HTN, HL, hx a fib off AC 2/2 hx GIB and frequent falls who presented after fall and L sided weakness at ALF. LKW 1445, was walking with his walker down the hall, and fell to the floor. Noted to have flaccid L side arm/face/leg. Nonverbal. All are new deficits. BIB EMS and activated as code stroke. NIHSS =  20. CTH NAICP. tPA not administered 2/2 fall with head trauma. CTA showed prox R M2 occlusion. CTP with R MCA core infarct 40ml and large penumbra w/ mismatch 144 ml. Risks and benefits of thrombectomy were discussed with his wife by phone including ~10% risk of hemorrhage from the procedure. She gave informed consent to proceed. Dr. Corliss Skains was notified and patient was taken for neurointervention.   At baseline patient has mild cognitive impairment and walks with walker mRS = 3.   Above  CNS imaging personally reviewed   Additional imaging performed:   CT maxillofacial: no fracture CT c spine: advanced DDD, no acute cervical spine fracture CXR: diffuse interstitial prominence underlying pulm edema vs atypical infection Pelvis XR single view: no fracture XR left wrist: Revisualization of sequela of prior impacted fracture of the distal radius and ulna. Evaluation for superimposed acute fracture is limited due to osteopenia and underlying chronic osseous remodeling. No definitive superimposed acute fracture is noted. If persistent clinical concern for scaphoid fracture, recommend immobilization and follow-up radiographs in 2 weeks versus MRI.   HOSPITAL COURSE Andre Adams is a 87 y.o. male with history of DM2, CKD stage 3a, HTN, HL, hx a fib off AC 2/2 hx GIB, mild cognitive impairment and frequent falls who presented after a fall, non verbal and L sided weakness at ALF. He did not receive IV t-PA due to head trauma with fall. The pt was taken to IR for intervention for thrombectomy Rt M2 lesion.   #Stroke: Rt MCA large infarct due to right M2 occlusion status post IR, embolic, likely due to atrial fibrillation not on anticoagulation. CT Head - No evidence of acute intracranial abnormality. CTA H&N - Proximal right M2 occlusion. Advanced intracranial atherosclerosis including moderate bilateral ICA stenoses, a severe basilar artery stenosis, and severe bilateral proximal PCA stenoses. CTP 40/144 cc, positive for penumbra MRI brain - right MCA large infarct with petechial hemorrhage MRA head - patent right M2 2D Echo EF 65-70% LDL - 100 HgbA1c 7.8 VTE prophylaxis -Lovenox No antithrombotics prior to admission, was on aspirin 325. Now in comfort care Ongoing aggressive stroke risk factor management Therapy recommendations:  SNF, however palliative care consulted on 09/22/20 due to ongoing dysphagia and difficulty managing secretions. The patients wife does not feel like a  peg tube would be in accordance with his goals of care. Palliative on board, now in full comfort care.  Disposition:  residential hospice once bed available   #Chronic Afib Was on Coumadin in the past Coumadin discontinued in 10/2019 due to GI bleeding Follow-up with cardiology, wife and pt declined watchman device  was on heparin for treatment of atrial fibrillation and considered AC with eliquis later but now on comfort care measures   #Hx of GIB Per discharge documentation on 9/21, he has a history of a GI Bleed that occurred in August of 2021 ( no source found on colonoscopy did show multiple submucosal polyps) followed by large volume hematochezia on 10/26/19. GI was consulted and he underwent colonoscopy on 10/29/19 which revealed large fecal impaction with underlying deep ulcers in the rectum (nonbleeding) consistent with stercoral ulcers. This was thought to be the cause of the bleeding. - Do not feel GI findings are a contraindication for anticoagulation - however, pt now in comfort care measures   #Upper Airway Secretions #Cough Per nursing patient has had difficulty coughing up his secretions - Pulmonary toilet: Albuterol treatments Q4  + OOB to chair + Percussion vest - CXR  09/22/20- No significant change since 09/12/2020. Low lung volumes with coarse chronic interstitial opacities - Now in comfort care   #Hypertension Pressures stable. He was on metoprolol and doxazosin   #Hyperlipidemia LDL 100, was started on Lipitor 40 mg QD this admission, now in comfort care    #Post Stroke Dysphagia He failed bedside swallow evaluation and also failed evaluation with ST who saw him for the first time on the 20th post extubation. They have not attempted an MBS yet and feel that the prognosis for swallow improvement is poor. The patients wife does not want him to have a peg tube as she feels he would not have wanted to live like that. Thus palliative care was consulted on 09/22/20, and now in  comfort care    #DMII He has a history of diabetes and was on Lantus + Novolog at home. Are treating hyperglycemia with SSI this admission. Hemoglobin A1c for stroke screening is 7.8, uncontrolled    Other Active Problems Code status - DNR CKD - stage 3b   Chronic fractures related to fall - hip, pelvic and LEFT radius and ulna fxs Elevated MCV - B12 elevated, folate 27.2  DISCHARGE EXAM Blood pressure 131/72, pulse (!) 156, temperature 99.6 F (37.6 C), temperature source Oral, resp. rate (!) 21, height 5\' 7"  (1.702 m), weight 66.9 kg, SpO2 (!) 83 %.  Limited exam due to comfort care measures. Mild respiratory distress due to tachypnea. He did not open eyes on voice. His eyes slight open position, still has left proptosis. No spontaneous movement of all extremities.  DISCHARGE PLAN Disposition:  residential hospice Continue comfort care measures  32 minutes were spent preparing discharge.  , MD PhD Stroke Neurology 09/26/2020 12:10 PM

## 2020-09-26 NOTE — TOC Transition Note (Addendum)
Transition of Care (TOC) - CM/SW Discharge Note  Nursing to call report to hospice home [phone].  Patient Details  Name: Andre Adams MRN: [id number] Date of Birth: Aug 20, 1933  Transition of Care [facility]) CM/SW Contact:  Bess Kinds, RN Phone Number: [phone] 09/26/2020, 11:59 AM   Clinical Narrative:     Notified by Tomma Lightning at [facility] [place] that patient has a bed for today. Wife to complete paperwork. Dr. Roda Shutters notified, discharge order and summary pending. Signed DNR to be completed by palliative. PTAR to be arranged. Spoke with wife, Steward Drone, she requests bedside nurse to give her a call when PTAR arrives.  Final next level of care: Home w Hospice Care Barriers to Discharge: No Barriers Identified   Patient Goals and CMS Choice Patient states their goals for this hospitalization and ongoing recovery are:: [facility].gov Compare Post Acute Care list provided to:: Patient Represenative (must comment) Choice offered to / list presented to : Spouse  Discharge Placement                       Discharge Plan and Services     Post Acute Care Choice: Hospice          DME Arranged: N/A DME Agency: NA       HH Arranged: NA HH Agency: NA        Social Determinants of Health (SDOH) Interventions     Readmission Risk Interventions No flowsheet data found.

## 2020-09-26 NOTE — Progress Notes (Signed)
Palliative Medicine Inpatient Follow Up Note   HPI: 87 y.o. male  with past medical history of DM2, HTN, HL, a fib, GIB, frequent falls. admitted on 09/11/2020 with fall and L sided weakness. Workup revealed large infarct resulting in significant cognitive and physcal deficits. He was intubated 7/23-7/29. He is nonverbal, severe dysphagia, unable to control secretions, he does not show interest in po intake, continuously aspirating. Per discussion with attending and spouse- he would not want PEG. Palliative medicine consulted for Andre Adams.  Today's Discussion (09/26/2020):  *Please note that this is a verbal dictation therefore any spelling or grammatical errors are due to the "[employer]" system interpretation.  Chart reviewed.  I met with patients bedside RN, Ubaldo Glassing this morning.  She shares that there are no concerns from her perspective today and the morphine liberalization has helped his symptoms management.  Upon assessment this morning, Andre Adams appears to be in no distress. He is resting comfortably. He is not responsive when I gentle tap him and speak his name.  There is no family present at bedside this morning.  The PMT will continue to offer ongoing support [facility].  Objective Assessment: Vital Signs Vitals:   09/25/20 0444 09/26/20 0433  BP: (!) 131/91 131/72  Pulse: (!) 112 (!) 156  Resp: 17 (!) 21  Temp: 98 F (36.7 C) 99.6 F (37.6 C)  SpO2: 99% (!) 83%    Intake/Output Summary (Last 24 hours) at 09/26/2020 0716 Last data filed at 09/26/2020 0435 Gross per 24 hour  Intake 0 ml  Output 775 ml  Net -775 ml    Last Weight  Most recent update: 09/23/2020  3:47 AM    Weight  66.9 kg (147 lb 7.8 oz)            Gen:  Very frail elderly M HEENT: Drymucous membranes CV: Irregular rate and rhythm,  PULM:  On RA ABD: soft/nontender, (+) bruising on abdomen EXT: Ecchymosis on all extremities Neuro: Somnolent oriented to  self  SUMMARY OF RECOMMENDATIONS   DNAR/DNI  Comfort care   Medications per MAR, morphine dose PRN added yesterday   Awaiting a bed at [place] for inpatient hospice   Ongoing PMT support  Time Spent: 25 Greater than 50% of the time was spent in counseling and coordination of care ______________________________________________________________________________________ [facility] Team Team Cell Phone: [phone] Please utilize secure chat with additional questions, if there is no response within 30 minutes please call the above phone number  Palliative Medicine Team providers are available by phone from 7am to 7pm daily and can be reached through the team cell phone.  Should this patient require assistance outside of these hours, please call the patient's attending physician.

## 2020-10-21 DEATH — deceased

## 2021-01-04 ENCOUNTER — Other Ambulatory Visit: Payer: Self-pay

## 2021-01-04 NOTE — Patient Outreach (Signed)
[facility]) Care Management  01/04/2021  ESCHOL AUXIER 09-09-1933 [id number]   Telephone outreach to patient to obtain mRS was successfully completed. MRS= 6   Vanice Sarah [facility] Care Management Assistant
